# Patient Record
Sex: Female | Born: 1980 | Race: White | Hispanic: Yes | Marital: Married | Smoking: Never smoker
Health system: Southern US, Community
[De-identification: ages and names within clinical notes are randomized; demographics above are authoritative.]

## PROBLEM LIST (undated history)

## (undated) DIAGNOSIS — Z973 Presence of spectacles and contact lenses: Secondary | ICD-10-CM

## (undated) DIAGNOSIS — A64 Unspecified sexually transmitted disease: Secondary | ICD-10-CM

## (undated) DIAGNOSIS — D259 Leiomyoma of uterus, unspecified: Secondary | ICD-10-CM

## (undated) DIAGNOSIS — E041 Nontoxic single thyroid nodule: Secondary | ICD-10-CM

## (undated) DIAGNOSIS — E282 Polycystic ovarian syndrome: Secondary | ICD-10-CM

## (undated) DIAGNOSIS — Z803 Family history of malignant neoplasm of breast: Secondary | ICD-10-CM

## (undated) DIAGNOSIS — Z923 Personal history of irradiation: Secondary | ICD-10-CM

## (undated) DIAGNOSIS — F419 Anxiety disorder, unspecified: Secondary | ICD-10-CM

## (undated) DIAGNOSIS — R87619 Unspecified abnormal cytological findings in specimens from cervix uteri: Secondary | ICD-10-CM

## (undated) HISTORY — DX: Family history of malignant neoplasm of breast: Z80.3

## (undated) HISTORY — DX: Anxiety disorder, unspecified: F41.9

## (undated) HISTORY — DX: Unspecified sexually transmitted disease: A64

## (undated) HISTORY — DX: Polycystic ovarian syndrome: E28.2

## (undated) HISTORY — DX: Nontoxic single thyroid nodule: E04.1

## (undated) HISTORY — DX: Unspecified abnormal cytological findings in specimens from cervix uteri: R87.619

## (undated) HISTORY — PX: COLPOSCOPY: SHX161

## (undated) HISTORY — PX: CERVICAL BIOPSY  W/ LOOP ELECTRODE EXCISION: SUR135

---

## 1999-07-24 DIAGNOSIS — R87619 Unspecified abnormal cytological findings in specimens from cervix uteri: Secondary | ICD-10-CM

## 1999-07-24 HISTORY — DX: Unspecified abnormal cytological findings in specimens from cervix uteri: R87.619

## 2014-05-04 ENCOUNTER — Ambulatory Visit (INDEPENDENT_AMBULATORY_CARE_PROVIDER_SITE_OTHER): Payer: Managed Care, Other (non HMO) | Admitting: Gynecology

## 2014-05-04 ENCOUNTER — Encounter: Payer: Self-pay | Admitting: Gynecology

## 2014-05-04 VITALS — BP 110/70 | HR 60 | Resp 16 | Ht 66.0 in | Wt 143.0 lb

## 2014-05-04 DIAGNOSIS — Z124 Encounter for screening for malignant neoplasm of cervix: Secondary | ICD-10-CM

## 2014-05-04 DIAGNOSIS — Z0000 Encounter for general adult medical examination without abnormal findings: Secondary | ICD-10-CM

## 2014-05-04 DIAGNOSIS — Z01419 Encounter for gynecological examination (general) (routine) without abnormal findings: Secondary | ICD-10-CM

## 2014-05-04 LAB — POCT URINALYSIS DIPSTICK
LEUKOCYTES UA: NEGATIVE
PH UA: 5
Urobilinogen, UA: NEGATIVE

## 2014-05-04 NOTE — Progress Notes (Addendum)
33 y.o. Single Hispanic female   No obstetric history on file. here for annual exam. Pt is currently sexually active.  Pt recently moved from [facility].  Current partner for 22m, but not sexually active.  Pt had been on ocp in past, not interested in restarting.  Pt was diagnosed with PCOS based on PUS restarted ocp but stopped due to PMS.  Cycles have been regular now, Q35d, light flow.  Pt believes she gets monthly yeast infection before flow, but resolves shortly after, does not respond to otc rx.  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  Pt does not have pain now but reports a discharge.  Pt reports LEEP at 32, but pathology was normal tissue and all pap's normal since.  Patient's last menstrual period was 04/11/2014.          Sexually active: Yes.    The current method of family planning is condoms most of the time.    Exercising: Yes.    cardio,weights,yoga 3-4x/wk Last pap: 01/2013-WNL Abnormal pap: Age 56's had colpo bx and Leep done; normal ever since. Alcohol: 7-10 Drinks/wk Tobacco: no BSE: no   Labs: Work ; Urine: Negative     There are no preventive care reminders to display for this patient.  Family History  Problem Relation Age of Onset  . Diabetes Mother   . Heart attack Paternal Grandfather   . Hypertension Father     There are no active problems to display for this patient.   Past Medical History  Diagnosis Date  . Anxiety   . PCOS (polycystic ovarian syndrome)     Past Surgical History  Procedure Laterality Date  . Cervical biopsy  w/ loop electrode excision  Age 56     Paps have been normal eversince   . Colposcopy      Allergies: Penicillins  Current Outpatient Prescriptions  Medication Sig Dispense Refill  . ALPRAZolam (XANAX) 0.5 MG tablet as needed.      . Lactobacillus (ACIDOPHILUS PO) Take by mouth.       No current facility-administered medications for this visit.    ROS: Pertinent items are noted in HPI.  Exam:    BP 110/70  Resp  16  Wt 143 lb (64.864 kg)  LMP 04/11/2014 Weight change: @WEIGHTCHANGE @ Last 3 height recordings:  Ht Readings from Last 3 Encounters:  No data found for Ht   General appearance: alert, cooperative and appears stated age Head: Normocephalic, without obvious abnormality, atraumatic Neck: no adenopathy, no carotid bruit, no JVD, supple, symmetrical, trachea midline and thyroid not enlarged, symmetric, no tenderness/mass/nodules Lungs: clear to auscultation bilaterally Breasts: normal appearance, no masses or tenderness Heart: regular rate and rhythm, S1, S2 normal, no murmur, click, rub or gallop Abdomen: soft, non-tender; bowel sounds normal; no masses,  no organomegaly Extremities: extremities normal, atraumatic, no cyanosis or edema Skin: Skin color, texture, turgor normal. No rashes or lesions Lymph nodes: Cervical, supraclavicular, and axillary nodes normal. no inguinal nodes palpated Neurologic: Grossly normal   Pelvic: External genitalia:  normal escutcheon              Urethra: normal appearing urethra with no masses, tenderness or lesions              Bartholins and Skenes: Bartholin's, Urethra, Skene's normal                 Vagina: normal appearing vagina with normal color and discharge, no lesions  Cervix: scarring c/w LEEP              Pap taken: Yes.          Bimanual Exam:  Uterus:  uterus is normal size, shape, consistency and nontender                                      Adnexa:    normal adnexa in size, nontender and no masses                                      Rectovaginal: Confirms                                      Anus:  normal sphincter tone, no lesions       1. Laboratory exam ordered as part of routine general medical examination  - POCT Urinalysis Dipstick  2. Encounter for routine gynecological examination pap smear counseled on breast self exam, STD prevention, adequate intake of calcium and vitamin D, diet and exercise, no STD  concerns Anovulation after long use of ocp discussed, now with normal cycles, ok with condoms only Vaginal discharge progesterone affect-recommend rto when sx of yeast return return annually or prn   3. Screening for cervical cancer Guidelines reviewed PAP with HRHPV done   An After Visit Summary was printed and given to the patient.   Records received and reviewed-limited 2012 PAP NL +HR HPV 2013 PAP NL HPV NOT DONE 2014 PAP NL +HR HPV 18 NO RECORD RE C&B IF DONE PUS: 2 pedunculated fibroids 2.3x2.4 and 1.0x1.1 right fundal, multiple ovarian follicles Records scanned in

## 2014-05-05 LAB — IPS PAP TEST WITH HPV

## 2014-05-07 ENCOUNTER — Telehealth: Payer: Self-pay | Admitting: *Deleted

## 2014-05-07 NOTE — Telephone Encounter (Signed)
Left Message To Call Back  

## 2014-05-07 NOTE — Telephone Encounter (Signed)
Message copied by Alfonzo Feller on Fri May 07, 2014 12:55 PM ------      Message from: Elveria Rising      Created: Fri May 07, 2014 12:10 PM       Pap normal, no HPV, recall 2, pls inform no yeast noted on pap ------

## 2014-05-10 NOTE — Telephone Encounter (Signed)
Patient notified see result note 

## 2014-10-01 ENCOUNTER — Telehealth: Payer: Self-pay | Admitting: Obstetrics and Gynecology

## 2014-10-01 NOTE — Telephone Encounter (Signed)
Spoke with patient. She was last seen in our office 05/04/14 by Dr. Charlies Constable as new patient. Patient declined ocp's at this time, but would like to restart at this time. She has used Lo-ovral in the past and would like to continue but is open to other options for her as well.   Advised patient will need office visit to discuss with provider and instructions for new start OCP.Patient agreeable. Office visit scheduled for 10/05/14 at 1115.  Routing to provider for final review. Patient agreeable to disposition. Will close encounter

## 2014-10-01 NOTE — Telephone Encounter (Signed)
Pt seen in October. Would like to be on LoLo.

## 2014-10-05 ENCOUNTER — Encounter: Payer: Self-pay | Admitting: Certified Nurse Midwife

## 2014-10-05 ENCOUNTER — Ambulatory Visit (INDEPENDENT_AMBULATORY_CARE_PROVIDER_SITE_OTHER): Payer: Managed Care, Other (non HMO) | Admitting: Certified Nurse Midwife

## 2014-10-05 VITALS — BP 110/68 | HR 68 | Resp 16 | Ht 66.0 in | Wt 152.0 lb

## 2014-10-05 DIAGNOSIS — Z3009 Encounter for other general counseling and advice on contraception: Secondary | ICD-10-CM

## 2014-10-05 DIAGNOSIS — Z30011 Encounter for initial prescription of contraceptive pills: Secondary | ICD-10-CM

## 2014-10-05 MED ORDER — NORETHIN ACE-ETH ESTRAD-FE 1-20 MG-MCG(24) PO TABS: 1.0000 | ORAL_TABLET | Freq: Every day | ORAL | Status: DC

## 2014-10-05 NOTE — Patient Instructions (Signed)

## 2014-10-05 NOTE — Progress Notes (Addendum)
34 y.o. Single g0p0 Hispanic or Latino female presents to discuss need for contraception.  Previous methods used include oral contraceptives (estrogen/progesterone) and desires same. She is not interested in anything that requires removal. She has taken OCP in past without any problems. Now in stable relationship and both have exams and STD screening, so contraception now desired. Plans condom use also. Denies history of migraine headaches, personal or family history of DVT or stroke. Non smoker. Denies weight gain or problems with nausea with OCP use. Last aex here 05/04/14. No health issues today.   O:   Healthy WDWN female Affect normal             Orientation x 3 Pap smear 05/04/14 normal, history of LEEP at 19 with all normal pap smears   A: Healthy female with no contraindications to OCP use AEX up to date no health changes. Oral contraception desired, previous use with out problems  Plan:Discussed risks and benefits of OCP, warning signs and symptoms with use and need to advise if occurs. Discussed 3 month adjustment period after starting OCP. Patient aware. Plans Condoms use also. Discussed starting OCP on first day of cycle, instructions given. Rx Loestrin 24 Fe see order.  Rv 3 months for evaluation.   20 minutes of face to face tim regarding contraception options

## 2014-10-06 NOTE — Progress Notes (Signed)
Please document time.  Reviewed personally.  Felipa Emory, MD.

## 2015-03-03 ENCOUNTER — Telehealth: Payer: Self-pay | Admitting: Certified Nurse Midwife

## 2015-03-03 NOTE — Telephone Encounter (Addendum)
Patient is asking for three refills until her aex. Confirmed pharmacy with patient. Last seen 10/05/14.

## 2015-03-03 NOTE — Telephone Encounter (Signed)
Patient needs 3 months to last her until she comes in to see Ms. Debbie in [place]. She is aware that rx is at the pharmacy and she can get 3 months at a time. Patient said she will call them and if she has any problems or issues she will give [place] a call back.  Routed to provider for review, encounter closed.

## 2015-03-03 NOTE — Telephone Encounter (Addendum)
10/05/14 Loestrin 24 FE # 3 packs/2 rfs was sent to [facility]. Called and s/w associate from [facility] they do have rx and patient can get her rx at 3 months at a time. She was getting one month at a time.  Left Message To Call Back

## 2015-05-19 ENCOUNTER — Ambulatory Visit (INDEPENDENT_AMBULATORY_CARE_PROVIDER_SITE_OTHER): Payer: Managed Care, Other (non HMO) | Admitting: Certified Nurse Midwife

## 2015-05-19 ENCOUNTER — Encounter: Payer: Self-pay | Admitting: Certified Nurse Midwife

## 2015-05-19 VITALS — BP 108/70 | HR 70 | Resp 14 | Ht 66.0 in | Wt 145.0 lb

## 2015-05-19 DIAGNOSIS — Z01419 Encounter for gynecological examination (general) (routine) without abnormal findings: Secondary | ICD-10-CM

## 2015-05-19 NOTE — Patient Instructions (Signed)
General topics  Next pap or exam is  due in 1 year Take a Women's multivitamin Take 1200 mg. of calcium daily - prefer dietary If any concerns in interim to call back  Breast Self-Awareness Practicing breast self-awareness may pick up problems early, prevent significant medical complications, and possibly save your life. By practicing breast self-awareness, you can become familiar with how your breasts look and feel and if your breasts are changing. This allows you to notice changes early. It can also offer you some reassurance that your breast health is good. One way to learn what is normal for your breasts and whether your breasts are changing is to do a breast self-exam. If you find a lump or something that was not present in the past, it is best to contact your caregiver right away. Other findings that should be evaluated by your caregiver include nipple discharge, especially if it is bloody; skin changes or reddening; areas where the skin seems to be pulled in (retracted); or new lumps and bumps. Breast pain is seldom associated with cancer (malignancy), but should also be evaluated by a caregiver. BREAST SELF-EXAM The best time to examine your breasts is 5 7 days after your menstrual period is over.  ExitCare Patient Information 2013 ExitCare[employer] LLC.   Exercise to Stay Healthy Exercise helps you become and stay healthy. EXERCISE IDEAS AND TIPS Choose exercises that:  You enjoy.  Fit into your day. You do not need to exercise really hard to be healthy. You can do exercises at a slow or medium level and stay healthy. You can:  Stretch before and after working out.  Try yoga, Pilates, or tai chi.  Lift weights.  Walk fast, swim, jog, run, climb stairs, bicycle, dance, or rollerskate.  Take aerobic classes. Exercises that burn about 150 calories:  Running 1  miles in 15 minutes.  Playing volleyball for 45 to 60 minutes.  Washing and waxing a car for 45 to 60  minutes.  Playing touch football for 45 minutes.  Walking 1  miles in 35 minutes.  Pushing a stroller 1  miles in 30 minutes.  Playing basketball for 30 minutes.  Raking leaves for 30 minutes.  Bicycling 5 miles in 30 minutes.  Walking 2 miles in 30 minutes.  Dancing for 30 minutes.  Shoveling snow for 15 minutes.  Swimming laps for 20 minutes.  Walking up stairs for 15 minutes.  Bicycling 4 miles in 15 minutes.  Gardening for 30 to 45 minutes.  Jumping rope for 15 minutes.  Washing windows or floors for 45 to 60 minutes. Document Released: 08/11/2010 Document Revised: 10/01/2011 Document Reviewed: 08/11/2010 ExitCare Patient Information 2013 [employer]Care[employer].   Other topics ( that may be useful information):    Sexually Transmitted Disease Sexually transmitted disease (STD) refers to any infection that is passed from person to person during sexual activity. This may happen by way of saliva, semen, blood, vaginal mucus, or urine. Common STDs include:  Gonorrhea.  Chlamydia.  Syphilis.  HIV/AIDS.  Genital herpes.  Hepatitis B and C.  Trichomonas.  Human papillomavirus (HPV).  Pubic lice. CAUSES  An STD may be spread by bacteria, virus, or parasite. A person can get an STD by:  Sexual intercourse with an infected person.  Sharing sex toys with an infected person.  Sharing needles with an infected person.  Having intimate contact with the genitals, mouth, or rectal areas of an infected person. SYMPTOMS  Some people may not have any symptoms, but   they can still pass the infection to others. Different STDs have different symptoms. Symptoms include:  Painful or bloody urination.  Pain in the pelvis, abdomen, vagina, anus, throat, or eyes.  Skin rash, itching, irritation, growths, or sores (lesions). These usually occur in the genital or anal area.  Abnormal vaginal discharge.  Penile discharge in men.  Soft, flesh-colored skin growths in the  genital or anal area.  Fever.  Pain or bleeding during sexual intercourse.  Swollen glands in the groin area.  Yellow skin and eyes (jaundice). This is seen with hepatitis. DIAGNOSIS  To make a diagnosis, your caregiver may:  Take a medical history.  Perform a physical exam.  Take a specimen (culture) to be examined.  Examine a sample of discharge under a microscope.  Perform blood test TREATMENT   Chlamydia, gonorrhea, trichomonas, and syphilis can be cured with antibiotic medicine.  Genital herpes, hepatitis, and HIV can be treated, but not cured, with prescribed medicines. The medicines will lessen the symptoms.  Genital warts from HPV can be treated with medicine or by freezing, burning (electrocautery), or surgery. Warts may come back.  HPV is a virus and cannot be cured with medicine or surgery.However, abnormal areas may be followed very closely by your caregiver and may be removed from the cervix, vagina, or vulva through office procedures or surgery. If your diagnosis is confirmed, your recent sexual partners need treatment. This is true even if they are symptom-free or have a negative culture or evaluation. They should not have sex until their caregiver says it is okay. HOME CARE INSTRUCTIONS  All sexual partners should be informed, tested, and treated for all STDs.  Take your antibiotics as directed. Finish them even if you start to feel better.  Only take over-the-counter or prescription medicines for pain, discomfort, or fever as directed by your caregiver.  Rest.  Eat a balanced diet and drink enough fluids to keep your urine clear or pale yellow.  Do not have sex until treatment is completed and you have followed up with your caregiver. STDs should be checked after treatment.  Keep all follow-up appointments, Pap tests, and blood tests as directed by your caregiver.  Only use latex condoms and water-soluble lubricants during sexual activity. Do not use  petroleum jelly or oils.  Avoid alcohol and illegal drugs.  Get vaccinated for HPV and hepatitis. If you have not received these vaccines in the past, talk to your caregiver about whether one or both might be right for you.  Avoid risky sex practices that can break the skin. The only way to avoid getting an STD is to avoid all sexual activity.Latex condoms and dental dams (for oral sex) will help lessen the risk of getting an STD, but will not completely eliminate the risk. SEEK MEDICAL CARE IF:   You have a fever.  You have any new or worsening symptoms. Document Released: 09/29/2002 Document Revised: 10/01/2011 Document Reviewed: 10/06/2010 [facility] Patient Information 2013 [employer].    Domestic Abuse You are being battered or abused if someone close to you hits, pushes, or physically hurts you in any way. You also are being abused if you are forced into activities. You are being sexually abused if you are forced to have sexual contact of any kind. You are being emotionally abused if you are made to feel worthless or if you are constantly threatened. It is important to remember that help is available. No one has the right to abuse you. PREVENTION OF FURTHER  ABUSE  Learn the warning signs of danger. This varies with situations but may include: the use of alcohol, threats, isolation from friends and family, or forced sexual contact. Leave if you feel that violence is going to occur.  If you are attacked or beaten, report it to the police so the abuse is documented. You do not have to press charges. The police can protect you while you or the attackers are leaving. Get the officer's name and badge number and a copy of the report.  Find someone you can trust and tell them what is happening to you: your caregiver, a nurse, clergy member, close friend or family member. Feeling ashamed is natural, but remember that you have done nothing wrong. No one deserves abuse. Document Released:  07/06/2000 Document Revised: 10/01/2011 Document Reviewed: 09/14/2010 ExitCare Patient Information 2013 [employer]Care[employer] LLC.    How Much is Too Much Alcohol? Drinking too much alcohol can cause injury, accidents, and health problems. These types of problems can include:   Car crashes.  Falls.  Family fighting (domestic violence).  Drowning.  Fights.  Injuries.  Burns.  Damage to certain organs.  Having a baby with birth defects. ONE DRINK CAN BE TOO MUCH WHEN YOU ARE:  Working.  Pregnant or breastfeeding.  Taking medicines. Ask your doctor.  Driving or planning to drive. If you or someone you know has a drinking problem, get help from a doctor.  Document Released: 05/05/2009 Document Revised: 10/01/2011 Document Reviewed: 05/05/2009 ExitCare Patient Information 2013 ExitCare[employer] LLC.   Smoking Hazards Smoking cigarettes is extremely bad for your health. Tobacco smoke has over 200 known poisons in it. There are over 60 chemicals in tobacco smoke that cause cancer. Some of the chemicals found in cigarette smoke include:   Cyanide.  Benzene.  Formaldehyde.  Methanol (wood alcohol).  Acetylene (fuel used in welding torches).  Ammonia. Cigarette smoke also contains the poisonous gases nitrogen oxide and carbon monoxide.  Cigarette smokers have an increased risk of many serious medical problems and Smoking causes approximately:  90% of all lung cancer deaths in men.  80% of all lung cancer deaths in women.  90% of deaths from chronic obstructive lung disease. Compared with nonsmokers, smoking increases the risk of:  Coronary heart disease by 2 to 4 times.  Stroke by 2 to 4 times.  Men developing lung cancer by 23 times.  Women developing lung cancer by 13 times.  Dying from chronic obstructive lung diseases by 12 times.  . Smoking is the most preventable cause of death and disease in our society.  WHY IS SMOKING ADDICTIVE?  Nicotine is the chemical  agent in tobacco that is capable of causing addiction or dependence.  When you smoke and inhale, nicotine is absorbed rapidly into the bloodstream through your lungs. Nicotine absorbed through the lungs is capable of creating a powerful addiction. Both inhaled and non-inhaled nicotine may be addictive.  Addiction studies of cigarettes and spit tobacco show that addiction to nicotine occurs mainly during the teen years, when young people begin using tobacco products. WHAT ARE THE BENEFITS OF QUITTING?  There are many health benefits to quitting smoking.   Likelihood of developing cancer and heart disease decreases. Health improvements are seen almost immediately.  Blood pressure, pulse rate, and breathing patterns start returning to normal soon after quitting. QUITTING SMOKING   American Lung Association - 1-800-LUNGUSA  American Cancer Society - 1-800-ACS-2345 Document Released: 08/16/2004 Document Revised: 10/01/2011 Document Reviewed: 04/20/2009 ExitCare Patient Information 2013 ExitCare,   LLC.   Stress Management Stress is a state of physical or mental tension that often results from changes in your life or normal routine. Some common causes of stress are:  Death of a loved one.  Injuries or severe illnesses.  Getting fired or changing jobs.  Moving into a new home. Other causes may be:  Sexual problems.  Business or financial losses.  Taking on a large debt.  Regular conflict with someone at home or at work.  Constant tiredness from lack of sleep. It is not just bad things that are stressful. It may be stressful to:  Win the lottery.  Get married.  Buy a new car. The amount of stress that can be easily tolerated varies from person to person. Changes generally cause stress, regardless of the types of change. Too much stress can affect your health. It may lead to physical or emotional problems. Too little stress (boredom) may also become stressful. SUGGESTIONS TO  REDUCE STRESS:  Talk things over with your family and friends. It often is helpful to share your concerns and worries. If you feel your problem is serious, you may want to get help from a professional counselor.  Consider your problems one at a time instead of lumping them all together. Trying to take care of everything at once may seem impossible. List all the things you need to do and then start with the most important one. Set a goal to accomplish 2 or 3 things each day. If you expect to do too many in a single day you will naturally fail, causing you to feel even more stressed.  Do not use alcohol or drugs to relieve stress. Although you may feel better for a short time, they do not remove the problems that caused the stress. They can also be habit forming.  Exercise regularly - at least 3 times per week. Physical exercise can help to relieve that "uptight" feeling and will relax you.  The shortest distance between despair and hope is often a good night's sleep.  Go to bed and get up on time allowing yourself time for appointments without being rushed.  Take a short "time-out" period from any stressful situation that occurs during the day. Close your eyes and take some deep breaths. Starting with the muscles in your face, tense them, hold it for a few seconds, then relax. Repeat this with the muscles in your neck, shoulders, hand, stomach, back and legs.  Take good care of yourself. Eat a balanced diet and get plenty of rest.  Schedule time for having fun. Take a break from your daily routine to relax. HOME CARE INSTRUCTIONS   Call if you feel overwhelmed by your problems and feel you can no longer manage them on your own.  Return immediately if you feel like hurting yourself or someone else. Document Released: 01/02/2001 Document Revised: 10/01/2011 Document Reviewed: 08/25/2007 ExitCare Patient Information 2013 ExitCare, LLC.   

## 2015-05-19 NOTE — Progress Notes (Signed)
34 y.o. G0P0000 Single  Hispanic Fe here for annual exam. Periods normal, no issues. Has stopped OCP due to not sexually active now and did not like the way it may her feel. Sees PCP prn. Period is due now, so some PMS symptoms. Has labs with work. No health issues today  Patient's last menstrual period was 04/15/2015.          Sexually active: No.  The current method of family planning is condoms Every time.    Exercising: Yes.    Cardio, yoga, 3 x weekly Smoker:  no  Health Maintenance: Pap:  05/04/14 Neg. HR HPV:neg MMG:  Never Self Breast Check: No TDaP:  2013  Labs: PCP   reports that she has never smoked. She has never used smokeless tobacco. She reports that she drinks about 3.5 - 5.0 oz of alcohol per week. She reports that she does not use illicit drugs.  Past Medical History  Diagnosis Date  . Anxiety   . PCOS (polycystic ovarian syndrome)   . Abnormal Pap smear of cervix 2001    Past Surgical History  Procedure Laterality Date  . Cervical biopsy  w/ loop electrode excision  Age 51     Paps have been normal eversince   . Colposcopy      Current Outpatient Prescriptions  Medication Sig Dispense Refill  . ALPRAZolam (XANAX) 0.5 MG tablet as needed.    . Lactobacillus (ACIDOPHILUS PO) Take by mouth as needed.      No current facility-administered medications for this visit.    Family History  Problem Relation Age of Onset  . Diabetes Mother   . Heart attack Paternal Grandfather   . Hypertension Father     ROS:  Pertinent items are noted in HPI.  Otherwise, a comprehensive ROS was negative.  Exam:   BP 108/70 mmHg  Pulse 70  Resp 14  Ht 5\' 6"  (1.676 m)  Wt 145 lb (65.772 kg)  BMI 23.41 kg/m2  LMP 04/15/2015 Height: 5\' 6"  (167.6 cm) Ht Readings from Last 3 Encounters:  05/19/15 5\' 6"  (1.676 m)  10/05/14 5\' 6"  (1.676 m)  05/04/14 5\' 6"  (1.676 m)    General appearance: alert, cooperative and appears stated age Head: Normocephalic, without obvious  abnormality, atraumatic Neck: no adenopathy, supple, symmetrical, trachea midline and thyroid normal to inspection and palpation Lungs: clear to auscultation bilaterally Breasts: normal appearance, no masses or tenderness, No nipple retraction or dimpling, No nipple discharge or bleeding, No axillary or supraclavicular adenopathy Heart: regular rate and rhythm Abdomen: soft, non-tender; no masses,  no organomegaly Extremities: extremities normal, atraumatic, no cyanosis or edema Skin: Skin color, texture, turgor normal. No rashes or lesions Lymph nodes: Cervical, supraclavicular, and axillary nodes normal. No abnormal inguinal nodes palpated Neurologic: Grossly normal   Pelvic: External genitalia:  no lesions              Urethra:  normal appearing urethra with no masses, tenderness or lesions              Bartholin's and Skene's: normal                 Vagina: normal appearing vagina with normal color and discharge, no lesions              Cervix: normal,non tender,no lesions              Pap taken: No. Bimanual Exam:  Uterus:  normal size, contour, position, consistency, mobility, non-tender and  mid position              Adnexa: normal adnexa and no mass, fullness, tenderness               Rectovaginal: Confirms               Anus:  normal sphincter tone, no lesions  Chaperone present: yes   A:  Well Woman with normal exam  Contraception none desired    P:   Reviewed health and wellness pertinent to exam  Discussed possible Nuvaring use if needs contraception again  Pap smear as above not taken   counseled on breast self exam, STD prevention, HIV risk factors and prevention, adequate intake of calcium and vitamin D, diet and exercise  return annually or prn  An After Visit Summary was printed and given to the patient.

## 2015-05-22 NOTE — Progress Notes (Signed)
Reviewed personally.  M. Suzanne Ahren Pettinger, MD.  

## 2016-04-10 ENCOUNTER — Ambulatory Visit: Payer: Managed Care, Other (non HMO) | Admitting: Certified Nurse Midwife

## 2016-04-18 ENCOUNTER — Encounter: Payer: Self-pay | Admitting: Certified Nurse Midwife

## 2016-04-18 ENCOUNTER — Ambulatory Visit: Payer: Managed Care, Other (non HMO) | Admitting: Certified Nurse Midwife

## 2016-04-18 NOTE — Progress Notes (Deleted)
35 y.o. G0P0000 Single  Hispanic Fe here for annual exam.    No LMP recorded.          Sexually active: {yes no:314532}  The current method of family planning is {contraception:315051}.    Exercising: {yes no:314532}  {types:19826} Smoker:  {YES NO:22349}  Health Maintenance: Pap:  05-04-14 neg MMG: none Colonoscopy:  none BMD:   none TDaP:  2013 Shingles: no Pneumonia: no Hep C and HIV: *** Labs: *** Self breast exam:   reports that she has never smoked. She has never used smokeless tobacco. She reports that she drinks about 3.5 - 5.0 oz of alcohol per week . She reports that she does not use drugs.  Past Medical History:  Diagnosis Date  . Abnormal Pap smear of cervix 2001  . Anxiety   . PCOS (polycystic ovarian syndrome)     Past Surgical History:  Procedure Laterality Date  . CERVICAL BIOPSY  W/ LOOP ELECTRODE EXCISION  Age 3    Paps have been normal eversince   . COLPOSCOPY      Current Outpatient Prescriptions  Medication Sig Dispense Refill  . ALPRAZolam (XANAX) 0.5 MG tablet as needed.    . Lactobacillus (ACIDOPHILUS PO) Take by mouth as needed.      No current facility-administered medications for this visit.     Family History  Problem Relation Age of Onset  . Diabetes Mother   . Heart attack Paternal Grandfather   . Hypertension Father     ROS:  Pertinent items are noted in HPI.  Otherwise, a comprehensive ROS was negative.  Exam:   There were no vitals taken for this visit.   Ht Readings from Last 3 Encounters:  05/19/15 5\' 6"  (1.676 m)  10/05/14 5\' 6"  (1.676 m)  05/04/14 5\' 6"  (1.676 m)    General appearance: alert, cooperative and appears stated age Head: Normocephalic, without obvious abnormality, atraumatic Neck: no adenopathy, supple, symmetrical, trachea midline and thyroid {EXAM; THYROID:18604} Lungs: clear to auscultation bilaterally Breasts: {Exam; breast:13139::"normal appearance, no masses or tenderness"} Heart: regular rate  and rhythm Abdomen: soft, non-tender; no masses,  no organomegaly Extremities: extremities normal, atraumatic, no cyanosis or edema Skin: Skin color, texture, turgor normal. No rashes or lesions Lymph nodes: Cervical, supraclavicular, and axillary nodes normal. No abnormal inguinal nodes palpated Neurologic: Grossly normal   Pelvic: External genitalia:  no lesions              Urethra:  normal appearing urethra with no masses, tenderness or lesions              Bartholin's and Skene's: normal                 Vagina: normal appearing vagina with normal color and discharge, no lesions              Cervix: {exam; cervix:14595}              Pap taken: {yes no:314532} Bimanual Exam:  Uterus:  {exam; uterus:12215}              Adnexa: {exam; adnexa:12223}               Rectovaginal: Confirms               Anus:  normal sphincter tone, no lesions  Chaperone present: ***  A:  Well Woman with normal exam  P:   Reviewed health and wellness pertinent to exam  Pap smear as above  {plan;  gyn:5269::"mammogram","pap smear","return annually or prn"}  An After Visit Summary was printed and given to the patient.

## 2016-05-02 ENCOUNTER — Ambulatory Visit: Payer: Managed Care, Other (non HMO) | Admitting: Certified Nurse Midwife

## 2016-06-05 NOTE — Progress Notes (Signed)
35 y.o. G0P0000 Single  Hispanic Fe here for annual exam.  Periods normal, no issues. 28-35 day cycle. PMS is so much better off OCP,  would like to be on something with no hormones and long term . Interested in IUD. No partner change, GC/chlamydia screening only if needed for IUD insertion. Sees PCP for aex and labs, all normal per patient. No health issues today.  Patient's last menstrual period was 05/12/2016 (exact date).          Sexually active: Yes.    The current method of family planning is condoms most of the time.    Exercising: Yes.    cardio & yoga Smoker:  no  Health Maintenance: Pap:  05-04-14 neg HPV HR neg , hx of LEEP MMG:  none Colonoscopy:  none BMD:   none TDaP:  2013 Shingles: no Pneumonia: no Hep C and HIV: HIV 4/17 neg Labs: pcp Self breast exam: not done   reports that she has never smoked. She has never used smokeless tobacco. She reports that she drinks about 3.5 - 5.0 oz of alcohol per week . She reports that she does not use drugs.  Past Medical History:  Diagnosis Date  . Abnormal Pap smear of cervix 2001  . Anxiety   . PCOS (polycystic ovarian syndrome)     Past Surgical History:  Procedure Laterality Date  . CERVICAL BIOPSY  W/ LOOP ELECTRODE EXCISION  Age 49    Paps have been normal eversince   . COLPOSCOPY      Current Outpatient Prescriptions  Medication Sig Dispense Refill  . ALPRAZolam (XANAX) 0.5 MG tablet as needed.    Marland Kitchen BIOTIN PO Take by mouth.    . Multiple Vitamins-Minerals (MULTIVITAMIN PO) Take by mouth daily.    . Probiotic Product (PROBIOTIC PO) Take by mouth daily.     No current facility-administered medications for this visit.     Family History  Problem Relation Age of Onset  . Diabetes Mother   . Heart attack Paternal Grandfather   . Hypertension Father     ROS:  Pertinent items are noted in HPI.  Otherwise, a comprehensive ROS was negative.  Exam:   BP 102/64   Pulse 68   Resp 16   Ht 5' 6.25" (1.683 m)    Wt 146 lb (66.2 kg)   LMP 05/12/2016 (Exact Date)   BMI 23.39 kg/m  Height: 5' 6.25" (168.3 cm) Ht Readings from Last 3 Encounters:  06/06/16 5' 6.25" (1.683 m)  05/19/15 5\' 6"  (1.676 m)  10/05/14 5\' 6"  (1.676 m)    General appearance: alert, cooperative and appears stated age Head: Normocephalic, without obvious abnormality, atraumatic Neck: no adenopathy, supple, symmetrical, trachea midline and thyroid normal to inspection and palpation Lungs: clear to auscultation bilaterally Breasts: normal appearance, no masses or tenderness, No nipple retraction or dimpling, No nipple discharge or bleeding, No axillary or supraclavicular adenopathy Heart: regular rate and rhythm Abdomen: soft, non-tender; no masses,  no organomegaly Extremities: extremities normal, atraumatic, no cyanosis or edema Skin: Skin color, texture, turgor normal. No rashes or lesions Lymph nodes: Cervical, supraclavicular, and axillary nodes normal. No abnormal inguinal nodes palpated Neurologic: Grossly normal   Pelvic: External genitalia:  no lesions              Urethra:  normal appearing urethra with no masses, tenderness or lesions              Bartholin's and Skene's: normal  Vagina: normal appearing vagina with normal color and discharge, no lesions              Cervix: no bleeding following Pap, no cervical motion tenderness and no lesions              Pap taken: Yes.   Bimanual Exam:  Uterus:  normal size, contour, position, consistency, mobility, non-tender              Adnexa: normal adnexa and no mass, fullness, tenderness               Rectovaginal: Confirms               Anus:  normal sphincter tone, no lesions  Chaperone present: yes  A:  Well Woman with normal exam  Contraception condoms interested in IUD  Screening labs  P:   Reviewed health and wellness pertinent to exam  Discussed risks/benefits/insertion/removal and bleeding expectations with Paragard IUD. Discussed bleeding  profile with Mirena. Questions addressed. Discussed will need Cytotec the night before and day of insertion to soften cervix for ease of insertion. Will need to inserted on period day 1-5. Questions addressed. Given information brochure and insertion info to check on. Patient will call if she decides to go forward with IUD. Needs consistent condom use prior to insertion.  Lab: Gc,Chlamydia  Pap smear as above with HPVHR   counseled on breast self exam, family planning choices, adequate intake of calcium and vitamin D, diet and exercise  return annually or prn  An After Visit Summary was printed and given to the patient.

## 2016-06-06 ENCOUNTER — Ambulatory Visit (INDEPENDENT_AMBULATORY_CARE_PROVIDER_SITE_OTHER): Payer: 59 | Admitting: Certified Nurse Midwife

## 2016-06-06 ENCOUNTER — Encounter: Payer: Self-pay | Admitting: Certified Nurse Midwife

## 2016-06-06 VITALS — BP 102/64 | HR 68 | Resp 16 | Ht 66.25 in | Wt 146.0 lb

## 2016-06-06 DIAGNOSIS — Z0000 Encounter for general adult medical examination without abnormal findings: Secondary | ICD-10-CM | POA: Diagnosis not present

## 2016-06-06 DIAGNOSIS — Z124 Encounter for screening for malignant neoplasm of cervix: Secondary | ICD-10-CM

## 2016-06-06 DIAGNOSIS — Z01419 Encounter for gynecological examination (general) (routine) without abnormal findings: Secondary | ICD-10-CM

## 2016-06-06 NOTE — Patient Instructions (Signed)

## 2016-06-07 ENCOUNTER — Telehealth: Payer: Self-pay | Admitting: Certified Nurse Midwife

## 2016-06-07 MED ORDER — MISOPROSTOL 200 MCG PO TABS: ORAL_TABLET | ORAL | 0 refills | Status: DC

## 2016-06-07 NOTE — Telephone Encounter (Signed)
Patient is nulliparous and will need MD insertion and Cytotec evening before and day of

## 2016-06-07 NOTE — Addendum Note (Signed)
Addended by: Burnice Logan on: 06/07/2016 12:25 PM   Modules accepted: Orders

## 2016-06-07 NOTE — Telephone Encounter (Signed)
Routing to Deborah Leonard CNM as FYI. Will close encounter. 

## 2016-06-07 NOTE — Telephone Encounter (Signed)
Spoke with patient, advised as seen below. Reviewed- Cytotec 200 mcg  Pace 1 tablet in vagina the night before procedure. Place 1 tablet in vagina the morning of the procedure. Motrin 800 mg with food and water one hour before procedure. Eat and hydrate well. Patient verbalizes understanding and is agreeable.  Routing to provider for final review. Patient is agreeable to disposition. Will close encounter.

## 2016-06-07 NOTE — Telephone Encounter (Signed)
Patient called and said, "I called my insurance company yesterday about Paragard after seeing Debbi for my visit. I just wanted to call and let the nurse know I am interested in proceeding with this. I will call the office on the first day of my next cycle to schedule an appointment."

## 2016-06-08 LAB — IPS PAP TEST WITH HPV

## 2016-06-08 LAB — IPS N GONORRHOEA AND CHLAMYDIA BY PCR

## 2016-06-12 NOTE — Progress Notes (Signed)
Encounter reviewed Diara Chaudhari, MD   

## 2016-06-18 ENCOUNTER — Telehealth: Payer: Self-pay | Admitting: Certified Nurse Midwife

## 2016-06-18 DIAGNOSIS — Z30430 Encounter for insertion of intrauterine contraceptive device: Secondary | ICD-10-CM

## 2016-06-18 NOTE — Telephone Encounter (Signed)
Left message to call Coston Mandato at [phone]. 

## 2016-06-18 NOTE — Telephone Encounter (Signed)
Patient's cycle came on Saturday and she's calling to schedule iud insertion.

## 2016-06-18 NOTE — Telephone Encounter (Signed)
Spoke with patient. Patient started her menses on 06/16/2016 and would like to schedule Paragard insertion. Appointment scheduled for 06/19/2016 at 1:15 pm with Dr.Jertson. Patient is agreeable to date and time. Pre procedure instructions given.  Motrin instructions given. Motrin=Advil=Ibuprofen, 800 mg one hour before appointment. Eat a meal and hydrate well before appointment. Cytotec instructions reviewed. Cytotec previously sent in by Melvia Heaps CNM. Patient verbalizes understanding. Order placed for precert.  Routing to provider for final review. Patient agreeable to disposition. Will close encounter.

## 2016-06-19 ENCOUNTER — Ambulatory Visit (INDEPENDENT_AMBULATORY_CARE_PROVIDER_SITE_OTHER): Payer: 59 | Admitting: Obstetrics and Gynecology

## 2016-06-19 ENCOUNTER — Ambulatory Visit: Payer: 59 | Admitting: Obstetrics and Gynecology

## 2016-06-19 ENCOUNTER — Encounter: Payer: Self-pay | Admitting: Obstetrics and Gynecology

## 2016-06-19 VITALS — BP 100/60 | HR 60 | Resp 14 | Wt 150.0 lb

## 2016-06-19 DIAGNOSIS — Z01812 Encounter for preprocedural laboratory examination: Secondary | ICD-10-CM

## 2016-06-19 DIAGNOSIS — Z30430 Encounter for insertion of intrauterine contraceptive device: Secondary | ICD-10-CM | POA: Diagnosis not present

## 2016-06-19 LAB — POCT URINE PREGNANCY: Preg Test, Ur: NEGATIVE

## 2016-06-19 NOTE — Patient Instructions (Signed)

## 2016-06-19 NOTE — Progress Notes (Signed)
GYNECOLOGY  VISIT   HPI: 35 y.o.   Single  Hispanic  female   G0P0000 with Patient's last menstrual period was 06/16/2016.   here for Paragard IUD insertion    Menses q month x 4-5 days. Saturates a super tampon in 6 hours. No cramps. Off the pill x 2 years, feels better off the pill. Recent negative genprobe.   GYNECOLOGIC HISTORY: Patient's last menstrual period was 06/16/2016. Contraception:none  Menopausal hormone therapy: none         OB History    Gravida Para Term Preterm AB Living   0 0 0 0 0 0   SAB TAB Ectopic Multiple Live Births   0 0 0 0           There are no active problems to display for this patient.   Past Medical History:  Diagnosis Date  . Abnormal Pap smear of cervix 2001  . Anxiety   . PCOS (polycystic ovarian syndrome)     Past Surgical History:  Procedure Laterality Date  . CERVICAL BIOPSY  W/ LOOP ELECTRODE EXCISION  Age 23    Paps have been normal eversince   . COLPOSCOPY      Current Outpatient Prescriptions  Medication Sig Dispense Refill  . ALPRAZolam (XANAX) 0.5 MG tablet as needed.    Marland Kitchen BIOTIN PO Take by mouth.    . misoprostol (CYTOTEC) 200 MCG tablet Place on tablet vaginally the night before procedure. Place 1 tablet morning of procedure. 2 tablet 0  . Multiple Vitamins-Minerals (MULTIVITAMIN PO) Take by mouth daily.    . Probiotic Product (PROBIOTIC PO) Take by mouth daily.     No current facility-administered medications for this visit.      ALLERGIES: Penicillins  Family History  Problem Relation Age of Onset  . Diabetes Mother   . Heart attack Paternal Grandfather   . Hypertension Father     Social History   Social History  . Marital status: Single    Spouse name: N/A  . Number of children: N/A  . Years of education: N/A   Occupational History  . Not on file.   Social History Main Topics  . Smoking status: Never Smoker  . Smokeless tobacco: Never Used  . Alcohol use 3.5 - 5.0 oz/week    7 - 10 Standard  drinks or equivalent per week  . Drug use: No  . Sexual activity: Yes    Partners: Male    Birth control/ protection: Condom   Other Topics Concern  . Not on file   Social History Narrative  . No narrative on file    Review of Systems  Constitutional: Negative.   HENT: Negative.   Eyes: Negative.   Respiratory: Negative.   Cardiovascular: Negative.   Gastrointestinal: Negative.   Genitourinary: Negative.   Musculoskeletal: Negative.   Skin: Negative.   Neurological: Negative.   Endo/Heme/Allergies: Negative.   Psychiatric/Behavioral: Negative.     PHYSICAL EXAMINATION:    BP 100/60 (BP Location: Right Arm, Patient Position: Sitting, Cuff Size: Normal)   Pulse 60   Resp 14   Wt 150 lb (68 kg)   LMP 06/16/2016   BMI 24.03 kg/m     General appearance: alert, cooperative and appears stated age   Pelvic: External genitalia:  no lesions              Urethra:  normal appearing urethra with no masses, tenderness or lesions  Bartholins and Skenes: normal                 Vagina: normal appearing vagina with normal color and discharge, no lesions              Cervix: no lesions              Bimanual Exam:  Uterus:  normal size, contour, position, consistency, mobility, non-tender and anteverted              Adnexa: no mass, fullness, tenderness                The risks of the Paragard IUD were reviewed with the patient, including infection, abnormal bleeding and uterine perfortion. Consent was signed.  A speculum was placed in the vagina, the cervix was cleansed with betadine. A tenaculum was placed on the cervix, the uterus sounded to 7 cm. The cervix was dilated to a 4 hagar dilator  The Paragard IUD was inserted without difficulty. The string were cut to 3-4 cm. The tenaculum was removed.  The patient tolerated the procedure well.     Chaperone was present for exam.  ASSESSMENT Contraception, desires paragard IUD    PLAN Paragard IUD placed F/U  in 1 month Call with any concerns   An After Visit Summary was printed and given to the patient.  CC: Evalee Mutton, CNM

## 2016-07-25 ENCOUNTER — Ambulatory Visit: Payer: 59 | Admitting: Obstetrics and Gynecology

## 2016-07-25 ENCOUNTER — Encounter: Payer: Self-pay | Admitting: Obstetrics and Gynecology

## 2016-07-25 VITALS — BP 110/68 | HR 72 | Resp 16 | Wt 153.0 lb

## 2016-07-25 DIAGNOSIS — Z30431 Encounter for routine checking of intrauterine contraceptive device: Secondary | ICD-10-CM

## 2016-07-25 NOTE — Progress Notes (Signed)
GYNECOLOGY  VISIT   HPI: 36 y.o.   Single  Hispanic  female   G0P0000 with Patient's last menstrual period was 07/19/2016.   here for a paragard IUD check. She bleed for about 10 days after IUD insertion. She has had one cycle since insertion. Going through a super + tampon in 1/2 a day. Mild cramps. Sexually active, no pain.  GYNECOLOGIC HISTORY: Patient's last menstrual period was 07/19/2016. Contraception:IUD Menopausal hormone therapy: n/a        OB History    Gravida Para Term Preterm AB Living   0 0 0 0 0 0   SAB TAB Ectopic Multiple Live Births   0 0 0 0           There are no active problems to display for this patient.   Past Medical History:  Diagnosis Date  . Abnormal Pap smear of cervix 2001  . Anxiety   . PCOS (polycystic ovarian syndrome)     Past Surgical History:  Procedure Laterality Date  . CERVICAL BIOPSY  W/ LOOP ELECTRODE EXCISION  Age 47    Paps have been normal eversince   . COLPOSCOPY      Current Outpatient Prescriptions  Medication Sig Dispense Refill  . ALPRAZolam (XANAX) 0.5 MG tablet as needed.    Marland Kitchen BIOTIN PO Take by mouth.    . Multiple Vitamins-Minerals (MULTIVITAMIN PO) Take by mouth daily.    . Probiotic Product (PROBIOTIC PO) Take by mouth daily.     No current facility-administered medications for this visit.      ALLERGIES: Penicillins  Family History  Problem Relation Age of Onset  . Diabetes Mother   . Heart attack Paternal Grandfather   . Hypertension Father     Social History   Social History  . Marital status: Single    Spouse name: N/A  . Number of children: N/A  . Years of education: N/A   Occupational History  . Not on file.   Social History Main Topics  . Smoking status: Never Smoker  . Smokeless tobacco: Never Used  . Alcohol use 3.5 - 5.0 oz/week    7 - 10 Standard drinks or equivalent per week  . Drug use: No  . Sexual activity: Yes    Partners: Male    Birth control/ protection: Condom    Other Topics Concern  . Not on file   Social History Narrative  . No narrative on file    Review of Systems  Constitutional: Negative.   HENT: Negative.   Eyes: Negative.   Respiratory: Negative.   Cardiovascular: Negative.   Gastrointestinal: Negative.   Genitourinary: Negative.   Musculoskeletal: Negative.   Skin: Negative.   Neurological: Negative.   Endo/Heme/Allergies: Negative.   Psychiatric/Behavioral: Negative.     PHYSICAL EXAMINATION:    BP 110/68 (BP Location: Right Arm, Patient Position: Sitting, Cuff Size: Normal)   Pulse 72   Resp 16   Wt 153 lb (69.4 kg)   LMP 07/19/2016   BMI 24.51 kg/m     General appearance: alert, cooperative and appears stated age  Pelvic: External genitalia:  no lesions              Urethra:  normal appearing urethra with no masses, tenderness or lesions              Bartholins and Skenes: normal                 Vagina: normal appearing vagina  with normal color and discharge, no lesions              Cervix:no lesions, IUD string 3 cm              Bimanual Exam:  Uterus:  Normal sized, mobile, not tender              Adnexa: no mass, fullness, tenderness               Chaperone was present for exam.  ASSESSMENT Paragard IUD check, doing well    PLAN Routine f/u   An After Visit Summary was printed and given to the patient.

## 2016-11-26 ENCOUNTER — Telehealth: Payer: Self-pay | Admitting: Obstetrics and Gynecology

## 2016-11-26 DIAGNOSIS — Z30432 Encounter for removal of intrauterine contraceptive device: Secondary | ICD-10-CM

## 2016-11-26 NOTE — Telephone Encounter (Signed)
Spoke with patient. Patient states that she has been having ongoing bleeding since having Paragard placed on 06/19/2016. States she goes through a super plus tampon in about half a day. Denies having any heavy bleeding. Has mild cramping. Would like to have IUD removed. Appointment scheduled for 11/28/2016 at 8 am. Patient is unable to be seen at all other offered times due to work. Order placed for precert.  Routing to provider for final review. Patient agreeable to disposition. Will close encounter.

## 2016-11-26 NOTE — Telephone Encounter (Signed)
Patient states she has had none stop bleeding with her IUD. She wants to come in to have this removed.

## 2016-11-26 NOTE — Telephone Encounter (Signed)
Left message to call Kaitlyn at [phone]. 

## 2016-11-28 ENCOUNTER — Encounter: Payer: Self-pay | Admitting: Obstetrics and Gynecology

## 2016-11-28 ENCOUNTER — Ambulatory Visit (INDEPENDENT_AMBULATORY_CARE_PROVIDER_SITE_OTHER): Payer: 59 | Admitting: Obstetrics and Gynecology

## 2016-11-28 VITALS — BP 104/68 | HR 64 | Resp 14 | Wt 154.0 lb

## 2016-11-28 DIAGNOSIS — N921 Excessive and frequent menstruation with irregular cycle: Secondary | ICD-10-CM

## 2016-11-28 DIAGNOSIS — Z30432 Encounter for removal of intrauterine contraceptive device: Secondary | ICD-10-CM | POA: Diagnosis not present

## 2016-11-28 DIAGNOSIS — Z975 Presence of (intrauterine) contraceptive device: Secondary | ICD-10-CM | POA: Diagnosis not present

## 2016-11-28 NOTE — Progress Notes (Signed)
GYNECOLOGY  VISIT   HPI: 36 y.o.   Single  Hispanic  female   G0P0000 with Patient's last menstrual period was 10/04/2016.   here for IUD removal. She had a paragard IUD inserted in 11/17. Cycles were heavy and more crampy after the IUD insertion. The bleed most of February. Then has been bleeding or spotting since March 15. Not light headed or dizzy. She seems to have a clear cycle, then has light bleeding or spotting in between her cycles. She wants to take a break from contraception, not currently sexually active (broke up with her boyfriend). No concerns about STD's.      GYNECOLOGIC HISTORY: Patient's last menstrual period was 10/04/2016. Contraception:IUD Menopausal hormone therapy: none         OB History    Gravida Para Term Preterm AB Living   0 0 0 0 0 0   SAB TAB Ectopic Multiple Live Births   0 0 0 0           There are no active problems to display for this patient.   Past Medical History:  Diagnosis Date  . Abnormal Pap smear of cervix 2001  . Anxiety   . PCOS (polycystic ovarian syndrome)     Past Surgical History:  Procedure Laterality Date  . CERVICAL BIOPSY  W/ LOOP ELECTRODE EXCISION  Age 39    Paps have been normal eversince   . COLPOSCOPY      Current Outpatient Prescriptions  Medication Sig Dispense Refill  . ALPRAZolam (XANAX) 0.5 MG tablet as needed.    Marland Kitchen BIOTIN PO Take by mouth.    . Multiple Vitamins-Minerals (MULTIVITAMIN PO) Take by mouth daily.    Marland Kitchen PARAGARD INTRAUTERINE COPPER IU by Intrauterine route.    . Probiotic Product (PROBIOTIC PO) Take by mouth daily.     No current facility-administered medications for this visit.      ALLERGIES: Penicillins  Family History  Problem Relation Age of Onset  . Diabetes Mother   . Heart attack Paternal Grandfather   . Hypertension Father     Social History   Social History  . Marital status: Single    Spouse name: N/A  . Number of children: N/A  . Years of education: N/A    Occupational History  . Not on file.   Social History Main Topics  . Smoking status: Never Smoker  . Smokeless tobacco: Never Used  . Alcohol use 3.5 - 5.0 oz/week    7 - 10 Standard drinks or equivalent per week  . Drug use: No  . Sexual activity: Yes    Partners: Male    Birth control/ protection: Condom   Other Topics Concern  . Not on file   Social History Narrative  . No narrative on file    Review of Systems  Constitutional: Negative.   HENT: Negative.   Eyes: Negative.   Respiratory: Negative.   Cardiovascular: Negative.   Gastrointestinal: Negative.   Genitourinary:       Irregular menstrual cycles  Musculoskeletal: Negative.   Skin: Negative.   Neurological: Negative.   Endo/Heme/Allergies: Negative.   Psychiatric/Behavioral: Negative.     PHYSICAL EXAMINATION:    BP 104/68 (BP Location: Right Arm, Patient Position: Sitting, Cuff Size: Normal)   Pulse 64   Resp 14   Wt 154 lb (69.9 kg)   LMP 10/04/2016 Comment: ongoing cycle since 10/04/16   BMI 24.67 kg/m     General appearance: alert, cooperative and appears stated  age   Pelvic: External genitalia:  no lesions              Urethra:  normal appearing urethra with no masses, tenderness or lesions              Bartholins and Skenes: normal                 Vagina: normal appearing vagina with normal color and discharge, no lesions              Cervix: no lesions, not friable, IUD string 3-4 cm, IUD removed with ringed forceps.  Chaperone was present for exam.  ASSESSMENT Abnormal bleeding with the paragard IUD Not currently sexually active    PLAN IUD pulled, she will calendar her cycles, if her abnormal bleeding persists she will return for f/u Would consider a kyleena or mirena IUD in the future   An After Visit Summary was printed and given to the patient.

## 2017-05-30 ENCOUNTER — Other Ambulatory Visit: Payer: Self-pay

## 2017-05-30 ENCOUNTER — Ambulatory Visit (INDEPENDENT_AMBULATORY_CARE_PROVIDER_SITE_OTHER): Payer: 59 | Admitting: Certified Nurse Midwife

## 2017-05-30 ENCOUNTER — Encounter: Payer: Self-pay | Admitting: Certified Nurse Midwife

## 2017-05-30 VITALS — BP 110/60 | HR 64 | Resp 16 | Ht 65.75 in | Wt 146.0 lb

## 2017-05-30 DIAGNOSIS — B009 Herpesviral infection, unspecified: Secondary | ICD-10-CM | POA: Diagnosis not present

## 2017-05-30 DIAGNOSIS — Z01419 Encounter for gynecological examination (general) (routine) without abnormal findings: Secondary | ICD-10-CM

## 2017-05-30 DIAGNOSIS — Z113 Encounter for screening for infections with a predominantly sexual mode of transmission: Secondary | ICD-10-CM | POA: Diagnosis not present

## 2017-05-30 MED ORDER — VALACYCLOVIR HCL 1 G PO TABS: ORAL_TABLET | ORAL | 1 refills | Status: DC

## 2017-05-30 NOTE — Patient Instructions (Signed)
EXERCISE AND DIET:  We recommended that you start or continue a regular exercise program for good health. Regular exercise means any activity that makes your heart beat faster and makes you sweat.  We recommend exercising at least 30 minutes per day at least 3 days a week, preferably 4 or 5.  We also recommend a diet low in fat and sugar.  Inactivity, poor dietary choices and obesity can cause diabetes, heart attack, stroke, and kidney damage, among others.    ALCOHOL AND SMOKING:  Women should limit their alcohol intake to no more than 7 drinks/beers/glasses of wine (combined, not each!) per week. Moderation of alcohol intake to this level decreases your risk of breast cancer and liver damage. And of course, no recreational drugs are part of a healthy lifestyle.  And absolutely no smoking or even second hand smoke. Most people know smoking can cause heart and lung diseases, but did you know it also contributes to weakening of your bones? Aging of your skin?  Yellowing of your teeth and nails?  CALCIUM AND VITAMIN D:  Adequate intake of calcium and Vitamin D are recommended.  The recommendations for exact amounts of these supplements seem to change often, but generally speaking 600 mg of calcium (either carbonate or citrate) and 800 units of Vitamin D per day seems prudent. Certain women may benefit from higher intake of Vitamin D.  If you are among these women, your doctor will have told you during your visit.    PAP SMEARS:  Pap smears, to check for cervical cancer or precancers,  have traditionally been done yearly, although recent scientific advances have shown that most women can have pap smears less often.  However, every woman still should have a physical exam from her gynecologist every year. It will include a breast check, inspection of the vulva and vagina to check for abnormal growths or skin changes, a visual exam of the cervix, and then an exam to evaluate the size and shape of the uterus and  ovaries.  And after 37 years of age, a rectal exam is indicated to check for rectal cancers. We will also provide age appropriate advice regarding health maintenance, like when you should have certain vaccines, screening for sexually transmitted diseases, bone density testing, colonoscopy, mammograms, etc.   MAMMOGRAMS:  All women over 40 years old should have a yearly mammogram. Many facilities now offer a "3D" mammogram, which may cost around $50 extra out of pocket. If possible,  we recommend you accept the option to have the 3D mammogram performed.  It both reduces the number of women who will be called back for extra views which then turn out to be normal, and it is better than the routine mammogram at detecting truly abnormal areas.    COLONOSCOPY:  Colonoscopy to screen for colon cancer is recommended for all women at age 50.  We know, you hate the idea of the prep.  We agree, BUT, having colon cancer and not knowing it is worse!!  Colon cancer so often starts as a polyp that can be seen and removed at colonscopy, which can quite literally save your life!  And if your first colonoscopy is normal and you have no family history of colon cancer, most women don't have to have it again for 10 years.  Once every ten years, you can do something that may end up saving your life, right?  We will be happy to help you get it scheduled when you are ready.    Be sure to check your insurance coverage so you understand how much it will cost.  It may be covered as a preventative service at no cost, but you should check your particular policy.      Oral Ulcers Oral ulcers are sores inside the mouth or near the mouth. They may be called canker sores or cold sores, which are two types of oral ulcers. Many oral ulcers are harmless and go away on their own. In some cases, oral ulcers may require medical care to determine the cause and proper treatment. What are the causes? Common causes of this condition  include:  Viral, bacterial, or fungal infection.  Emotional stress.  Foods or chemicals that irritate the mouth.  Injury or physical irritation of the mouth.  Medicines.  Allergies.  Tobacco use.  Less common causes include:  Skin disease.  A type of herpes virus infection (herpes simplexor herpes zoster).  Oral cancer.  In some cases, the cause of this condition may not be known. What increases the risk? Oral ulcers are more likely to develop in:  People who wear dental braces, dentures, or retainers.  People who do not keep their mouth clean or brush their teeth regularly.  People who have sensitive skin.  People who have conditions that affect the entire body (systemic conditions), such as immune disorders.  What are the signs or symptoms? The main symptom of this condition is one or more oval-shaped or round ulcers that have red borders. Details about symptoms may vary depending on the cause.  Location of the ulcers. They may be inside the mouth, on the gums, or on the insides of the lips or cheeks. They may also be on the lips or on skin that is near the mouth, such as the cheeks and chin.  Pain. Ulcers can be painful and uncomfortable, or they can be painless.  Appearance of the ulcers. They may look like red blisters and be filled with fluid, or they may be white or yellow patches.  Frequency of outbreaks. Ulcers may go away permanently after one outbreak, or they may come back (recur) often or rarely.  How is this diagnosed? This condition is diagnosed with a physical exam. Your health care provider may ask you questions about your lifestyle and your medical history. You may have tests, including:  Blood tests.  Removal of a small number of cells from an ulcer to be examined under a microscope (biopsy).  How is this treated? This condition is treated by managing any pain and discomfort, and by treating the underlying cause of the ulcers, if necessary.  Usually, oral ulcers resolve by themselves in 1-2 weeks. You may be told to keep your mouth clean and avoid things that cause or irritate your ulcers. Your health care provider may prescribe medicines to reduce pain and discomfort or treat the underlying cause, if this applies. Follow these instructions at home: Lifestyle  Follow instructions from your health care provider about eating or drinking restrictions. ? Drink enough fluid to keep your urine clear or pale yellow. ? Avoid foods and drinks that irritate your ulcers.  Avoid tobacco products, including cigarettes, chewing tobacco, or e-cigarettes. If you need help quitting, ask your health care provider.  Avoid excessive alcohol use. Oral Hygiene  Avoid physical or chemical irritants that may have caused the ulcers or made them worse, such as mouthwashes that contain alcohol (ethanol). If you wear dental braces, dentures, or retainers, work with your health care provider to make  sure these devices are fitted correctly.  Brush and floss your teeth at least once every day, and get regular dental cleanings and checkups.  Gargle with a salt-water mixture 3-4 times per day or as told by your health care provider. To make a salt-water mixture, completely dissolve -1 tsp of salt in 1 cup of warm water. General instructions  Take over-the-counter and prescription medicines only as told by your health care provider.  If you have pain, wrap a cold compress in a towel and gently press it against your face to help reduce pain.  Keep all follow-up visits as told by your health care provider. This is important. Contact a health care provider if:  You have pain that gets worse or does not get better with medicine.  You have 4 or more ulcers at one time.  You have a fever.  You have new ulcers that look or feel different from other ulcers you have.  You have inflammation in one eye or both eyes.  You have ulcers that do not go away after  10 days.  You develop new symptoms in your mouth, such as: ? Bleeding or crusting around your lips or gums. ? Tooth pain. ? Difficulty swallowing.  You develop symptoms on your skin or genitals, such as: ? A rash or blisters. ? Burning or itching sensations.  Your ulcers begin or get worse after you start a new medicine. Get help right away if:  You have difficulty breathing.  You have swelling in your face or neck.  You have excessive bleeding from your mouth.  You have severe pain. This information is not intended to replace advice given to you by your health care provider. Make sure you discuss any questions you have with your health care provider. Document Released: 08/16/2004 Document Revised: 12/12/2015 Document Reviewed: 11/24/2014 Elsevier Interactive Patient Education  [employer].

## 2017-05-30 NOTE — Progress Notes (Signed)
36 y.o. G0P0000 Single  Hispanic Fe here for annual exam. Contraception condoms consistently. Would be interested in Mirena IUD. Had Paragard IUD and removed 5/18 this year here due to heavier periods and spotting in between. Did not want hormonal use if possible, but prefers convenience. Marland Kitchen Has not been in relationship since removal but has new partner now and needs contraception. Using condoms consistently. No STD concerns, but vaginally screening requested. Sees PCP for aex and labs, Dr.Ramachandran, all normal per patient. Has stye on left eyelid resolving. History of oral mouth sores, uses OTC without much success any options. No other health issues today.  Patient's last menstrual period was 04/27/2017.          Sexually active: Yes.    The current method of family planning is condoms most of the time.    Exercising: Yes.    yoga and cardio Smoker:  no  Health Maintenance: Pap:  05-04-14 neg HPV HR neg, 06-06-16 neg HPV HR neg History of Abnormal Pap: yes MMG:  none Self Breast exams: no Colonoscopy:  none BMD:   none TDaP:  2013 Shingles: no Pneumonia: no Hep C and HIV: HIV neg 2017 Labs: PCP takes care of labs   reports that  has never smoked. she has never used smokeless tobacco. She reports that she drinks about 3.5 - 5.0 oz of alcohol per week. She reports that she does not use drugs.  Past Medical History:  Diagnosis Date  . Abnormal Pap smear of cervix 2001  . Anxiety   . PCOS (polycystic ovarian syndrome)     Past Surgical History:  Procedure Laterality Date  . CERVICAL BIOPSY  W/ LOOP ELECTRODE EXCISION  Age 10    Paps have been normal eversince   . COLPOSCOPY      Current Outpatient Medications  Medication Sig Dispense Refill  . ALPRAZolam (XANAX) 0.5 MG tablet as needed.    Marland Kitchen BIOTIN PO Take by mouth.    . Multiple Vitamins-Minerals (MULTIVITAMIN PO) Take by mouth daily.    . Probiotic Product (PROBIOTIC PO) Take by mouth daily.     No current  facility-administered medications for this visit.     Family History  Problem Relation Age of Onset  . Diabetes Mother   . Heart attack Paternal Grandfather   . Hypertension Father     ROS:  Pertinent items are noted in HPI.  Otherwise, a comprehensive ROS was negative.  Exam:   BP 110/60 (BP Location: Right Arm, Patient Position: Sitting, Cuff Size: Normal)   Pulse 64   Resp 16   Ht 5' 5.75" (1.67 m)   Wt 146 lb (66.2 kg)   LMP 04/27/2017   BMI 23.74 kg/m  Height: 5' 5.75" (167 cm) Ht Readings from Last 3 Encounters:  05/30/17 5' 5.75" (1.67 m)  06/06/16 5' 6.25" (1.683 m)  05/19/15 5\' 6"  (1.676 m)    General appearance: alert, cooperative and appears stated age Head: Normocephalic, without obvious abnormality, atraumatic Neck: no adenopathy, supple, symmetrical, trachea midline and thyroid normal to inspection and palpation Lungs: clear to auscultation bilaterally Breasts: normal appearance, no masses or tenderness, No nipple retraction or dimpling, No nipple discharge or bleeding, No axillary or supraclavicular adenopathy Heart: regular rate and rhythm Abdomen: soft, non-tender; no masses,  no organomegaly Extremities: extremities normal, atraumatic, no cyanosis or edema Skin: Skin color, texture, turgor normal. No rashes or lesions Lymph nodes: Cervical, supraclavicular, and axillary nodes normal. No abnormal inguinal nodes palpated Neurologic: Grossly  normal   Pelvic: External genitalia:  no lesions              Urethra:  normal appearing urethra with no masses, tenderness or lesions              Bartholin's and Skene's: normal                 Vagina: normal appearing vagina with normal color and discharge, no lesions              Cervix: no cervical motion tenderness, no lesions and nulliparous appearance              Pap taken: No. Bimanual Exam:  Uterus:  normal size, contour, position, consistency, mobility, non-tender              Adnexa: normal adnexa and no  mass, fullness, tenderness               Rectovaginal: Confirms               Anus:  normal sphincter tone, no lesions  Chaperone present: yes  A:  Well Woman with normal exam  Contraception condoms, contraception change desired, Mirena  IUD  STD screening  Recurring oral mouth sores with stress, uses OTC, requests help with      P:   Reviewed health and wellness pertinent to exam  Discussed risks/benefits of Mirena IUD and hormonal expectations.Discussed importance of adjustment period after insertion and expectations. Given patient insurance information to check on her benefits. Aware IUD need to be inserted day 1-5 of period and will need to call if she decides to move forward with Mirena. Will MD insertion due to nulliparous.  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 Expectations of treatment given.  Rx Valtrex see order with instructions. Printed Rx given to patient.  Pap smear: no   counseled on breast self exam, STD prevention, HIV risk factors and prevention, adequate intake of calcium and vitamin D, diet and exercise  return annually or prn  An After Visit Summary was printed and given to the patient.

## 2017-05-31 LAB — VAGINITIS/VAGINOSIS, DNA PROBE
CANDIDA SPECIES: NEGATIVE
GARDNERELLA VAGINALIS: NEGATIVE
TRICHOMONAS VAG: NEGATIVE

## 2017-06-01 LAB — GC/CHLAMYDIA PROBE AMP
Chlamydia trachomatis, NAA: NEGATIVE
NEISSERIA GONORRHOEAE BY PCR: NEGATIVE

## 2017-06-07 ENCOUNTER — Ambulatory Visit: Payer: 59 | Admitting: Certified Nurse Midwife

## 2017-07-23 HISTORY — PX: INTRAUTERINE DEVICE (IUD) INSERTION: SHX5877

## 2017-07-31 ENCOUNTER — Ambulatory Visit: Payer: 59 | Admitting: Certified Nurse Midwife

## 2017-07-31 ENCOUNTER — Telehealth: Payer: Self-pay | Admitting: Certified Nurse Midwife

## 2017-07-31 NOTE — Telephone Encounter (Signed)
Left detailed message at number provided [phone]. Advised per review with Melvia Heaps CNM she will need this performed with MD. It has been since May that her IUD was pulled and would feel more comfortable with MD insertion. Advised will need to return call to reschedule.

## 2017-07-31 NOTE — Telephone Encounter (Signed)
Spoke with patient. Appointment moved to 08/01/2017 at 2:45 pm with Dr.Jertson. Patient is agreeable to date and time.  Routing to provider for final review. Patient agreeable to disposition. Will close encounter.

## 2017-07-31 NOTE — Telephone Encounter (Signed)
Left message to call Zalea Pete at [phone]. 

## 2017-07-31 NOTE — Telephone Encounter (Signed)
Spoke with patient. Patient started her menses today and would like to schedule Mirena IUD exchange. Patient has limited scheduling ability. Requesting to be seen this afternoon or tomorrow afternoon. Appointment scheduled for today 07/31/2017 at 2:45 pm with Melvia Heaps CNM. Pre procedure instructions given.  Motrin instructions given. Motrin=Advil=Ibuprofen, 800 mg one hour before appointment. Eat a meal and hydrate well before appointment. Patient is agreeable to date and time.  Patient states that per Melvia Heaps CNM this could be performed with her as she has had a prior IUD even though she has not had children. Aware this will need to be reviewed with Melvia Heaps CNM as this is not usual protocol.

## 2017-07-31 NOTE — Telephone Encounter (Signed)
Patient's cycle started today and she's calling to schedule a mirena insertion.

## 2017-08-01 ENCOUNTER — Other Ambulatory Visit: Payer: Self-pay

## 2017-08-01 ENCOUNTER — Ambulatory Visit (INDEPENDENT_AMBULATORY_CARE_PROVIDER_SITE_OTHER): Payer: 59 | Admitting: Obstetrics and Gynecology

## 2017-08-01 ENCOUNTER — Encounter: Payer: Self-pay | Admitting: Obstetrics and Gynecology

## 2017-08-01 VITALS — BP 110/60 | HR 80 | Resp 18 | Wt 146.0 lb

## 2017-08-01 DIAGNOSIS — Z30430 Encounter for insertion of intrauterine contraceptive device: Secondary | ICD-10-CM

## 2017-08-01 DIAGNOSIS — Z01812 Encounter for preprocedural laboratory examination: Secondary | ICD-10-CM

## 2017-08-01 LAB — POCT URINE PREGNANCY: Preg Test, Ur: NEGATIVE

## 2017-08-01 NOTE — Progress Notes (Signed)
GYNECOLOGY  VISIT   HPI: 37 y.o.   Single  Caucasian  female   G0P0000 with Patient's last menstrual period was 07/31/2017.   here for Mirena IUD insertion   GYNECOLOGIC HISTORY: Patient's last menstrual period was 07/31/2017. Contraception:none  Menopausal hormone therapy: none         OB History    Gravida Para Term Preterm AB Living   0 0 0 0 0 0   SAB TAB Ectopic Multiple Live Births   0 0 0 0           There are no active problems to display for this patient.   Past Medical History:  Diagnosis Date  . Abnormal Pap smear of cervix 2001  . Anxiety   . PCOS (polycystic ovarian syndrome)     Past Surgical History:  Procedure Laterality Date  . CERVICAL BIOPSY  W/ LOOP ELECTRODE EXCISION  Age 89    Paps have been normal eversince   . COLPOSCOPY      Current Outpatient Medications  Medication Sig Dispense Refill  . ALPRAZolam (XANAX) 0.5 MG tablet as needed.    Marland Kitchen BIOTIN PO Take by mouth.    . Multiple Vitamins-Minerals (MULTIVITAMIN PO) Take by mouth daily.    . Probiotic Product (PROBIOTIC PO) Take by mouth daily.    . valACYclovir (VALTREX) 1000 MG tablet Take one bid x one day at onset of outbreaks 30 tablet 1   No current facility-administered medications for this visit.      ALLERGIES: Penicillins and Sulfamethoxazole-trimethoprim  Family History  Problem Relation Age of Onset  . Diabetes Mother   . Heart attack Paternal Grandfather   . Hypertension Father     Social History   Socioeconomic History  . Marital status: Single    Spouse name: Not on file  . Number of children: Not on file  . Years of education: Not on file  . Highest education level: Not on file  Social Needs  . Financial resource strain: Not on file  . Food insecurity - worry: Not on file  . Food insecurity - inability: Not on file  . Transportation needs - medical: Not on file  . Transportation needs - non-medical: Not on file  Occupational History  . Not on file  Tobacco  Use  . Smoking status: Never Smoker  . Smokeless tobacco: Never Used  Substance and Sexual Activity  . Alcohol use: Yes    Alcohol/week: 3.5 - 5.0 oz    Types: 7 - 10 Standard drinks or equivalent per week  . Drug use: No  . Sexual activity: Yes    Partners: Male    Birth control/protection: Condom  Other Topics Concern  . Not on file  Social History Narrative  . Not on file    Review of Systems  Constitutional: Negative.   HENT: Negative.   Eyes: Negative.   Respiratory: Negative.   Cardiovascular: Negative.   Gastrointestinal: Negative.   Genitourinary: Negative.   Musculoskeletal: Negative.   Skin: Negative.   Neurological: Negative.   Endo/Heme/Allergies: Negative.   Psychiatric/Behavioral: Negative.     PHYSICAL EXAMINATION:    BP 110/60 (BP Location: Right Arm, Patient Position: Sitting, Cuff Size: Normal)   Pulse 80   Resp 18   Wt 146 lb (66.2 kg)   LMP 07/31/2017   BMI 23.74 kg/m     General appearance: alert, cooperative and appears stated age Pelvic: External genitalia:  no lesions  Urethra:  normal appearing urethra with no masses, tenderness or lesions              Bartholins and Skenes: normal                 Vagina: normal appearing vagina with normal color and discharge, no lesions              Cervix:no lesions  The risks of the mirena IUD were reviewed with the patient, including infection, abnormal bleeding and uterine perfortion. Consent was signed.  A speculum was placed in the vagina, the cervix was cleansed with betadine. A tenaculum was placed on the cervix, the uterus sounded to 7-8 cm. The cervix was dilated to a 5 hagar dilator  The mirena IUD was inserted without difficulty. The string were cut to 3-4 cm. The tenaculum was removed. Slight oozing from the tenaculum site was stopped with pressure.   The patient tolerated the procedure well.   Chaperone was present for exam.  ASSESSMENT Mirena IUD insertion     PLAN F/U in 4 weeks Call with any concerns   An After Visit Summary was printed and given to the patient.

## 2017-08-01 NOTE — Patient Instructions (Signed)
IUD Post-procedure Instructions Cramping is common.  You may take Ibuprofen, Aleve, or Tylenol for the cramping.  This should resolve within 24 hours.   You may have a small amount of spotting.  You should wear a mini pad for the next few days. You may have intercourse in 24 hours. You need to call the office if you have any pelvic pain, fever, heavy bleeding, or foul smelling vaginal discharge. Shower or bathe as normal Use back up contraception for one week 

## 2017-09-11 ENCOUNTER — Encounter: Payer: Self-pay | Admitting: Obstetrics and Gynecology

## 2017-09-11 ENCOUNTER — Other Ambulatory Visit: Payer: Self-pay

## 2017-09-11 ENCOUNTER — Ambulatory Visit (INDEPENDENT_AMBULATORY_CARE_PROVIDER_SITE_OTHER): Payer: 59 | Admitting: Obstetrics and Gynecology

## 2017-09-11 VITALS — BP 102/60 | HR 72 | Resp 16 | Wt 152.0 lb

## 2017-09-11 DIAGNOSIS — Z30431 Encounter for routine checking of intrauterine contraceptive device: Secondary | ICD-10-CM

## 2017-09-11 NOTE — Progress Notes (Signed)
GYNECOLOGY  VISIT   HPI: 37 y.o.   Single  Caucasian  female   G0P0000 with Patient's last menstrual period was 08/13/2017.   here for IUD follow up. The mirena IUD was placed in 1/19. She has been spotting.  No significant cramping, no dyspareunia.    GYNECOLOGIC HISTORY: Patient's last menstrual period was 08/13/2017. Contraception:IUD (Mirena) Menopausal hormone therapy: none         OB History    Gravida Para Term Preterm AB Living   0 0 0 0 0 0   SAB TAB Ectopic Multiple Live Births   0 0 0 0           There are no active problems to display for this patient.   Past Medical History:  Diagnosis Date  . Abnormal Pap smear of cervix 2001  . Anxiety   . PCOS (polycystic ovarian syndrome)     Past Surgical History:  Procedure Laterality Date  . CERVICAL BIOPSY  W/ LOOP ELECTRODE EXCISION  Age 50    Paps have been normal eversince   . COLPOSCOPY    . INTRAUTERINE DEVICE (IUD) INSERTION  07/2017   Mirena     Current Outpatient Medications  Medication Sig Dispense Refill  . ALPRAZolam (XANAX) 0.5 MG tablet as needed.    Marland Kitchen BIOTIN PO Take by mouth.    . levonorgestrel (MIRENA) 20 MCG/24HR IUD 1 each by Intrauterine route once.    . Multiple Vitamins-Minerals (MULTIVITAMIN PO) Take by mouth daily.    . Probiotic Product (PROBIOTIC PO) Take by mouth daily.    . valACYclovir (VALTREX) 1000 MG tablet Take one bid x one day at onset of outbreaks 30 tablet 1   No current facility-administered medications for this visit.      ALLERGIES: Penicillins and Sulfamethoxazole-trimethoprim  Family History  Problem Relation Age of Onset  . Diabetes Mother   . Heart attack Paternal Grandfather   . Hypertension Father     Social History   Socioeconomic History  . Marital status: Single    Spouse name: Not on file  . Number of children: Not on file  . Years of education: Not on file  . Highest education level: Not on file  Social Needs  . Financial resource strain: Not on  file  . Food insecurity - worry: Not on file  . Food insecurity - inability: Not on file  . Transportation needs - medical: Not on file  . Transportation needs - non-medical: Not on file  Occupational History  . Not on file  Tobacco Use  . Smoking status: Never Smoker  . Smokeless tobacco: Never Used  Substance and Sexual Activity  . Alcohol use: Yes    Alcohol/week: 3.5 - 5.0 oz    Types: 7 - 10 Standard drinks or equivalent per week  . Drug use: No  . Sexual activity: Yes    Partners: Male    Birth control/protection: Condom  Other Topics Concern  . Not on file  Social History Narrative  . Not on file    Review of Systems  Constitutional: Negative.   HENT: Negative.   Eyes: Negative.   Respiratory: Negative.   Cardiovascular: Negative.   Gastrointestinal: Negative.   Genitourinary: Negative.   Musculoskeletal: Negative.   Skin: Negative.   Neurological: Negative.   Endo/Heme/Allergies: Negative.   Psychiatric/Behavioral: Negative.     PHYSICAL EXAMINATION:    BP 102/60 (BP Location: Right Arm, Patient Position: Sitting, Cuff Size: Normal)   Pulse  72   Resp 16   Wt 152 lb (68.9 kg)   LMP 08/13/2017   BMI 24.72 kg/m     General appearance: alert, cooperative and appears stated age  Pelvic: External genitalia:  no lesions              Urethra:  normal appearing urethra with no masses, tenderness or lesions              Bartholins and Skenes: normal                 Vagina: normal appearing vagina with normal color and discharge, no lesions              Cervix: no lesions and IUD string 3 cm              Bimanual Exam:  Uterus:  normal size, contour, position, consistency, mobility, non-tender              Adnexa: no mass, fullness, tenderness                Chaperone was present for exam.  ASSESSMENT Mirena IUD check, doing well    PLAN F/u with Ms Hollice Espy in 11/19 for an annual exam   An After Visit Summary was printed and given to the  patient.  CC: Evalee Mutton, CNM

## 2018-06-10 ENCOUNTER — Encounter: Payer: Self-pay | Admitting: Certified Nurse Midwife

## 2018-06-10 ENCOUNTER — Other Ambulatory Visit: Payer: Self-pay

## 2018-06-10 ENCOUNTER — Ambulatory Visit (INDEPENDENT_AMBULATORY_CARE_PROVIDER_SITE_OTHER): Payer: 59 | Admitting: Certified Nurse Midwife

## 2018-06-10 ENCOUNTER — Other Ambulatory Visit (HOSPITAL_COMMUNITY)
Admission: RE | Admit: 2018-06-10 | Discharge: 2018-06-10 | Disposition: A | Discharge status: Home / Self Care | Payer: 59 | Source: Ambulatory Visit | Attending: Certified Nurse Midwife | Admitting: Certified Nurse Midwife

## 2018-06-10 VITALS — BP 104/64 | HR 64 | Resp 16 | Ht 66.25 in | Wt 150.0 lb

## 2018-06-10 DIAGNOSIS — Z124 Encounter for screening for malignant neoplasm of cervix: Secondary | ICD-10-CM | POA: Insufficient documentation

## 2018-06-10 DIAGNOSIS — Z01419 Encounter for gynecological examination (general) (routine) without abnormal findings: Secondary | ICD-10-CM

## 2018-06-10 DIAGNOSIS — Z30431 Encounter for routine checking of intrauterine contraceptive device: Secondary | ICD-10-CM | POA: Diagnosis not present

## 2018-06-10 NOTE — Progress Notes (Signed)
37 y.o. G0P0000 Single  Hispanic Fe here for annual exam. Periods sporadic and occasionally every month. 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 No STD concerns. Sees Dr Mickle Asper yearly for aex and labs, all stable per patient. Changing jobs early next year, plans sabbatical to adjust. No warning signs with IUD use. No other health issues today. Recently engaged!  LMP 05/20/18 normal        Sexually active: Yes.    The current method of family planning is IUD.    Exercising: Yes.    cardio & yoga Smoker:  no  Review of Systems  Constitutional: Negative.   HENT: Negative.   Eyes: Negative.   Respiratory: Negative.   Cardiovascular: Negative.   Gastrointestinal: Negative.   Genitourinary: Negative.   Musculoskeletal: Negative.   Skin: Negative.   Neurological: Negative.   Endo/Heme/Allergies: Negative.   Psychiatric/Behavioral: Negative.     Health Maintenance: Pap: 05-04-14 neg HPV HR neg, 06-06-16 neg HPV HR neg History of Abnormal Pap: LEEP MMG:  none Self Breast exams: no Colonoscopy:  none BMD:   none TDaP:  2013 Shingles: no Pneumonia: no Hep C and HIV: HIV neg per patient Labs: no   reports that she has never smoked. She has never used smokeless tobacco. She reports that she drinks about 7.0 - 10.0 standard drinks of alcohol per week. She reports that she does not use drugs.  Past Medical History:  Diagnosis Date  . Abnormal Pap smear of cervix 2001  . Anxiety   . PCOS (polycystic ovarian syndrome)     Past Surgical History:  Procedure Laterality Date  . CERVICAL BIOPSY  W/ LOOP ELECTRODE EXCISION  Age 75    Paps have been normal eversince   . COLPOSCOPY    . INTRAUTERINE DEVICE (IUD) INSERTION  07/2017   Mirena     Current Outpatient Medications  Medication Sig Dispense Refill  . ALPRAZolam (XANAX) 0.5 MG tablet as needed.    Marland Kitchen BIOTIN PO Take by mouth.    . levonorgestrel (MIRENA) 20 MCG/24HR IUD 1 each by Intrauterine route once.    .  Multiple Vitamins-Minerals (MULTIVITAMIN PO) Take by mouth daily.    . Probiotic Product (PROBIOTIC PO) Take by mouth daily.    . valACYclovir (VALTREX) 1000 MG tablet Take one bid x one day at onset of outbreaks 30 tablet 1   No current facility-administered medications for this visit.     Family History  Problem Relation Age of Onset  . Diabetes Mother   . Heart attack Paternal Grandfather   . Hypertension Father     ROS:  Pertinent items are noted in HPI.  Otherwise, a comprehensive ROS was negative.  Exam:   There were no vitals taken for this visit.   Ht Readings from Last 3 Encounters:  05/30/17 5' 5.75" (1.67 m)  06/06/16 5' 6.25" (1.683 m)  05/19/15 5\' 6"  (1.676 m)    General appearance: alert, cooperative and appears stated age Head: Normocephalic, without obvious abnormality, atraumatic Neck: no adenopathy, supple, symmetrical, trachea midline and thyroid normal to inspection and palpation Lungs: clear to auscultation bilaterally Breasts: normal appearance, no masses or tenderness, No nipple retraction or dimpling, No nipple discharge or bleeding, No axillary or supraclavicular adenopathy Heart: regular rate and rhythm Abdomen: soft, non-tender; no masses,  no organomegaly Extremities: extremities normal, atraumatic, no cyanosis or edema Skin: Skin color, texture, turgor normal. No rashes or lesions Lymph nodes: Cervical, supraclavicular, and axillary nodes normal. No  abnormal inguinal nodes palpated Neurologic: Grossly normal   Pelvic: External genitalia:  no lesions              Urethra:  normal appearing urethra with no masses, tenderness or lesions              Bartholin's and Skene's: normal                 Vagina: normal appearing vagina with normal color and discharge, no lesions              Cervix: no cervical motion tenderness, no lesions and IUD string present in cervix              Pap taken: Yes.   Bimanual Exam:  Uterus:  normal size, contour,  position, consistency, mobility, non-tender and anteverted              Adnexa: normal adnexa and no mass, fullness, tenderness               Rectovaginal: Confirms               Anus:  normal sphincter tone, no lesions  Chaperone present: yes  A:  Well Woman with normal exam  Contraception Mirena IUD due for removal 08/01/22  History of PCOS  History of HSV oral, Rx refill declined  P:   Reviewed health and wellness pertinent to exam  Warning signs with IUD reviewed and aware of bleeding expectations.  Continue follow with PCP as indicated  Pap smear: yes   counseled on breast self exam, STD prevention, HIV risk factors and prevention, feminine hygiene, adequate intake of calcium and vitamin D, diet and exercise  return annually or prn  An After Visit Summary was printed and given to the patient.

## 2018-06-10 NOTE — Patient Instructions (Signed)

## 2018-06-11 LAB — CYTOLOGY - PAP
DIAGNOSIS: NEGATIVE
HPV (WINDOPATH): NOT DETECTED

## 2018-08-25 DIAGNOSIS — M9901 Segmental and somatic dysfunction of cervical region: Secondary | ICD-10-CM | POA: Diagnosis not present

## 2018-08-25 DIAGNOSIS — M9903 Segmental and somatic dysfunction of lumbar region: Secondary | ICD-10-CM | POA: Diagnosis not present

## 2018-08-25 DIAGNOSIS — M9902 Segmental and somatic dysfunction of thoracic region: Secondary | ICD-10-CM | POA: Diagnosis not present

## 2018-08-29 DIAGNOSIS — Z0000 Encounter for general adult medical examination without abnormal findings: Secondary | ICD-10-CM | POA: Diagnosis not present

## 2018-08-29 DIAGNOSIS — Z79899 Other long term (current) drug therapy: Secondary | ICD-10-CM | POA: Diagnosis not present

## 2018-09-10 DIAGNOSIS — M9902 Segmental and somatic dysfunction of thoracic region: Secondary | ICD-10-CM | POA: Diagnosis not present

## 2018-09-10 DIAGNOSIS — M9901 Segmental and somatic dysfunction of cervical region: Secondary | ICD-10-CM | POA: Diagnosis not present

## 2018-09-10 DIAGNOSIS — M9903 Segmental and somatic dysfunction of lumbar region: Secondary | ICD-10-CM | POA: Diagnosis not present

## 2018-09-12 DIAGNOSIS — Z0000 Encounter for general adult medical examination without abnormal findings: Secondary | ICD-10-CM | POA: Diagnosis not present

## 2018-09-12 DIAGNOSIS — Z5181 Encounter for therapeutic drug level monitoring: Secondary | ICD-10-CM | POA: Diagnosis not present

## 2018-10-21 ENCOUNTER — Encounter: Payer: Self-pay | Admitting: Obstetrics and Gynecology

## 2018-10-21 ENCOUNTER — Other Ambulatory Visit: Payer: Self-pay

## 2018-10-21 ENCOUNTER — Telehealth: Payer: Self-pay | Admitting: Certified Nurse Midwife

## 2018-10-21 ENCOUNTER — Ambulatory Visit (INDEPENDENT_AMBULATORY_CARE_PROVIDER_SITE_OTHER): Payer: 59 | Admitting: Obstetrics and Gynecology

## 2018-10-21 ENCOUNTER — Ambulatory Visit (HOSPITAL_COMMUNITY)
Admission: RE | Admit: 2018-10-21 | Discharge: 2018-10-21 | Disposition: A | Discharge status: Home / Self Care | Payer: 59 | Source: Ambulatory Visit | Attending: Obstetrics and Gynecology | Admitting: Obstetrics and Gynecology

## 2018-10-21 VITALS — BP 120/70 | HR 88 | Temp 99.4°F | Resp 16 | Ht 66.25 in | Wt 155.0 lb

## 2018-10-21 DIAGNOSIS — R102 Pelvic and perineal pain: Secondary | ICD-10-CM

## 2018-10-21 DIAGNOSIS — R109 Unspecified abdominal pain: Secondary | ICD-10-CM

## 2018-10-21 DIAGNOSIS — N912 Amenorrhea, unspecified: Secondary | ICD-10-CM

## 2018-10-21 DIAGNOSIS — Z30431 Encounter for routine checking of intrauterine contraceptive device: Secondary | ICD-10-CM | POA: Diagnosis not present

## 2018-10-21 LAB — POCT URINE PREGNANCY: Preg Test, Ur: NEGATIVE

## 2018-10-21 MED ORDER — IBUPROFEN 800 MG PO TABS: 800.0000 mg | ORAL_TABLET | Freq: Three times a day (TID) | ORAL | 1 refills | Status: DC | PRN

## 2018-10-21 NOTE — Progress Notes (Signed)
GYNECOLOGY  VISIT   HPI: 38 y.o.   Single Unavailable Hispanic or Latino  female   G0P0000 with Patient's last menstrual period was 08/27/2018.   here for LLQ pain x 1 day.  The pain is constant, worse with movement. Pain is now going to the RLQ. Pain is up to a 7/10 in severity. Last night she had a temp of ~100. No temp today. No nausea, emesis, diarrhea. Thought she might have mild constipation, took an enema today, she had a BM, didn't help her pain. She also had a BM yesterday, smaller than normal.  She is engaged.    She has a mirena IUD, placed in 1/19. She isn't having monthly cycles, can have light bleeding monthly or skip a month and spot for several weeks.   GYNECOLOGIC HISTORY: Patient's last menstrual period was 08/27/2018. Contraception: Mirena IUD Menopausal hormone therapy: none        OB History    Gravida  0   Para  0   Term  0   Preterm  0   AB  0   Living  0     SAB  0   TAB  0   Ectopic  0   Multiple  0   Live Births                 There are no active problems to display for this patient.   Past Medical History:  Diagnosis Date  . Abnormal Pap smear of cervix 2001  . Anxiety   . PCOS (polycystic ovarian syndrome)   . STD (sexually transmitted disease)    hsv1    Past Surgical History:  Procedure Laterality Date  . CERVICAL BIOPSY  W/ LOOP ELECTRODE EXCISION  Age 46    Paps have been normal eversince   . COLPOSCOPY    . INTRAUTERINE DEVICE (IUD) INSERTION  07/2017   Mirena     Current Outpatient Medications  Medication Sig Dispense Refill  . ALPRAZolam (XANAX) 0.5 MG tablet as needed.    Marland Kitchen BIOTIN PO Take by mouth.    . levonorgestrel (MIRENA) 20 MCG/24HR IUD 1 each by Intrauterine route once.    . Multiple Vitamins-Minerals (MULTIVITAMIN PO) Take by mouth daily.    . Probiotic Product (PROBIOTIC PO) Take by mouth daily.    . valACYclovir (VALTREX) 1000 MG tablet Take one bid x one day at onset of outbreaks (Patient not  taking: Reported on 10/21/2018) 30 tablet 1   No current facility-administered medications for this visit.      ALLERGIES: Penicillins and Sulfamethoxazole-trimethoprim  Family History  Problem Relation Age of Onset  . Diabetes Mother   . Heart attack Paternal Grandfather   . Hypertension Father     Social History   Socioeconomic History  . Marital status: Single    Spouse name: Not on file  . Number of children: Not on file  . Years of education: Not on file  . Highest education level: Not on file  Occupational History  . Not on file  Social Needs  . Financial resource strain: Not on file  . Food insecurity:    Worry: Not on file    Inability: Not on file  . Transportation needs:    Medical: Not on file    Non-medical: Not on file  Tobacco Use  . Smoking status: Never Smoker  . Smokeless tobacco: Never Used  Substance and Sexual Activity  . Alcohol use: Yes    Alcohol/week:  10.0 - 14.0 standard drinks    Types: 10 - 14 Standard drinks or equivalent per week  . Drug use: No  . Sexual activity: Yes    Partners: Male    Birth control/protection: I.U.D.    Comment: Mirena inserted 07/2017   Lifestyle  . Physical activity:    Days per week: Not on file    Minutes per session: Not on file  . Stress: Not on file  Relationships  . Social connections:    Talks on phone: Not on file    Gets together: Not on file    Attends religious service: Not on file    Active member of club or organization: Not on file    Attends meetings of clubs or organizations: Not on file    Relationship status: Not on file  . Intimate partner violence:    Fear of current or ex partner: Not on file    Emotionally abused: Not on file    Physically abused: Not on file    Forced sexual activity: Not on file  Other Topics Concern  . Not on file  Social History Narrative  . Not on file    Review of Systems  Constitutional: Positive for chills.  HENT: Negative.   Eyes: Negative.    Respiratory: Negative.   Cardiovascular: Negative.   Gastrointestinal: Positive for abdominal pain.  Genitourinary: Negative.   Musculoskeletal: Positive for myalgias.  Skin: Negative.   Neurological: Negative.   Endo/Heme/Allergies: Negative.   Psychiatric/Behavioral: Negative.   All other systems reviewed and are negative.   PHYSICAL EXAMINATION:    BP 120/70   Pulse 88   Temp 99.4 F (37.4 C) (Oral)   Resp 16   Ht 5' 6.25" (1.683 m)   Wt 155 lb (70.3 kg)   LMP 08/27/2018   BMI 24.83 kg/m     General appearance: alert, cooperative and appears stated age Abdomen: soft, tender BLQ, L>R, no rebound, no guarding, non distended, no masses,  no organomegaly  Pelvic: External genitalia:  no lesions              Urethra:  normal appearing urethra with no masses, tenderness or lesions              Bartholins and Skenes: normal                 Vagina: normal appearing vagina with normal color and discharge, no lesions              Cervix: no cervical motion tenderness and no lesions. IUD string 3-4 cm              Bimanual Exam:  Uterus:  normal size, contour, position, consistency, mobility, non-tender and anteverted              Adnexa: no masses, tender on the left              Rectovaginal: Yes.  .  Confirms, slight fullness on the left              Anus:  normal sphincter tone, no lesions  Chaperone was present for exam.  ASSESSMENT Abdominal/pelvc pain, suspect ovarian cyst, some concern for ovarian torsion   PLAN UPT negative  CBC with diff Genprobe Ultrasound today   An After Visit Summary was printed and given to the patient.

## 2018-10-21 NOTE — Telephone Encounter (Signed)
Call from patient. Reports " strong pain" in ovary for 24 hours.  Has used Advil and heating pad to provide some relief. LMP 08-27-2018 and has IUD. Had tep of 100 X 1 yesterday while using heating pad but afebrile since then. States pain will spike to 7/10 but does subside. Requests office visit for tomorrow am. 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 Patient to come now.   Routing to Dr Talbert Nan, [facility]ier.  Encounter closed.

## 2018-10-21 NOTE — Telephone Encounter (Signed)
Return call to patient. Left message to call back. ( Voice mail has name confirmation and DPR confirms can leave message). Left message to call back and ask for triage nurse.

## 2018-10-21 NOTE — Telephone Encounter (Signed)
Patient is having left ovarian pain.

## 2018-10-22 ENCOUNTER — Ambulatory Visit: Payer: 59 | Admitting: Obstetrics and Gynecology

## 2018-10-22 ENCOUNTER — Telehealth: Payer: Self-pay

## 2018-10-22 DIAGNOSIS — N949 Unspecified condition associated with female genital organs and menstrual cycle: Secondary | ICD-10-CM

## 2018-10-22 LAB — CBC WITH DIFFERENTIAL/PLATELET
BASOS ABS: 0 10*3/uL (ref 0.0–0.2)
Basos: 0 %
EOS (ABSOLUTE): 0.1 10*3/uL (ref 0.0–0.4)
Eos: 1 %
HEMATOCRIT: 40.2 % (ref 34.0–46.6)
HEMOGLOBIN: 13.6 g/dL (ref 11.1–15.9)
IMMATURE GRANS (ABS): 0 10*3/uL (ref 0.0–0.1)
IMMATURE GRANULOCYTES: 0 %
LYMPHS: 15 %
Lymphocytes Absolute: 1.8 10*3/uL (ref 0.7–3.1)
MCH: 32.4 pg (ref 26.6–33.0)
MCHC: 33.8 g/dL (ref 31.5–35.7)
MCV: 96 fL (ref 79–97)
MONOCYTES: 8 %
Monocytes Absolute: 1.1 10*3/uL — ABNORMAL HIGH (ref 0.1–0.9)
NEUTROS PCT: 76 %
Neutrophils Absolute: 9.6 10*3/uL — ABNORMAL HIGH (ref 1.4–7.0)
Platelets: 313 10*3/uL (ref 150–450)
RBC: 4.2 x10E6/uL (ref 3.77–5.28)
RDW: 11.7 % (ref 11.7–15.4)
WBC: 12.7 10*3/uL — AB (ref 3.4–10.8)

## 2018-10-22 NOTE — Telephone Encounter (Signed)
Spoke with patient. Results given to patient. Patient states that she has not had a fever but has been taking Advil every 8 hours since yesterday. Reports pain has slightly lessened, but has not gotten drastically better. 5-7/10 depending on if she has taken Advil. Patient would like to proceed with MRI. Asking if Dr.Jertson feels this is likely the issue or if this could be GI related?  Dr.Jertson please advise, MRI pelvis only or MRI abdomen and pelvis?

## 2018-10-22 NOTE — Telephone Encounter (Signed)
Please order pelvic MRI. Yes her pain could be GI related. Fibroids don't typically cause pain unless they are degenerating. She could f/u with her primary MD as well.

## 2018-10-22 NOTE — Telephone Encounter (Signed)
Left message to call [facility] at [phone].  Notes recorded by Salvadore Dom, MD on 10/22/2018 at 9:37 AM EDT Her WBC is slightly elevated. Please check on her, see how her pain is, make sure she isn't febrile.

## 2018-10-22 NOTE — Telephone Encounter (Signed)
-----   Message from Salvadore Dom, MD sent at 10/22/2018  9:45 AM EDT ----- Please let the patient know that the formal ultrasound report is out and the radiologist recommend follow up ultrasound or MRI for confirmation. MRI would be more helpful for confirmation and for looking for signs of degeneration which could cause pain and an elevated WBC. If she is agreeable then set up the MRI.

## 2018-10-23 LAB — GC/CHLAMYDIA PROBE AMP
Chlamydia trachomatis, NAA: NEGATIVE
Neisseria Gonorrhoeae by PCR: NEGATIVE

## 2018-10-23 NOTE — Telephone Encounter (Signed)
-----   Message from Salvadore Dom, MD sent at 10/23/2018  2:40 PM EDT ----- Please advise the patient of normal results.

## 2018-10-23 NOTE — Telephone Encounter (Signed)
Routing update to Dr. Talbert Nan to review. Please advise on requested imaging, can this be ordered as STAT?

## 2018-10-23 NOTE — Telephone Encounter (Addendum)
Spoke with Prentiss Bells at [facility], confirmed order for STAT MRI pelvis with and without contrast. Was advised [facility] will contact patient directly to schedule MRI. Not currently scheduling on weekends, next available 4/6.

## 2018-10-23 NOTE — Telephone Encounter (Signed)
Spoke with patient, advised of results as seen below per Dr. Talbert Nan. Advised order placed for MRI at [facility], they will be contacting you directly to schedule. Our office will precert MRI. Patient verbalizes understanding and is agreeable.   Routing to [employer] for [employer].

## 2018-10-23 NOTE — Telephone Encounter (Signed)
Spoke with patient. Advised as seen below per Dr. Talbert Nan.   1. Patient reports she is feeling better today, pain is intermittent 3/10. Taking motrin q8 hrs prn. No fever today. BMs more regular and passing gas more often, feels this has helped. Reports some left sided tenderness at ovary, this has improved. Denies N/V.   2. Patient is requesting to proceed with MRI as previously recommended, patient is requesting MRI of abdomen and pelvis as discussed in previous call. Advised patient due to Covid-19 resitrictions, no routine outpatient imaging being scheduled until May and no routine imaging in the hospitals. Advised if symptoms not improving or worsening, f/u with PCP for further evaluation. Patient expressed concerns regarding delayed imaging of identifying "mass". Advised I will review with provider and our office will f/u with additional recommendations. Patient verbalizes understanding.

## 2018-10-23 NOTE — Telephone Encounter (Signed)
I think having a new pelvic mass, pain and patient anxiety can warrant [place] ordering this as stat.

## 2018-10-27 NOTE — Telephone Encounter (Signed)
Per review of EPIC, MRI pelvis scheduled for 10/28/18 at 9am.   Routing to Dr. Rosann Auerbach.   Encounter closed.

## 2018-10-27 NOTE — Telephone Encounter (Signed)
Per Stanton Kidney at [facility] Ref # [id number] no authorization required

## 2018-10-28 ENCOUNTER — Other Ambulatory Visit: Payer: Self-pay

## 2018-10-28 ENCOUNTER — Ambulatory Visit
Admission: RE | Admit: 2018-10-28 | Discharge: 2018-10-28 | Disposition: A | Discharge status: Home / Self Care | Payer: 59 | Source: Ambulatory Visit | Attending: Obstetrics and Gynecology | Admitting: Obstetrics and Gynecology

## 2018-10-28 DIAGNOSIS — N949 Unspecified condition associated with female genital organs and menstrual cycle: Secondary | ICD-10-CM | POA: Diagnosis not present

## 2018-10-28 MED ORDER — GADOBENATE DIMEGLUMINE 529 MG/ML IV SOLN
14.0000 mL | Freq: Once | INTRAVENOUS | Status: AC | PRN
  Administered 2018-10-28: 09:00:00 14 mL via INTRAVENOUS

## 2018-10-29 ENCOUNTER — Telehealth: Payer: Self-pay | Admitting: *Deleted

## 2018-10-29 NOTE — Telephone Encounter (Signed)
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 She has signs of adenomyosis. This is a condition where the cells lining the uterus grow into the uterine wall, it is associated with heavy cycles and or cramping. This doesn't really explain her pain. They did note a mild disc bulge in her lower back, if she is having lower back pain or leg pain she should contact her primary. CC: Dr Ashby Dawes (please send cc, not in epic)

## 2018-10-29 NOTE — Telephone Encounter (Signed)
Call to patient Left message to call back. Ask for triage nurse. 

## 2018-10-30 NOTE — Telephone Encounter (Signed)
Spoke with patient. Results given. Patient verbalizes understanding. States that she is no longer having any pain. States that is pain returns she will seek evaluation with her PCP. Reports she sees an orthopedist for her back. Denies any current concerns with her lower back. Aware will need to see her PCP or orthopedist if she has any lower back pain or leg pain. Patient is agreeable. MRI sent to patient's PCP.  Routing to provider and will close encounter.

## 2019-06-30 ENCOUNTER — Ambulatory Visit (INDEPENDENT_AMBULATORY_CARE_PROVIDER_SITE_OTHER): Payer: 59 | Admitting: Certified Nurse Midwife

## 2019-06-30 ENCOUNTER — Encounter: Payer: Self-pay | Admitting: Certified Nurse Midwife

## 2019-06-30 ENCOUNTER — Other Ambulatory Visit: Payer: Self-pay

## 2019-06-30 VITALS — BP 104/64 | HR 68 | Temp 97.1°F | Resp 16 | Ht 66.25 in | Wt 160.0 lb

## 2019-06-30 DIAGNOSIS — Z01419 Encounter for gynecological examination (general) (routine) without abnormal findings: Secondary | ICD-10-CM | POA: Diagnosis not present

## 2019-06-30 NOTE — Patient Instructions (Signed)

## 2019-06-30 NOTE — Progress Notes (Signed)
38 y.o. G0P0000 Married  Hispanic Fe here for annual exam. Periods every month with Mirena IUD, light amount, no significant cramping. Saw Dr. Ashby Dawes for aex, labs, all normal. No oral outbreaks Valtrex working well, does not need refill at this time. Was evaluated for pelvic/abdominal pain in March with MRI evaluation, no concerns noted and spontaneously resolved. No reoccurrence. Planning marriage for next year, had to postpone this year. No other health issues today.   No LMP recorded (exact date). (Menstrual status: IUD).          Sexually active: Yes.    The current method of family planning is IUD. Inserted Mirena IUD 1//10/19.   Exercising: Yes.    interval training Smoker:  no  Review of Systems  Constitutional: Negative.   HENT: Negative.   Eyes: Negative.   Respiratory: Negative.   Cardiovascular: Negative.   Gastrointestinal: Negative.   Genitourinary: Negative.   Musculoskeletal: Negative.   Skin: Negative.   Neurological: Negative.   Endo/Heme/Allergies: Negative.   Psychiatric/Behavioral: Negative.     Health Maintenance: Pap:  06-06-16 neg HPV HR neg, 06-10-18 neg HPV HR neg History of Abnormal Pap: LEEP MMG:  none Self Breast exams: no Colonoscopy:  none BMD:   none TDaP:  2013 Shingles: no Pneumonia: no Hep C and HIV: HIV neg per patient Labs: with PCP   reports that she has never smoked. She has never used smokeless tobacco. She reports current alcohol use of about 10.0 - 14.0 standard drinks of alcohol per week. She reports that she does not use drugs.  Past Medical History:  Diagnosis Date  . Abnormal Pap smear of cervix 2001  . Anxiety   . PCOS (polycystic ovarian syndrome)   . STD (sexually transmitted disease)    hsv1    Past Surgical History:  Procedure Laterality Date  . CERVICAL BIOPSY  W/ LOOP ELECTRODE EXCISION  Age 38    Paps have been normal eversince   . COLPOSCOPY    . INTRAUTERINE DEVICE (IUD) INSERTION  07/2017   Mirena      Current Outpatient Medications  Medication Sig Dispense Refill  . ALPRAZolam (XANAX) 0.5 MG tablet as needed.    Marland Kitchen BIOTIN PO Take by mouth.    . diphenhydrAMINE HCl (BENADRYL PO) Take by mouth.    Marland Kitchen ibuprofen (ADVIL,MOTRIN) 800 MG tablet Take 1 tablet (800 mg total) by mouth every 8 (eight) hours as needed. 30 tablet 1  . levonorgestrel (MIRENA) 20 MCG/24HR IUD 1 each by Intrauterine route once.    . Multiple Vitamins-Minerals (MULTIVITAMIN PO) Take by mouth daily.    . Probiotic Product (PROBIOTIC PO) Take by mouth daily.    . valACYclovir (VALTREX) 1000 MG tablet Take one bid x one day at onset of outbreaks 30 tablet 1   No current facility-administered medications for this visit.     Family History  Problem Relation Age of Onset  . Diabetes Mother   . Heart attack Paternal Grandfather   . Hypertension Father     ROS:  Pertinent items are noted in HPI.  Otherwise, a comprehensive ROS was negative.  Exam:   BP 104/64   Pulse 68   Temp (!) 97.1 F (36.2 C) (Skin)   Resp 16   Ht 5' 6.25" (1.683 m)   Wt 160 lb (72.6 kg)   LMP  (Exact Date)   BMI 25.63 kg/m  Height: 5' 6.25" (168.3 cm) Ht Readings from Last 3 Encounters:  06/30/19 5' 6.25" (1.683  m)  10/21/18 5' 6.25" (1.683 m)  06/10/18 5' 6.25" (1.683 m)    General appearance: alert, cooperative and appears stated age Head: Normocephalic, without obvious abnormality, atraumatic Neck: no adenopathy, supple, symmetrical, trachea midline and thyroid normal to inspection and palpation Lungs: clear to auscultation bilaterally Breasts: normal appearance, no masses or tenderness, No nipple retraction or dimpling, No nipple discharge or bleeding, No axillary or supraclavicular adenopathy Heart: regular rate and rhythm Abdomen: soft, non-tender; no masses,  no organomegaly Extremities: extremities normal, atraumatic, no cyanosis or edema Skin: Skin color, texture, turgor normal. No rashes or lesions Lymph nodes: Cervical,  supraclavicular, and axillary nodes normal. No abnormal inguinal nodes palpated Neurologic: Grossly normal   Pelvic: External genitalia:  no lesions              Urethra:  normal appearing urethra with no masses, tenderness or lesions              Bartholin's and Skene's: normal                 Vagina: normal appearing vagina with normal color and discharge, no lesions              Cervix: no cervical motion tenderness, no lesions and normal appearance, IUD string noted in cervix              Pap taken: No. 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  Has Rx for Valtrex, declines refill.  Pap smear: no   counseled on breast self exam, feminine hygiene, adequate intake of calcium and vitamin D, diet and exercise. Mammogram at 19.  return annually or prn  An After Visit Summary was printed and given to the patient.

## 2019-10-14 ENCOUNTER — Encounter: Payer: Self-pay | Admitting: Certified Nurse Midwife

## 2020-03-02 IMAGING — MR MRI PELVIS WITHOUT AND WITH CONTRAST
8 of 10 series · 35 of 48 positions shown · IV contrast (multihance)
Comparison: 10/21/2018 ultrasound

CLINICAL DATA: Right adnexal mass on ultrasound. Left-sided
abdominal pain, since resolved.

EXAM:
MRI PELVIS WITHOUT AND WITH CONTRAST
TECHNIQUE: Multiplanar multisequence MR imaging of the pelvis was performed
both before and after administration of intravenous contrast.
CONTRAST:  14mL MULTIHANCE GADOBENATE DIMEGLUMINE 529 MG/ML IV SOLN

[Series 2: T2 · coronal · 5.0mm · 1.25mm/px · 1 of 30 slices shown (1 of 3)]
[im 1/30]
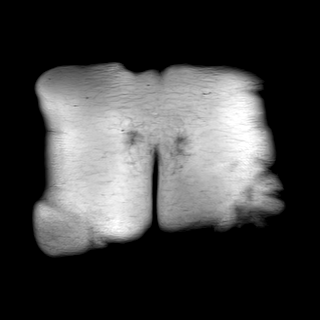

[Series 3: T2 · sagittal · 5.0mm · 0.78mm/px · 1 of 30 slices shown (2 of 3)]
[im 1/30]
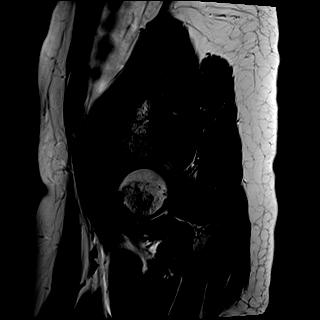

[Series 4: T2 · axial · 5.0mm · 0.41mm/px · z∈[-64,+158]mm · 2 of 38 slices shown (3 of 3)]
[im 1/38]
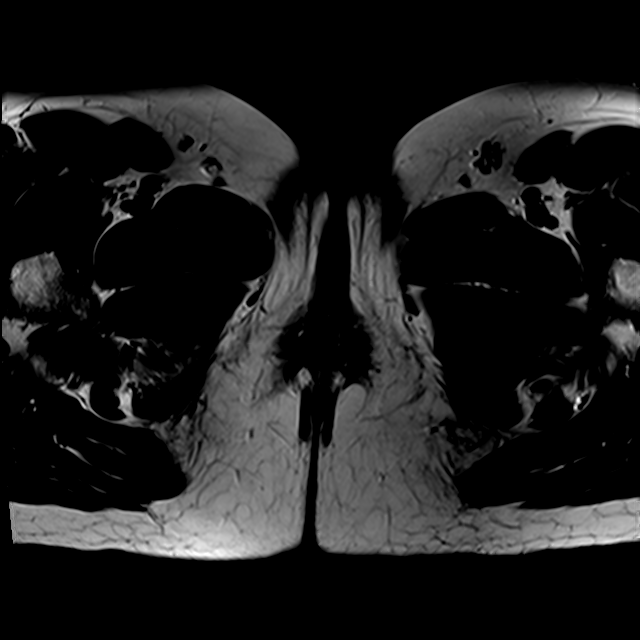
[im 38/38]
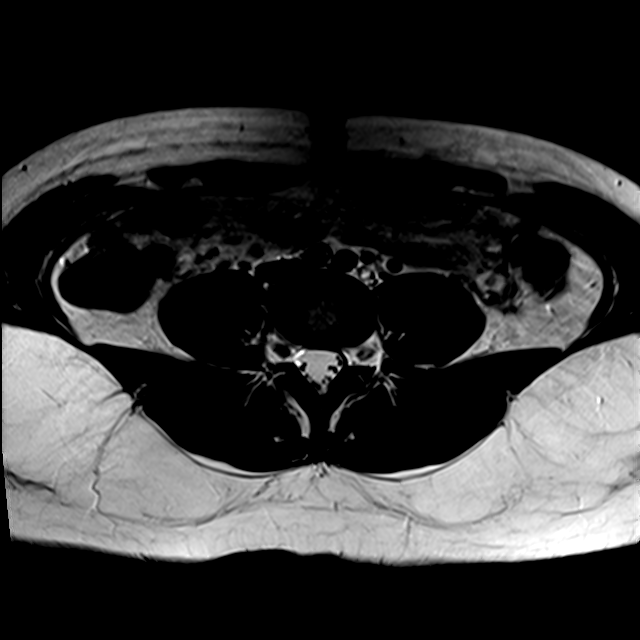

[Series 5: T2 fat-sat · axial · 5.0mm · 0.41mm/px · z∈[-64,+158]mm · 2 of 38 slices shown]
[im 1/38]
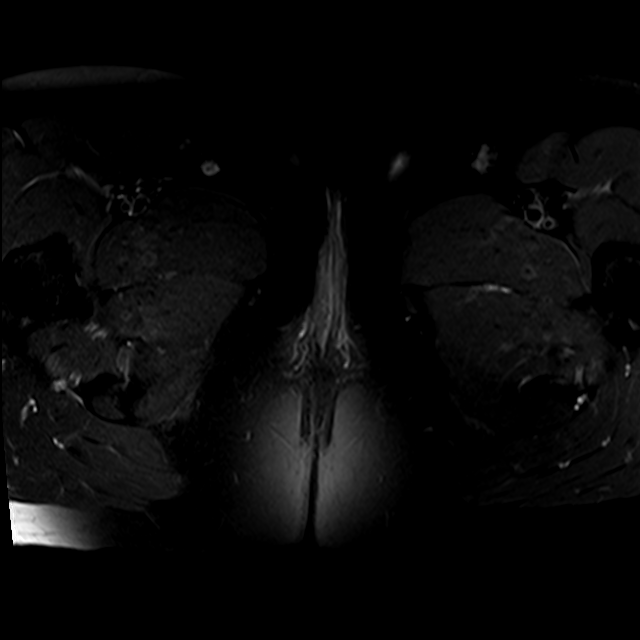
[im 38/38]
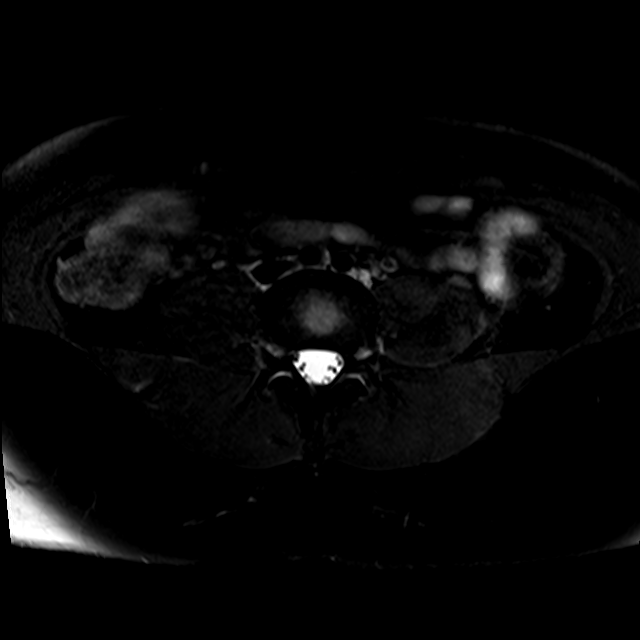

[Series 6: T1 · axial · 1.5mm · 0.75mm/px · z∈[-54,+160]mm · 8 of 144 slices shown]
[im 1/144]
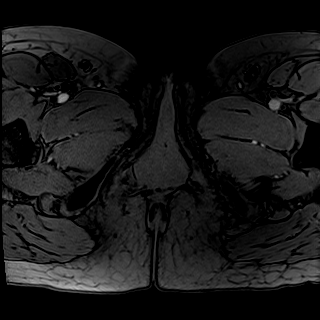
[im 21/144]
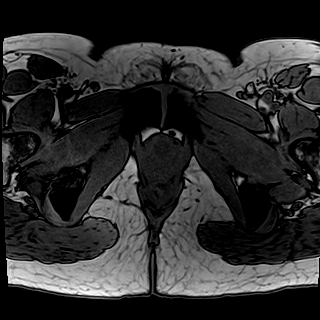
[im 41/144]
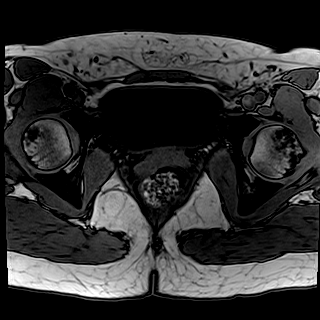
[im 62/144]
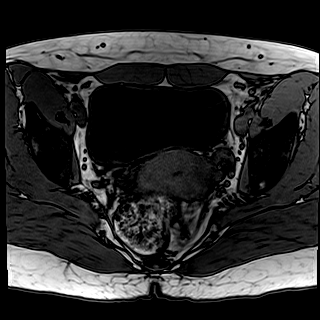
[im 82/144]
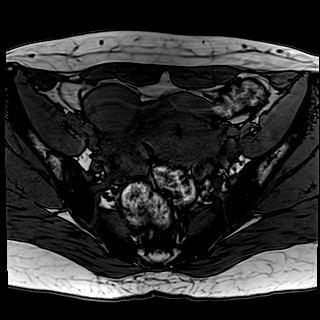
[im 103/144]
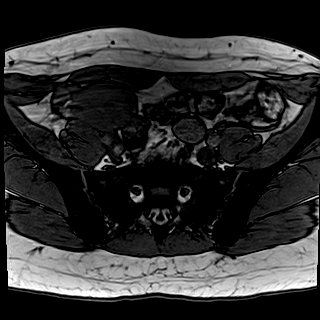
[im 123/144]
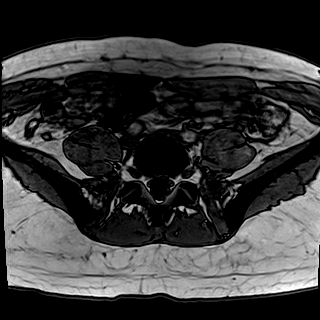
[im 144/144]
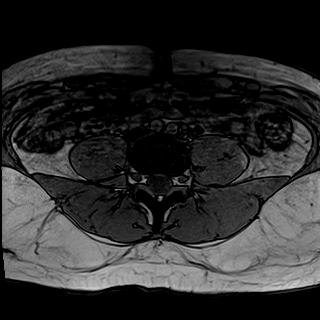

[Series 7: T1 fat-sat · axial · 1.5mm · 0.75mm/px · z∈[-54,+160]mm · 8 of 144 slices shown]
[im 1/144]
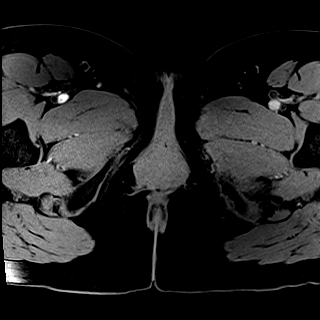
[im 21/144]
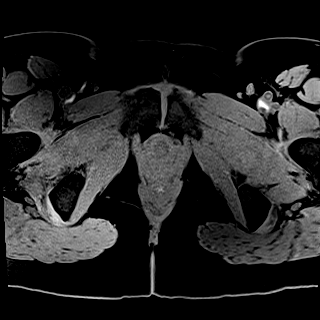
[im 41/144]
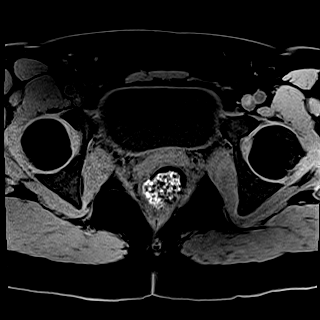
[im 62/144]
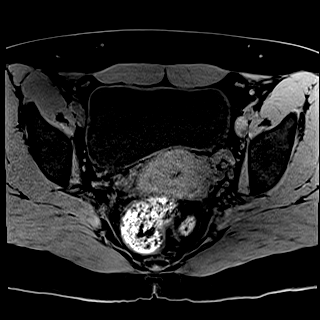
[im 82/144]
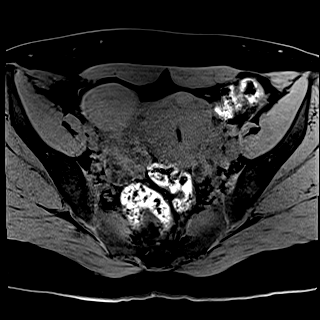
[im 103/144]
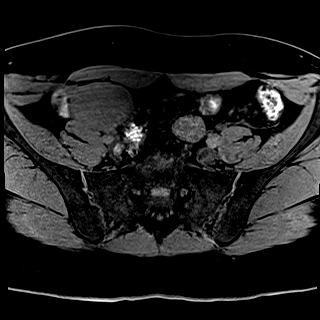
[im 123/144]
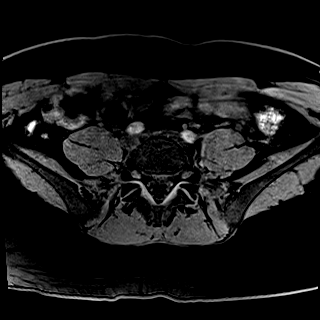
[im 144/144]
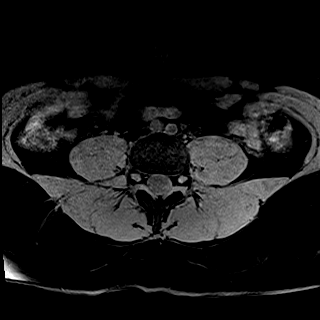

[Series 8: T1 fat-sat post-contrast · axial · 1.5mm · 0.75mm/px · z∈[-54,+160]mm · 8 of 144 slices shown (1 of 2)]
[im 1/144]
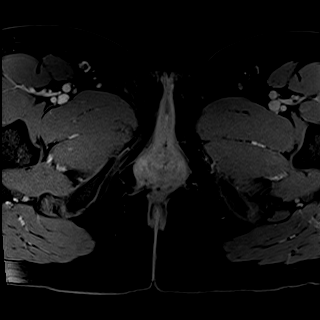
[im 21/144]
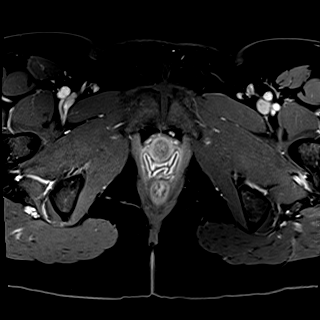
[im 41/144]
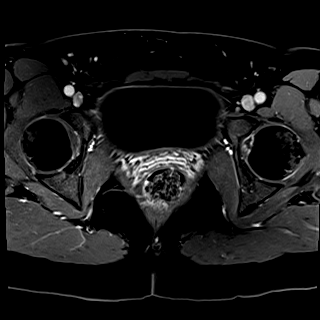
[im 62/144]
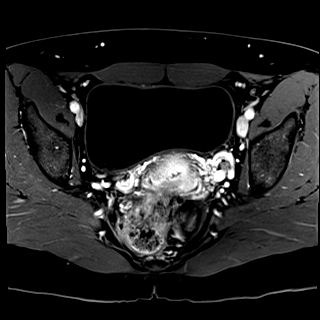
[im 82/144]
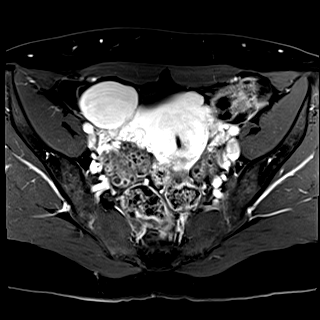
[im 103/144]
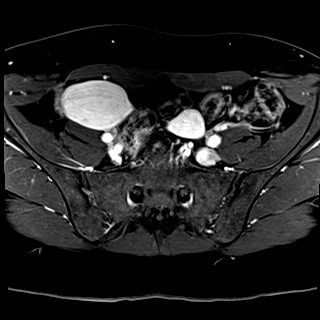
[im 123/144]
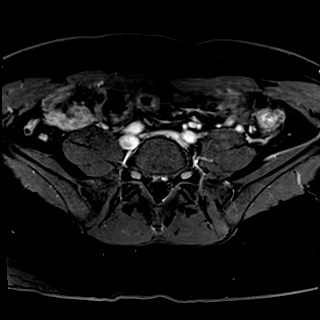
[im 144/144]
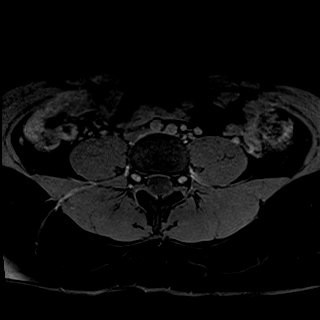

[Series 9: T1 fat-sat post-contrast · axial · 1.5mm · 0.75mm/px · z∈[-54,+67]mm · 5 of 144 slices shown (2 of 2)]
[im 1/144]
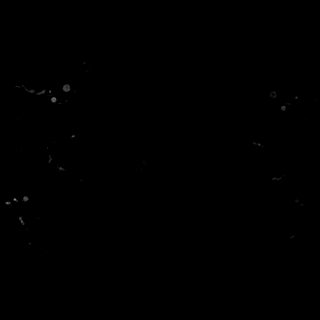
[im 21/144]
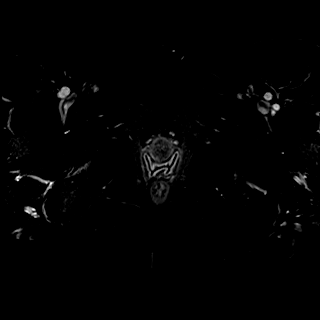
[im 41/144]
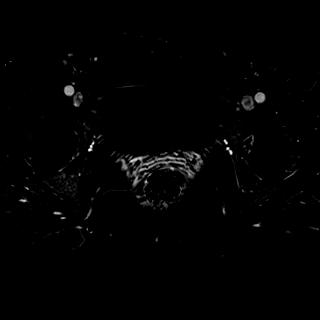
[im 62/144]
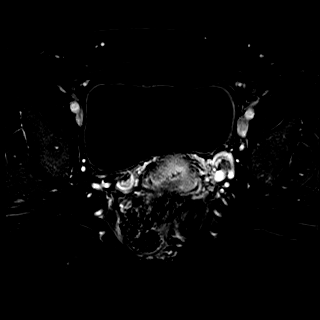
[im 82/144]
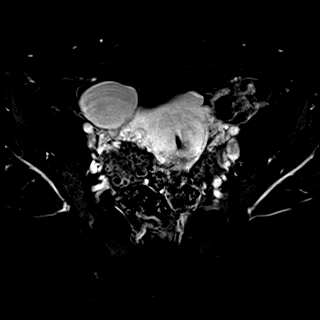

[35 of 48 positions shown; findings below may reference images not displayed]

FINDINGS: Urinary Tract: Normal urinary bladder, without distal hydroureter.

Bowel: Large colonic stool burden.  Normal pelvic small bowel.

Vascular/Lymphatic:  No pelvic aneurysm or adenopathy.

Reproductive:

Uterus: Measures 7.6 x 4.1 x 4.7 cm. Normal endometrium.
Intrauterine device. The ultrasound abnormality corresponds to a
subserosal 3.8 x 4.8 by 6.7 cm fibroid, projecting into the right
hemipelvis including image [DATE] and image [DATE]. This avidly enhances
after contrast.

There is also a left-sided subserosal fibroid off the fundus at
cm on image 42/8.

The uterine architecture is atypical within the fundus and body,
including on image [DATE]. Ill definition of junctional zone.

Right ovary: Identified on image [DATE]. Multiple small follicles
within. 3.6 x 3.7 x 3.7 cm (volume = 26 cm^3)

Left ovary: Identified on image [DATE]. Multiple small follicles
identified within. 3.0 x 2.2 by 2.8 cm (volume = 9.7 cm^3).

Other: No significant free fluid.

Musculoskeletal:  Mild disc bulge at the lumbosacral junction.
IMPRESSION: 1. The right adnexal mass represents a dominant subserosal fibroid.
Other smaller fibroids are identified.
2. Mild tiny bilateral ovarian follicles. Morphology can be seen
with polycystic ovarian syndrome. Recommend clinical correlation.
3. Altered uterine morphology diffusely. At least partially felt to
be due to smaller fibroids. Adenomyosis could look similar.
4.  Possible constipation.

## 2020-06-30 NOTE — Progress Notes (Signed)
39 y.o. G0P0000 Married Unavailable female here for annual exam.   Recently married in September. Has become step-mom to boy 65 and girl 42. She has lost 14 lbs this year with diet and exercise Will schedule mammogram. No problems with IUD. Has periodic HSV 1. Has old rx for Valtrex    No LMP recorded. (Menstrual status: IUD).          Sexually active: Yes.    The current method of family planning is IUD. Mirena inserted 08-01-17    Exercising: Yes.    [employer] Smoker:  no  Health Maintenance: Pap:  06-10-18 neg  HPV HR neg History of abnormal Pap:  LEEP 2001 MMG:  none Colonoscopy:  none BMD:   none TDaP:  2013 Gardasil:   none Covid-19: pfizer done Pneumonia vaccine(s):  Not done Shingrix:   n/a Hep C testing: not done Screening Labs: with PCP   reports that she has never smoked. She has never used smokeless tobacco. She reports current alcohol use of about 10.0 - 14.0 standard drinks of alcohol per week. She reports that she does not use drugs.  Past Medical History:  Diagnosis Date  . Abnormal Pap smear of cervix 2001  . Anxiety   . PCOS (polycystic ovarian syndrome)   . STD (sexually transmitted disease)    hsv1    Past Surgical History:  Procedure Laterality Date  . CERVICAL BIOPSY  W/ LOOP ELECTRODE EXCISION  Age 32 2001   Paps have been normal eversince   . COLPOSCOPY    . INTRAUTERINE DEVICE (IUD) INSERTION  07/2017   Mirena     Current Outpatient Medications  Medication Sig Dispense Refill  . ALPRAZolam (XANAX) 0.5 MG tablet as needed.    Marland Kitchen BIOTIN PO Take by mouth.    . diphenhydrAMINE HCl (BENADRYL PO) Take by mouth.    Marland Kitchen ibuprofen (ADVIL,MOTRIN) 800 MG tablet Take 1 tablet (800 mg total) by mouth every 8 (eight) hours as needed. 30 tablet 1  . levonorgestrel (MIRENA) 20 MCG/24HR IUD 1 each by Intrauterine route once.    . Multiple Vitamins-Minerals (MULTIVITAMIN PO) Take by mouth daily.    . Probiotic Product (PROBIOTIC PO) Take by mouth daily.    .  valACYclovir (VALTREX) 1000 MG tablet Take one bid x one day at onset of outbreaks 30 tablet 1   No current facility-administered medications for this visit.    Family History  Problem Relation Age of Onset  . Diabetes Mother   . Heart attack Paternal Grandfather   . Hypertension Father     Review of Systems  Constitutional: Negative.   HENT: Negative.   Eyes: Negative.   Respiratory: Negative.   Cardiovascular: Negative.   Gastrointestinal: Negative.   Endocrine: Negative.   Genitourinary: Negative.   Musculoskeletal: Negative.   Skin: Negative.   Allergic/Immunologic: Negative.   Neurological: Negative.   Hematological: Negative.   Psychiatric/Behavioral: Negative.     Exam:   BP 104/60   Pulse 68   Resp 16   Ht 5' 6.5" (1.689 m)   Wt 146 lb (66.2 kg)   BMI 23.21 kg/m   Height: 5' 6.5" (168.9 cm)  General appearance: alert, cooperative and appears stated age Head: Normocephalic, without obvious abnormality, atraumatic Neck: no adenopathy, supple, symmetrical, trachea midline and thyroid normal to inspection and palpation Lungs: clear to auscultation bilaterally Breasts: normal appearance, no masses or tenderness Heart: regular rate and rhythm Abdomen: soft, non-tender; bowel sounds normal; no masses,  no  organomegaly Extremities: extremities normal, atraumatic, no cyanosis or edema Skin: Skin color, texture, turgor normal. No rashes or lesions Lymph nodes: Cervical, supraclavicular, and axillary nodes normal. No abnormal inguinal nodes palpated Neurologic: Grossly normal   Pelvic: External genitalia:  no lesions              Urethra:  normal appearing urethra with no masses, tenderness or lesions              Bartholins and Skenes: normal                 Vagina: normal appearing vagina with normal color and discharge, no lesions              Cervix: no cervical motion tenderness, no lesions and strings visible              Pap taken: No. Bimanual Exam:   Uterus:  normal size, contour, position, consistency, mobility, non-tender              Adnexa: no mass, fullness, tenderness               Rectovaginal: Confirms               Anus:  normal sphincter tone, no lesions  Joy, CMA Chaperone was present for exam.  A:  Well Woman with normal exam  HSV 1  IUD surveillance (Mirena)  P:   Mammogram, pt to schedule. Information provided  pap smear, cotest last done 2020. Is 20 years s/p LEEP  Next pap due 2025  Valtrex 1 g bid x 1 day, #30, 1 rf  Labs to be done with PCP return annually or prn

## 2020-07-01 ENCOUNTER — Ambulatory Visit (INDEPENDENT_AMBULATORY_CARE_PROVIDER_SITE_OTHER): Payer: 59 | Admitting: Nurse Practitioner

## 2020-07-01 ENCOUNTER — Other Ambulatory Visit: Payer: Self-pay

## 2020-07-01 ENCOUNTER — Ambulatory Visit: Payer: 59 | Admitting: Certified Nurse Midwife

## 2020-07-01 ENCOUNTER — Encounter: Payer: Self-pay | Admitting: Nurse Practitioner

## 2020-07-01 VITALS — BP 104/60 | HR 68 | Resp 16 | Ht 66.5 in | Wt 146.0 lb

## 2020-07-01 DIAGNOSIS — B009 Herpesviral infection, unspecified: Secondary | ICD-10-CM

## 2020-07-01 DIAGNOSIS — Z30431 Encounter for routine checking of intrauterine contraceptive device: Secondary | ICD-10-CM | POA: Diagnosis not present

## 2020-07-01 DIAGNOSIS — Z01419 Encounter for gynecological examination (general) (routine) without abnormal findings: Secondary | ICD-10-CM | POA: Diagnosis not present

## 2020-07-01 MED ORDER — VALACYCLOVIR HCL 1 G PO TABS: ORAL_TABLET | ORAL | 1 refills | Status: DC

## 2020-07-01 NOTE — Patient Instructions (Signed)
Health Maintenance, Female Adopting a healthy lifestyle and getting preventive care are important in promoting health and wellness. Ask your health care provider about:  The right schedule for you to have regular tests and exams.  Things you can do on your own to prevent diseases and keep yourself healthy. What should I know about diet, weight, and exercise? Eat a healthy diet   Eat a diet that includes plenty of vegetables, fruits, low-fat dairy products, and lean protein.  Do not eat a lot of foods that are high in solid fats, added sugars, or sodium. Maintain a healthy weight Body mass index (BMI) is used to identify weight problems. It estimates body fat based on height and weight. Your health care provider can help determine your BMI and help you achieve or maintain a healthy weight. Get regular exercise Get regular exercise. This is one of the most important things you can do for your health. Most adults should:  Exercise for at least 150 minutes each week. The exercise should increase your heart rate and make you sweat (moderate-intensity exercise).  Do strengthening exercises at least twice a week. This is in addition to the moderate-intensity exercise.  Spend less time sitting. Even light physical activity can be beneficial. Watch cholesterol and blood lipids Have your blood tested for lipids and cholesterol at 40 years of age, then have this test every 5 years. Have your cholesterol levels checked more often if:  Your lipid or cholesterol levels are high.  You are older than 40 years of age.  You are at high risk for heart disease. What should I know about cancer screening? Depending on your health history and family history, you may need to have cancer screening at various ages. This may include screening for:  Breast cancer.  Cervical cancer.  Colorectal cancer.  Skin cancer.  Lung cancer. What should I know about heart disease, diabetes, and high blood  pressure? Blood pressure and heart disease  High blood pressure causes heart disease and increases the risk of stroke. This is more likely to develop in people who have high blood pressure readings, are of African descent, or are overweight.  Have your blood pressure checked: ? Every 3-5 years if you are 18-39 years of age. ? Every year if you are 40 years old or older. Diabetes Have regular diabetes screenings. This checks your fasting blood sugar level. Have the screening done:  Once every three years after age 40 if you are at a normal weight and have a low risk for diabetes.  More often and at a younger age if you are overweight or have a high risk for diabetes. What should I know about preventing infection? Hepatitis B If you have a higher risk for hepatitis B, you should be screened for this virus. Talk with your health care provider to find out if you are at risk for hepatitis B infection. Hepatitis C Testing is recommended for:  Everyone born from 1945 through 1965.  Anyone with known risk factors for hepatitis C. Sexually transmitted infections (STIs)  Get screened for STIs, including gonorrhea and chlamydia, if: ? You are sexually active and are younger than 40 years of age. ? You are older than 40 years of age and your health care provider tells you that you are at risk for this type of infection. ? Your sexual activity has changed since you were last screened, and you are at increased risk for chlamydia or gonorrhea. Ask your health care provider if   you are at risk.  Ask your health care provider about whether you are at high risk for HIV. Your health care provider may recommend a prescription medicine to help prevent HIV infection. If you choose to take medicine to prevent HIV, you should first get tested for HIV. You should then be tested every 3 months for as long as you are taking the medicine. Pregnancy  If you are about to stop having your period (premenopausal) and  you may become pregnant, seek counseling before you get pregnant.  Take 400 to 800 micrograms (mcg) of folic acid every day if you become pregnant.  Ask for birth control (contraception) if you want to prevent pregnancy. Osteoporosis and menopause Osteoporosis is a disease in which the bones lose minerals and strength with aging. This can result in bone fractures. If you are 65 years old or older, or if you are at risk for osteoporosis and fractures, ask your health care provider if you should:  Be screened for bone loss.  Take a calcium or vitamin D supplement to lower your risk of fractures.  Be given hormone replacement therapy (HRT) to treat symptoms of menopause. Follow these instructions at home: Lifestyle  Do not use any products that contain nicotine or tobacco, such as cigarettes, e-cigarettes, and chewing tobacco. If you need help quitting, ask your health care provider.  Do not use street drugs.  Do not share needles.  Ask your health care provider for help if you need support or information about quitting drugs. Alcohol use  Do not drink alcohol if: ? Your health care provider tells you not to drink. ? You are pregnant, may be pregnant, or are planning to become pregnant.  If you drink alcohol: ? Limit how much you use to 0-1 drink a day. ? Limit intake if you are breastfeeding.  Be aware of how much alcohol is in your drink. In the [place] one drink equals one 12 oz bottle of beer (355 mL), one 5 oz glass of wine (148 mL), or one 1 oz glass of hard liquor (44 mL). General instructions  Schedule regular health, dental, and eye exams.  Stay current with your vaccines.  Tell your health care provider if: ? You often feel depressed. ? You have ever been abused or do not feel safe at home. Summary  Adopting a healthy lifestyle and getting preventive care are important in promoting health and wellness.  Follow your health care provider's instructions about healthy  diet, exercising, and getting tested or screened for diseases.  Follow your health care provider's instructions on monitoring your cholesterol and blood pressure. This information is not intended to replace advice given to you by your health care provider. Make sure you discuss any questions you have with your health care provider. Document Revised: 07/02/2018 Document Reviewed: 07/02/2018 Elsevier Patient Education  2020 [employer]  

## 2020-07-04 ENCOUNTER — Other Ambulatory Visit: Payer: Self-pay | Admitting: Nurse Practitioner

## 2020-07-04 DIAGNOSIS — Z1231 Encounter for screening mammogram for malignant neoplasm of breast: Secondary | ICD-10-CM

## 2020-08-16 ENCOUNTER — Ambulatory Visit: Payer: 59

## 2020-09-14 ENCOUNTER — Ambulatory Visit: Payer: 59

## 2020-10-31 ENCOUNTER — Other Ambulatory Visit: Payer: Self-pay

## 2020-10-31 ENCOUNTER — Ambulatory Visit
Admission: RE | Admit: 2020-10-31 | Discharge: 2020-10-31 | Disposition: A | Discharge status: Home / Self Care | Payer: 59 | Source: Ambulatory Visit | Attending: Nurse Practitioner | Admitting: Nurse Practitioner

## 2020-10-31 DIAGNOSIS — Z1231 Encounter for screening mammogram for malignant neoplasm of breast: Secondary | ICD-10-CM

## 2020-11-02 ENCOUNTER — Other Ambulatory Visit: Payer: Self-pay | Admitting: Nurse Practitioner

## 2020-11-02 DIAGNOSIS — R928 Other abnormal and inconclusive findings on diagnostic imaging of breast: Secondary | ICD-10-CM

## 2020-11-29 ENCOUNTER — Other Ambulatory Visit: Payer: Self-pay | Admitting: Nurse Practitioner

## 2020-11-29 ENCOUNTER — Other Ambulatory Visit: Payer: Self-pay

## 2020-11-29 ENCOUNTER — Ambulatory Visit
Admission: RE | Admit: 2020-11-29 | Discharge: 2020-11-29 | Disposition: A | Discharge status: Home / Self Care | Payer: 59 | Source: Ambulatory Visit | Attending: Nurse Practitioner | Admitting: Nurse Practitioner

## 2020-11-29 DIAGNOSIS — R928 Other abnormal and inconclusive findings on diagnostic imaging of breast: Secondary | ICD-10-CM

## 2020-11-29 DIAGNOSIS — R921 Mammographic calcification found on diagnostic imaging of breast: Secondary | ICD-10-CM

## 2020-12-02 ENCOUNTER — Ambulatory Visit
Admission: RE | Admit: 2020-12-02 | Discharge: 2020-12-02 | Disposition: A | Discharge status: Home / Self Care | Payer: 59 | Source: Ambulatory Visit | Attending: Nurse Practitioner | Admitting: Nurse Practitioner

## 2020-12-02 ENCOUNTER — Other Ambulatory Visit: Payer: Self-pay

## 2020-12-02 DIAGNOSIS — R921 Mammographic calcification found on diagnostic imaging of breast: Secondary | ICD-10-CM

## 2020-12-02 NOTE — Progress Notes (Signed)
F/U scheduled 12/02/2020

## 2020-12-05 ENCOUNTER — Other Ambulatory Visit: Payer: Self-pay | Admitting: Nurse Practitioner

## 2020-12-05 DIAGNOSIS — C50911 Malignant neoplasm of unspecified site of right female breast: Secondary | ICD-10-CM

## 2020-12-06 ENCOUNTER — Telehealth: Payer: Self-pay | Admitting: Hematology and Oncology

## 2020-12-06 NOTE — Telephone Encounter (Signed)
Spoke to patient to confirm afternoon [facility] appointment for 5/28, emailed packet

## 2020-12-08 ENCOUNTER — Other Ambulatory Visit: Payer: Self-pay | Admitting: Nurse Practitioner

## 2020-12-08 ENCOUNTER — Ambulatory Visit
Admission: RE | Admit: 2020-12-08 | Discharge: 2020-12-08 | Disposition: A | Discharge status: Home / Self Care | Payer: 59 | Source: Ambulatory Visit | Attending: Nurse Practitioner | Admitting: Nurse Practitioner

## 2020-12-08 ENCOUNTER — Other Ambulatory Visit: Payer: Self-pay

## 2020-12-08 DIAGNOSIS — C50911 Malignant neoplasm of unspecified site of right female breast: Secondary | ICD-10-CM

## 2020-12-09 ENCOUNTER — Encounter: Payer: Self-pay | Admitting: *Deleted

## 2020-12-09 DIAGNOSIS — C50419 Malignant neoplasm of upper-outer quadrant of unspecified female breast: Secondary | ICD-10-CM | POA: Insufficient documentation

## 2020-12-09 DIAGNOSIS — D0511 Intraductal carcinoma in situ of right breast: Secondary | ICD-10-CM | POA: Insufficient documentation

## 2020-12-13 NOTE — Progress Notes (Signed)
[facility] NOTE  Patient Care Team: Merrilee Seashore, MD as PCP - General (Internal Medicine) Mauro Kaufmann, RN as Oncology Nurse Navigator Rockwell Germany, RN as Oncology Nurse Navigator Jovita Kussmaul, MD as Consulting Physician (General Surgery) Nicholas Lose, MD as Consulting Physician (Hematology and Oncology) Kyung Rudd, MD as Consulting Physician (Radiation Oncology)  CHIEF COMPLAINTS/PURPOSE OF CONSULTATION:  Newly diagnosed right breast DCIS  HISTORY OF PRESENTING ILLNESS:  Ann Buckley 40 y.o. female is here because of recent diagnosis of DCIS of the right breast. Screening mammogram on 10/31/20 showed right breast calcifications. Diagnostic mammogram and [place] on 11/29/20 showed 3 groups of calcifications in the right breast, largest 1.4cm, with two smaller measuring 0.1cm. Biopsy on 12/02/20 showed intermediate grade DCIS, ER+ 60%, PR+ 80%. She presents to the clinic today for initial evaluation and discussion of treatment options.   I reviewed her records extensively and collaborated the history with the patient.  SUMMARY OF ONCOLOGIC HISTORY: Oncology History  Ductal carcinoma in situ (DCIS) of right breast  12/09/2020 Initial Diagnosis   Screening mammogram showed right breast calcifications. Diagnostic mammogram and US showed 3 groups of calcifications in the right breast, largest 1.4cm, with two smaller measuring 0.1cm. 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   12/14/2020 Cancer Staging   Staging form: Breast, AJCC 8th Edition - Clinical stage from 12/14/2020: Stage 0 (cTis (DCIS), cN0, cM0, G2, ER+, PR+, HER2: Not Assessed) - Signed by Nicholas Lose, MD on 12/14/2020 Stage prefix: Initial diagnosis Histologic grading system: 3 grade system     MEDICAL HISTORY:  Past Medical History:  Diagnosis Date  . Abnormal Pap smear of cervix 2001  . Anxiety   . PCOS (polycystic ovarian syndrome)   . STD (sexually  transmitted disease)    [id number]    SURGICAL HISTORY: Past Surgical History:  Procedure Laterality Date  . CERVICAL BIOPSY  W/ LOOP ELECTRODE EXCISION  Age 20 2001   Paps have been normal eversince   . COLPOSCOPY    . INTRAUTERINE DEVICE (IUD) INSERTION  07/2017   Mirena     SOCIAL HISTORY: Social History   Socioeconomic History  . Marital status: Married    Spouse name: Not on file  . Number of children: Not on file  . Years of education: Not on file  . Highest education level: Not on file  Occupational History  . Not on file  Tobacco Use  . Smoking status: Never Smoker  . Smokeless tobacco: Never Used  Vaping Use  . Vaping Use: Never used  Substance and Sexual Activity  . Alcohol use: Yes    Alcohol/week: 10.0 - 14.0 standard drinks    Types: 10 - 14 Standard drinks or equivalent per week  . Drug use: Yes    Types: Marijuana    Comment: occas  . Sexual activity: Yes    Partners: Male    Birth control/protection: I.U.D.    Comment: Mirena inserted 07/2017   Other Topics Concern  . Not on file  Social History Narrative  . Not on file   Social Determinants of Health   Financial Resource Strain: Not on file  Food Insecurity: Not on file  Transportation Needs: Not on file  Physical Activity: Not on file  Stress: Not on file  Social Connections: Not on file  Intimate Partner Violence: Not on file    FAMILY HISTORY: Family History  Problem Relation Age of Onset  . Diabetes Mother   .  Heart attack Paternal Grandfather   . Hypertension Father   . Breast cancer Paternal Grandmother     ALLERGIES:  is allergic to penicillins and sulfamethoxazole-trimethoprim.  MEDICATIONS:  Current Outpatient Medications  Medication Sig Dispense Refill  . ALPRAZolam (XANAX) 0.5 MG tablet as needed.    Marland Kitchen BIOTIN PO Take by mouth.    . Multiple Vitamins-Minerals (MULTIVITAMIN PO) Take by mouth daily.    . Probiotic Product (PROBIOTIC PO) Take by mouth daily.    .  diphenhydrAMINE HCl (BENADRYL PO) Take by mouth. (Patient not taking: Reported on 12/14/2020)    . ibuprofen (ADVIL,MOTRIN) 800 MG tablet Take 1 tablet (800 mg total) by mouth every 8 (eight) hours as needed. (Patient not taking: Reported on 12/14/2020) 30 tablet 1  . levonorgestrel (MIRENA) 20 MCG/24HR IUD 1 each by Intrauterine route once.    . valACYclovir (VALTREX) 1000 MG tablet Take one bid x one day at onset of outbreaks (Patient not taking: Reported on 12/14/2020) 30 tablet 1   No current facility-administered medications for this visit.    REVIEW OF SYSTEMS:   Constitutional: Denies fevers, chills or abnormal night sweats   All other systems were reviewed with the patient and are negative.  PHYSICAL EXAMINATION: ECOG PERFORMANCE STATUS: 1 - Symptomatic but completely ambulatory  Vitals:   12/14/20 1237  BP: 119/77  Pulse: 88  Resp: 18  Temp: 98.2 F (36.8 C)  SpO2: 100%   Filed Weights   12/14/20 1237  Weight: 149 lb 8 oz (67.8 kg)       LABORATORY DATA:  I have reviewed the data as listed Lab Results  Component Value Date   WBC 7.4 12/14/2020   HGB 13.3 12/14/2020   HCT 39.9 12/14/2020   MCV 97.6 12/14/2020   PLT 295 12/14/2020   Lab Results  Component Value Date   NA 140 12/14/2020   K 3.9 12/14/2020   CL 106 12/14/2020   CO2 26 12/14/2020    RADIOGRAPHIC STUDIES: I have personally reviewed the radiological reports and agreed with the findings in the report.  ASSESSMENT AND PLAN:  Ductal carcinoma in situ (DCIS) of right breast 12/09/2020:Screening mammogram showed right breast calcifications. Diagnostic mammogram and US showed 3 groups of calcifications in the right breast, largest 1.4cm, with two smaller measuring 0.1cm. 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  Pathology review: I discussed with the patient the difference between DCIS and invasive breast cancer. It is considered a precancerous lesion. DCIS is classified as a 0. It is  generally detected through mammograms as calcifications. We discussed the significance of grades and its impact on prognosis. We also discussed the importance of ER and PR receptors and their implications to adjuvant treatment options. Prognosis of DCIS dependence on grade, comedo necrosis. It is anticipated that if not treated, 20-30% of DCIS can develop into invasive breast cancer.  Recommendation: 1. Breast conserving surgery versus participation in [facility] clinical trial 2. Followed by adjuvant radiation therapy 3. Followed by antiestrogen therapy with tamoxifen 5 years  Tamoxifen counseling: We discussed the risks and benefits of tamoxifen. These include but not limited to insomnia, hot flashes, mood changes, vaginal dryness, and weight gain. Although rare, serious side effects including endometrial cancer, risk of blood clots were also discussed. We strongly believe that the benefits far outweigh the risks. Patient understands these risks and consented to starting treatment. Planned treatment duration is 5 years.  AFT 25 COMET Phase 3 clinical trial for low risk DCIS  grade 1/2 PR positive, age greater than 40 randomized to surgery +/- radiation, +/- endocrine therapy versus active surveillance with +/- endocrine therapy surveillance with mammograms every 6 months for 5 years;patient's have option to decline elevated arm and still be followed on study   Return to clinic based on her decision regarding surgery versus clinical trial     All questions were answered. The patient knows to call the clinic with any problems, questions or concerns.   Rulon Eisenmenger, MD, MPH 12/14/2020    I, Molly Dorshimer, am acting as scribe for Nicholas Lose, MD.  I have reviewed the above documentation for accuracy and completeness, and I agree with the above.

## 2020-12-14 ENCOUNTER — Inpatient Hospital Stay (HOSPITAL_BASED_OUTPATIENT_CLINIC_OR_DEPARTMENT_OTHER): Payer: 59 | Admitting: Hematology and Oncology

## 2020-12-14 ENCOUNTER — Other Ambulatory Visit: Payer: Self-pay | Admitting: *Deleted

## 2020-12-14 ENCOUNTER — Ambulatory Visit: Payer: 59 | Admitting: Physical Therapy

## 2020-12-14 ENCOUNTER — Ambulatory Visit: Payer: Self-pay | Admitting: General Surgery

## 2020-12-14 ENCOUNTER — Inpatient Hospital Stay: Payer: 59 | Attending: Hematology and Oncology

## 2020-12-14 ENCOUNTER — Encounter: Payer: Self-pay | Admitting: *Deleted

## 2020-12-14 ENCOUNTER — Ambulatory Visit
Admission: RE | Admit: 2020-12-14 | Discharge: 2020-12-14 | Disposition: A | Discharge status: Home / Self Care | Payer: 59 | Source: Ambulatory Visit | Attending: Radiation Oncology | Admitting: Radiation Oncology

## 2020-12-14 ENCOUNTER — Other Ambulatory Visit: Payer: Self-pay

## 2020-12-14 ENCOUNTER — Ambulatory Visit (HOSPITAL_BASED_OUTPATIENT_CLINIC_OR_DEPARTMENT_OTHER): Payer: 59 | Admitting: Genetic Counselor

## 2020-12-14 DIAGNOSIS — D0511 Intraductal carcinoma in situ of right breast: Secondary | ICD-10-CM | POA: Diagnosis not present

## 2020-12-14 DIAGNOSIS — Z170 Estrogen receptor positive status [ER+]: Secondary | ICD-10-CM

## 2020-12-14 DIAGNOSIS — Z803 Family history of malignant neoplasm of breast: Secondary | ICD-10-CM | POA: Insufficient documentation

## 2020-12-14 LAB — CBC WITH DIFFERENTIAL (CANCER CENTER ONLY)
Abs Immature Granulocytes: 0.02 10*3/uL (ref 0.00–0.07)
Basophils Absolute: 0 10*3/uL (ref 0.0–0.1)
Basophils Relative: 0 %
Eosinophils Absolute: 0.1 10*3/uL (ref 0.0–0.5)
Eosinophils Relative: 1 %
HCT: 39.9 % (ref 36.0–46.0)
Hemoglobin: 13.3 g/dL (ref 12.0–15.0)
Immature Granulocytes: 0 %
Lymphocytes Relative: 32 %
Lymphs Abs: 2.4 10*3/uL (ref 0.7–4.0)
MCH: 32.5 pg (ref 26.0–34.0)
MCHC: 33.3 g/dL (ref 30.0–36.0)
MCV: 97.6 fL (ref 80.0–100.0)
Monocytes Absolute: 0.4 10*3/uL (ref 0.1–1.0)
Monocytes Relative: 5 %
Neutro Abs: 4.5 10*3/uL (ref 1.7–7.7)
Neutrophils Relative %: 62 %
Platelet Count: 295 10*3/uL (ref 150–400)
RBC: 4.09 MIL/uL (ref 3.87–5.11)
RDW: 12 % (ref 11.5–15.5)
WBC Count: 7.4 10*3/uL (ref 4.0–10.5)
nRBC: 0 % (ref 0.0–0.2)

## 2020-12-14 LAB — CMP (CANCER CENTER ONLY)
ALT: 10 U/L (ref 0–44)
AST: 16 U/L (ref 15–41)
Albumin: 4.2 g/dL (ref 3.5–5.0)
Alkaline Phosphatase: 36 U/L — ABNORMAL LOW (ref 38–126)
Anion gap: 8 (ref 5–15)
BUN: 10 mg/dL (ref 6–20)
CO2: 26 mmol/L (ref 22–32)
Calcium: 9.5 mg/dL (ref 8.9–10.3)
Chloride: 106 mmol/L (ref 98–111)
Creatinine: 0.76 mg/dL (ref 0.44–1.00)
GFR, Estimated: >60 mL/min (ref >60)
Glucose, Bld: 130 mg/dL — ABNORMAL HIGH (ref 70–99)
Potassium: 3.9 mmol/L (ref 3.5–5.1)
Sodium: 140 mmol/L (ref 135–145)
Total Bilirubin: 0.6 mg/dL (ref 0.3–1.2)
Total Protein: 7.4 g/dL (ref 6.5–8.1)

## 2020-12-14 LAB — GENETIC SCREENING ORDER

## 2020-12-14 NOTE — Progress Notes (Signed)
Radiation Oncology         ([phone]) [phone] ________________________________  Name: Ann Buckley        MRN: [id number]  Date of Service: 12/14/2020 DOB: 06-18-1981  UY:[id number], Mauro Kaufmann, MD  Jovita Kussmaul, MD     REFERRING PHYSICIAN: Jovita Kussmaul, MD   DIAGNOSIS: The encounter diagnosis was Ductal carcinoma in situ (DCIS) of right breast.   HISTORY OF PRESENT ILLNESS: Ann Buckley is a 40 y.o. female seen in the multidisciplinary breast clinic for a new diagnosis of right breast cancer. The patient was noted to have screening detected calcifications in the right breast.  Diagnostic imaging showed 4 groups of calcifications spanning 2.8 cm.  She subsequently underwent biopsy on 12/02/2020 of the upper outer right breast which revealed DCIS with calcifications, the cancer was intermediate grade.  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  She is seen today to discuss treatment recommendations of her cancer.    PREVIOUS RADIATION THERAPY: No   PAST MEDICAL HISTORY:  Past Medical History:  Diagnosis Date  . Abnormal Pap smear of cervix 2001  . Anxiety   . PCOS (polycystic ovarian syndrome)   . STD (sexually transmitted disease)    hsv1       PAST SURGICAL HISTORY: Past Surgical History:  Procedure Laterality Date  . CERVICAL BIOPSY  W/ LOOP ELECTRODE EXCISION  Age 63 2001   Paps have been normal eversince   . COLPOSCOPY    . INTRAUTERINE DEVICE (IUD) INSERTION  07/2017   Mirena      FAMILY HISTORY:  Family History  Problem Relation Age of Onset  . Diabetes Mother   . Heart attack Paternal Grandfather   . Hypertension Father      SOCIAL HISTORY:  reports that she has never smoked. She has never used smokeless tobacco. She reports current alcohol use of about 10.0 -  14.0 standard drinks of alcohol per week. She reports that she does not use drugs.  The patient is married and resides in [place].  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 She has two stepchildren in their teens. They are active with Soccer and Dance.   ALLERGIES: Penicillins and Sulfamethoxazole-trimethoprim   MEDICATIONS:  Current Outpatient Medications  Medication Sig Dispense Refill  . ALPRAZolam (XANAX) 0.5 MG tablet as needed.    Marland Kitchen BIOTIN PO Take by mouth.    . diphenhydrAMINE HCl (BENADRYL PO) Take by mouth.    Marland Kitchen ibuprofen (ADVIL,MOTRIN) 800 MG tablet Take 1 tablet (800 mg total) by mouth every 8 (eight) hours as needed. 30 tablet 1  . levonorgestrel (MIRENA) 20 MCG/24HR IUD 1 each by Intrauterine route once.    . Multiple Vitamins-Minerals (MULTIVITAMIN PO) Take by mouth daily.    . Probiotic Product (PROBIOTIC PO) Take by mouth daily.    . valACYclovir (VALTREX) 1000 MG tablet Take one bid x one day at onset of outbreaks 30 tablet 1   No current facility-administered medications for this encounter.     REVIEW OF SYSTEMS: On review of systems, the patient reports that she has some back pain that is felt to be chronic. She has a Mirena IUD for contraception. She reports she's planning to have this removed. She notes some sensitivity and tenderness of the skin since her biopsy. No other complaints are verbalized.  PHYSICAL EXAM:  Wt Readings from Last 3 Encounters:  07/01/20 146 lb (66.2 kg)  06/30/19 160 lb (72.6 kg)  10/21/18 155 lb (70.3 kg)   Temp Readings from Last 3 Encounters:  06/30/19 (!) 97.1 F (36.2 C) (Skin)  10/21/18 99.4 F (37.4 C) (Oral)   BP Readings from Last 3 Encounters:  07/01/20 104/60  06/30/19 104/64  10/21/18 120/70   Pulse Readings from Last 3 Encounters:  07/01/20 68  06/30/19 68  10/21/18 88    In general this is a well appearing female in no acute distress. She's alert and oriented x4 and appropriate  throughout the examination. Cardiopulmonary assessment is negative for acute distress and she exhibits normal effort. Bilateral breast exam is deferred.    ECOG = 1  0 - Asymptomatic (Fully active, able to carry on all predisease activities without restriction)  1 - Symptomatic but completely ambulatory (Restricted in physically strenuous activity but ambulatory and able to carry out work of a light or sedentary nature. For example, light housework, office work)  2 - Symptomatic, <50% in bed during the day (Ambulatory and capable of all self care but unable to carry out any work activities. Up and about more than 50% of waking hours)  3 - Symptomatic, >50% in bed, but not bedbound (Capable of only limited self-care, confined to bed or chair 50% or more of waking hours)  4 - Bedbound (Completely disabled. Cannot carry on any self-care. Totally confined to bed or chair)  5 - Death   Eustace Pen MM, Creech RH, Tormey DC, et al. [phone]). "Toxicity and response criteria of the [facility]". [place] Oncol. 5 (6): 649-55    LABORATORY DATA:  Lab Results  Component Value Date   WBC 12.7 (H) 10/21/2018   HGB 13.6 10/21/2018   HCT 40.2 10/21/2018   MCV 96 10/21/2018   PLT 313 10/21/2018   No results found for: NA, K, CL, CO2 No results found for: ALT, AST, GGT, ALKPHOS, BILITOT    RADIOGRAPHY: MM Digital Diagnostic Unilat R  Result Date: 11/29/2020 CLINICAL DATA:  The patient was called back for right breast calcifications EXAM: DIGITAL DIAGNOSTIC UNILATERAL RIGHT MAMMOGRAM TECHNIQUE: Right digital diagnostic mammography was performed. Mammographic images were processed with CAD. COMPARISON:  None. ACR Breast Density Category c: The breast tissue is heterogeneously dense, which may obscure small masses. FINDINGS: There are 3 groups of calcifications in the lateral right breast located near the level of the nipple on 90 degree imaging. The largest and most lateral group  spans up to 1.4 cm. Both of the other groups span on the order of 1 mm. IMPRESSION: Three indeterminate groups of calcifications in the right breast. There is a dominant group located in the far lateral right breast spanning 1.4 cm. RECOMMENDATION: Recommend stereotactic biopsy of the largest most lateral group of calcifications in the right breast. If this biopsy is benign, recommend six-month follow-up of the other 2 smaller groups. If the biopsy demonstrates malignancy or need for surgery, recommend biopsying the 2 other groups. I have discussed the findings and recommendations with the patient. If applicable, a reminder letter will be sent to the patient regarding the next appointment. BI-RADS CATEGORY  4: Suspicious. Electronically Signed   By: Dorise Bullion III M.D   On: 11/29/2020 16:25   MM CLIP PLACEMENT RIGHT  Addendum Date: 12/08/2020   ADDENDUM REPORT: 12/08/2020 15:20 ADDENDUM: The exam title should read as follows: DIAGNOSTIC RIGHT MAMMOGRAM POST STEREOTACTIC  BIOPSY Electronically Signed   By: Claudie Revering M.D.   On: 12/08/2020 15:20   Result Date: 12/08/2020 CLINICAL DATA:  Status post stereotactic guided core needle biopsies of three 1 mm groups of calcifications in the upper-outer quadrant of the right breast. EXAM: DIAGNOSTIC LEFT MAMMOGRAM POST STEREOTACTIC BIOPSY COMPARISON:  Previous exam(s). FINDINGS: Mammographic images were obtained following stereotactic guided biopsy of three 1 mm calcifications in the upper-outer quadrant of the right breast. The biopsy marking clips are at the expected positions of the sites of biopsy. The biopsied calcifications and clips span an area measuring 2.8 x 2.1 x 1.5 cm. IMPRESSION: Appropriate positioning of the X shaped, ribbon shaped and coil shaped biopsy marking clips at the sites of biopsy in the upper-outer quadrant of the right breast. Final Assessment: Post Procedure Mammograms for Marker Placement Electronically Signed: By: Claudie Revering M.D.  On: 12/08/2020 12:27   MM CLIP PLACEMENT RIGHT  Result Date: 12/02/2020 CLINICAL DATA:  Post biopsy mammogram of the right breast for clip placement. EXAM: DIAGNOSTIC RIGHT MAMMOGRAM POST STEREOTACTIC BIOPSY COMPARISON:  Previous exam(s). FINDINGS: Mammographic images were obtained following stereotactic guided biopsy of calcifications in the upper-outer right breast. The biopsy marking clip is approximately 1.4 cm medially displaced from the site of biopsy. IMPRESSION: The coil shaped biopsy marking clip is 1.4 cm medially displaced from the site of biopsy in the right breast. Final Assessment: Post Procedure Mammograms for Marker Placement Electronically Signed   By: Ammie Ferrier M.D.   On: 12/02/2020 10:13   MM RT BREAST BX W LOC DEV 1ST LESION IMAGE BX SPEC STEREO GUIDE  Addendum Date: 12/09/2020   ADDENDUM REPORT: 12/09/2020 12:58 ADDENDUM: Pathology revealed INTERMEDIATE NUCLEAR GRADE DUCTAL CARCINOMA IN SITU WITH CENTRAL NECROSIS AND CALCIFICATIONS of the Right breast, upper outer quadrant, (x-clip), 1 MM GROUP OF CALCIFICATIONS MORE POSTERIORLY LOCATED. This was found to be concordant by Dr. Claudie Revering. Pathology revealed INTERMEDIATE NUCLEAR GRADE DUCTAL CARCINOMA IN SITU WITH CENTRAL NECROSIS AND CALCIFICATIONS of the Right breast, upper outer quadrant, (ribbon clip), 1 MM GROUP OF CALCIFICATIONS POSTERIOR TO A PREVIOUSLY PLACED MIGRATED COIL CLIP . This was found to be concordant by Dr. Claudie Revering. Pathology revealed INTERMEDIATE NUCLEAR GRADE DUCTAL CARCINOMA IN SITU WITH CENTRAL NECROSIS AND CALCIFICATIONS of the Right breast, upper outer quadrant, (coil clip), 1 MM GROUP OF CALCIFICATIONS IN THE MORE LATERAL ASPECT OF THE UPPER OUTER QUADRANT. This was found to be concordant by Dr. Claudie Revering. Pathology results were discussed with the patient by telephone. The patient reported doing well after the biopsies with tenderness and bruising at the sites. Post biopsy instructions and care were  reviewed and questions were answered. The patient was encouraged to call The [facility] for any additional concerns. My direct phone number was provided. The patient has a recent diagnosis of Right breast cancer and was referred to [facility] at [facility] on Dec 14, 2020. Consideration for a bilateral breast MRI for further evaluation of extent of disease given age and breast density. Pathology results reported by Terie Purser, RN on 12/09/2020. Electronically Signed   By: Claudie Revering M.D.   On: 12/09/2020 12:58   Result Date: 12/09/2020 CLINICAL DATA:  Recently diagnosed intermediate grade DCIS upper-outer quadrant of the right breast on stereotactic guided core needle biopsy of a group of suspicious calcifications. There were 2 additional 1 mm group of calcifications in that region of the breast. Today,  a 3rd 1 mm group is demonstrated. This was discussed with the patient and she would prefer having all 3 groups biopsied today. EXAM: RIGHT BREAST STEREOTACTIC CORE NEEDLE BIOPSY X 3 COMPARISON:  Previous exams. FINDINGS: The patient and I discussed the procedure of stereotactic-guided biopsy including benefits and alternatives. We discussed the high likelihood of successful procedures. We discussed the risks of the procedures including infection, bleeding, tissue injury, clip migration, and inadequate sampling. Informed written consent was given. The usual time out protocol was performed immediately prior to the procedures. SITE 1: 1 MM GROUP OF MORE POSTERIORLY LOCATED CALCIFICATIONS IN THE UPPER OUTER QUADRANT OF THE RIGHT BREAST Using sterile technique and 1% Lidocaine as local anesthetic, under stereotactic guidance, a 9 gauge vacuum assisted device was used to perform core needle biopsy of the 1 mm group of more posteriorly located calcifications in the upper-outer quadrant of the right breast using a cephalad approach.  Specimen radiograph was performed showing calcifications in 1 of the specimens. The specimen with calcifications was identified for pathology. Lesion quadrant: Upper outer quadrant At the conclusion of the procedure, an X shaped tissue marker clip was deployed into the biopsy cavity. SITE 2: 1 MM GROUP OF CALCIFICATIONS IN THE UPPER-OUTER QUADRANT OF THE RIGHT BREAST, POSTERIOR TO A PREVIOUSLY PLACED MIGRATED COIL SHAPED CLIP Using sterile technique and 1% Lidocaine as local anesthetic, under stereotactic guidance, a 9 gauge vacuum assisted device was used to perform core needle biopsy of the 1 mm group of calcifications posterior to the previously placed migrated coil shaped clip in the upper-outer quadrant of the right breast using a cephalad approach. Specimen radiograph was performed showing calcifications in 1 specimen. The specimen with calcifications was identified for pathology. Lesion quadrant: Upper outer quadrant At the conclusion of the procedure, a ribbon shaped tissue marker clip was deployed into the biopsy cavity. SITE 3: 1 MM GROUP OF CALCIFICATIONS IN THE MORE LATERAL ASPECT OF THE UPPER-OUTER RIGHT BREAST Using sterile technique and 1% Lidocaine as local anesthetic, under stereotactic guidance, a 9 gauge vacuum assisted device was used to perform core needle biopsy of the 1 mm group of more laterally located calcifications in the upper-outer right breast using a cephalad approach. Specimen radiograph was performed showing calcifications in 1 specimen. The specimen with calcifications was identified for pathology. Lesion quadrant: Upper outer quadrant At the conclusion of the procedure, a coil shaped tissue marker clip was deployed into the biopsy cavity. Follow-up 2-view mammogram was performed and dictated separately. IMPRESSION: Stereotactic-guided biopsy of three 1 mm group of calcifications in the upper-outer quadrant of the right breast. No apparent complications. Electronically Signed: By:  Claudie Revering M.D. On: 12/08/2020 12:15   MM RT BREAST BX W LOC DEV 1ST LESION IMAGE BX SPEC STEREO GUIDE  Addendum Date: 12/06/2020   ADDENDUM REPORT: 12/06/2020 08:34 ADDENDUM: Pathology revealed INTERMEDIATE GRADE DUCTAL CARCINOMA IN SITU WITH CALCIFICATIONS of the Right breast, upper outer. This was found to be concordant by Dr. Ammie Ferrier. Pathology results were discussed with the patient by telephone. The patient reported doing well after the biopsy with tenderness at the site. Post biopsy instructions and care were reviewed and questions were answered. The patient was encouraged to call The [facility] for any additional concerns. My direct phone number was provided. The patient was referred to [facility] at [facility] on Dec 14, 2020. Consideration for a bilateral breast MRI for further evaluation of extent  of disease given age and breast density. The patient is scheduled for Right breast stereotactic guided biopsies of the other 2 smaller groups of calcifications on Dec 08, 2020. Further recommendations will be guided by the results of these biopsies. Pathology results reported by Terie Purser, RN on 12/06/2020. Electronically Signed   By: Ammie Ferrier M.D.   On: 12/06/2020 08:34   Result Date: 12/06/2020 CLINICAL DATA:  40 year old female presenting for stereotactic biopsy of right breast calcifications. EXAM: RIGHT BREAST STEREOTACTIC CORE NEEDLE BIOPSY COMPARISON:  Previous exams. FINDINGS: The patient and I discussed the procedure of stereotactic-guided biopsy including benefits and alternatives. We discussed the high likelihood of a successful procedure. We discussed the risks of the procedure including infection, bleeding, tissue injury, clip migration, and inadequate sampling. Informed written consent was given. The usual time out protocol was performed immediately prior to the procedure. Using  sterile technique and 1% Lidocaine as local anesthetic, under stereotactic guidance, a 9 gauge vacuum assisted device was used to perform core needle biopsy of calcifications in the upper-outer right breast using a lateral approach. Specimen radiograph was performed showing calcifications in multiple core samples. Specimens with calcifications are identified for pathology. Lesion quadrant: Upper outer quadrant At the conclusion of the procedure, a coil shaped tissue marker clip was deployed into the biopsy cavity. Follow-up 2-view mammogram was performed and dictated separately. IMPRESSION: Stereotactic-guided biopsy of calcifications in the upper-outer right breast. No apparent complications. Electronically Signed: By: Ammie Ferrier M.D. On: 12/02/2020 10:13   MM RT BREAST BX W LOC DEV EA AD LESION IMG BX SPEC STEREO GUIDE  Addendum Date: 12/09/2020   ADDENDUM REPORT: 12/09/2020 12:58 ADDENDUM: Pathology revealed INTERMEDIATE NUCLEAR GRADE DUCTAL CARCINOMA IN SITU WITH CENTRAL NECROSIS AND CALCIFICATIONS of the Right breast, upper outer quadrant, (x-clip), 1 MM GROUP OF CALCIFICATIONS MORE POSTERIORLY LOCATED. This was found to be concordant by Dr. Claudie Revering. Pathology revealed INTERMEDIATE NUCLEAR GRADE DUCTAL CARCINOMA IN SITU WITH CENTRAL NECROSIS AND CALCIFICATIONS of the Right breast, upper outer quadrant, (ribbon clip), 1 MM GROUP OF CALCIFICATIONS POSTERIOR TO A PREVIOUSLY PLACED MIGRATED COIL CLIP . This was found to be concordant by Dr. Claudie Revering. Pathology revealed INTERMEDIATE NUCLEAR GRADE DUCTAL CARCINOMA IN SITU WITH CENTRAL NECROSIS AND CALCIFICATIONS of the Right breast, upper outer quadrant, (coil clip), 1 MM GROUP OF CALCIFICATIONS IN THE MORE LATERAL ASPECT OF THE UPPER OUTER QUADRANT. This was found to be concordant by Dr. Claudie Revering. Pathology results were discussed with the patient by telephone. The patient reported doing well after the biopsies with tenderness and bruising at the  sites. Post biopsy instructions and care were reviewed and questions were answered. The patient was encouraged to call The [facility] for any additional concerns. My direct phone number was provided. The patient has a recent diagnosis of Right breast cancer and was referred to The [facility] at [facility] on Dec 14, 2020. Consideration for a bilateral breast MRI for further evaluation of extent of disease given age and breast density. Pathology results reported by Terie Purser, RN on 12/09/2020. Electronically Signed   By: Claudie Revering M.D.   On: 12/09/2020 12:58   Result Date: 12/09/2020 CLINICAL DATA:  Recently diagnosed intermediate grade DCIS upper-outer quadrant of the right breast on stereotactic guided core needle biopsy of a group of suspicious calcifications. There were 2 additional 1 mm group of calcifications in that region of the breast. Today, a 3rd 1  mm group is demonstrated. This was discussed with the patient and she would prefer having all 3 groups biopsied today. EXAM: RIGHT BREAST STEREOTACTIC CORE NEEDLE BIOPSY X 3 COMPARISON:  Previous exams. FINDINGS: The patient and I discussed the procedure of stereotactic-guided biopsy including benefits and alternatives. We discussed the high likelihood of successful procedures. We discussed the risks of the procedures including infection, bleeding, tissue injury, clip migration, and inadequate sampling. Informed written consent was given. The usual time out protocol was performed immediately prior to the procedures. SITE 1: 1 MM GROUP OF MORE POSTERIORLY LOCATED CALCIFICATIONS IN THE UPPER OUTER QUADRANT OF THE RIGHT BREAST Using sterile technique and 1% Lidocaine as local anesthetic, under stereotactic guidance, a 9 gauge vacuum assisted device was used to perform core needle biopsy of the 1 mm group of more posteriorly located calcifications in the upper-outer quadrant  of the right breast using a cephalad approach. Specimen radiograph was performed showing calcifications in 1 of the specimens. The specimen with calcifications was identified for pathology. Lesion quadrant: Upper outer quadrant At the conclusion of the procedure, an X shaped tissue marker clip was deployed into the biopsy cavity. SITE 2: 1 MM GROUP OF CALCIFICATIONS IN THE UPPER-OUTER QUADRANT OF THE RIGHT BREAST, POSTERIOR TO A PREVIOUSLY PLACED MIGRATED COIL SHAPED CLIP Using sterile technique and 1% Lidocaine as local anesthetic, under stereotactic guidance, a 9 gauge vacuum assisted device was used to perform core needle biopsy of the 1 mm group of calcifications posterior to the previously placed migrated coil shaped clip in the upper-outer quadrant of the right breast using a cephalad approach. Specimen radiograph was performed showing calcifications in 1 specimen. The specimen with calcifications was identified for pathology. Lesion quadrant: Upper outer quadrant At the conclusion of the procedure, a ribbon shaped tissue marker clip was deployed into the biopsy cavity. SITE 3: 1 MM GROUP OF CALCIFICATIONS IN THE MORE LATERAL ASPECT OF THE UPPER-OUTER RIGHT BREAST Using sterile technique and 1% Lidocaine as local anesthetic, under stereotactic guidance, a 9 gauge vacuum assisted device was used to perform core needle biopsy of the 1 mm group of more laterally located calcifications in the upper-outer right breast using a cephalad approach. Specimen radiograph was performed showing calcifications in 1 specimen. The specimen with calcifications was identified for pathology. Lesion quadrant: Upper outer quadrant At the conclusion of the procedure, a coil shaped tissue marker clip was deployed into the biopsy cavity. Follow-up 2-view mammogram was performed and dictated separately. IMPRESSION: Stereotactic-guided biopsy of three 1 mm group of calcifications in the upper-outer quadrant of the right breast. No  apparent complications. Electronically Signed: By: Claudie Revering M.D. On: 12/08/2020 12:15   MM RT BREAST BX W LOC DEV EA AD LESION IMG BX SPEC STEREO GUIDE  Addendum Date: 12/09/2020   ADDENDUM REPORT: 12/09/2020 12:58 ADDENDUM: Pathology revealed INTERMEDIATE NUCLEAR GRADE DUCTAL CARCINOMA IN SITU WITH CENTRAL NECROSIS AND CALCIFICATIONS of the Right breast, upper outer quadrant, (x-clip), 1 MM GROUP OF CALCIFICATIONS MORE POSTERIORLY LOCATED. This was found to be concordant by Dr. Claudie Revering. Pathology revealed INTERMEDIATE NUCLEAR GRADE DUCTAL CARCINOMA IN SITU WITH CENTRAL NECROSIS AND CALCIFICATIONS of the Right breast, upper outer quadrant, (ribbon clip), 1 MM GROUP OF CALCIFICATIONS POSTERIOR TO A PREVIOUSLY PLACED MIGRATED COIL CLIP . This was found to be concordant by Dr. Claudie Revering. Pathology revealed INTERMEDIATE NUCLEAR GRADE DUCTAL CARCINOMA IN SITU WITH CENTRAL NECROSIS AND CALCIFICATIONS of the Right breast, upper outer quadrant, (coil clip), 1 MM GROUP OF CALCIFICATIONS  IN THE MORE LATERAL ASPECT OF THE UPPER OUTER QUADRANT. This was found to be concordant by Dr. Claudie Revering. Pathology results were discussed with the patient by telephone. The patient reported doing well after the biopsies with tenderness and bruising at the sites. Post biopsy instructions and care were reviewed and questions were answered. The patient was encouraged to call The [facility] for any additional concerns. My direct phone number was provided. The patient has a recent diagnosis of Right breast cancer and was referred to [facility] on Dec 14, 2020. Consideration for a bilateral breast MRI for further evaluation of extent of disease given age and breast density. Pathology results reported by Terie Purser, RN on 12/09/2020. Electronically Signed   By: Claudie Revering M.D.   On: 12/09/2020 12:58   Result Date:  12/09/2020 CLINICAL DATA:  Recently diagnosed intermediate grade DCIS upper-outer quadrant of the right breast on stereotactic guided core needle biopsy of a group of suspicious calcifications. There were 2 additional 1 mm group of calcifications in that region of the breast. Today, a 3rd 1 mm group is demonstrated. This was discussed with the patient and she would prefer having all 3 groups biopsied today. EXAM: RIGHT BREAST STEREOTACTIC CORE NEEDLE BIOPSY X 3 COMPARISON:  Previous exams. FINDINGS: The patient and I discussed the procedure of stereotactic-guided biopsy including benefits and alternatives. We discussed the high likelihood of successful procedures. We discussed the risks of the procedures including infection, bleeding, tissue injury, clip migration, and inadequate sampling. Informed written consent was given. The usual time out protocol was performed immediately prior to the procedures. SITE 1: 1 MM GROUP OF MORE POSTERIORLY LOCATED CALCIFICATIONS IN THE UPPER OUTER QUADRANT OF THE RIGHT BREAST Using sterile technique and 1% Lidocaine as local anesthetic, under stereotactic guidance, a 9 gauge vacuum assisted device was used to perform core needle biopsy of the 1 mm group of more posteriorly located calcifications in the upper-outer quadrant of the right breast using a cephalad approach. Specimen radiograph was performed showing calcifications in 1 of the specimens. The specimen with calcifications was identified for pathology. Lesion quadrant: Upper outer quadrant At the conclusion of the procedure, an X shaped tissue marker clip was deployed into the biopsy cavity. SITE 2: 1 MM GROUP OF CALCIFICATIONS IN THE UPPER-OUTER QUADRANT OF THE RIGHT BREAST, POSTERIOR TO A PREVIOUSLY PLACED MIGRATED COIL SHAPED CLIP Using sterile technique and 1% Lidocaine as local anesthetic, under stereotactic guidance, a 9 gauge vacuum assisted device was used to perform core needle biopsy of the 1 mm group of  calcifications posterior to the previously placed migrated coil shaped clip in the upper-outer quadrant of the right breast using a cephalad approach. Specimen radiograph was performed showing calcifications in 1 specimen. The specimen with calcifications was identified for pathology. Lesion quadrant: Upper outer quadrant At the conclusion of the procedure, a ribbon shaped tissue marker clip was deployed into the biopsy cavity. SITE 3: 1 MM GROUP OF CALCIFICATIONS IN THE MORE LATERAL ASPECT OF THE UPPER-OUTER RIGHT BREAST Using sterile technique and 1% Lidocaine as local anesthetic, under stereotactic guidance, a 9 gauge vacuum assisted device was used to perform core needle biopsy of the 1 mm group of more laterally located calcifications in the upper-outer right breast using a cephalad approach. Specimen radiograph was performed showing calcifications in 1 specimen. The specimen with calcifications was identified for pathology. Lesion quadrant: Upper outer quadrant At the  conclusion of the procedure, a coil shaped tissue marker clip was deployed into the biopsy cavity. Follow-up 2-view mammogram was performed and dictated separately. IMPRESSION: Stereotactic-guided biopsy of three 1 mm group of calcifications in the upper-outer quadrant of the right breast. No apparent complications. Electronically Signed: By: Claudie Revering M.D. On: 12/08/2020 12:15       IMPRESSION/PLAN: 1. 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 Dr. Lisbeth Renshaw discusses the pathology findings and reviews the nature of noninvasive breast disease. The consensus from the breast conference includes an MRI for extent of disease followed by expectations of breast conservation with lumpectomy.  Dr. Lisbeth Renshaw discusses the rationale for external radiotherapy to the breast  to reduce risks of local recurrence followed by antiestrogen therapy. We discussed the risks, benefits, short, and long term effects of radiotherapy, as well as the curative  intent, and the patient is interested in proceeding. Dr. Lisbeth Renshaw discusses the delivery and logistics of radiotherapy and anticipates a course of 6 1/2 weeks of radiotherapy to the right breast. We will see her back a few weeks after surgery to discuss the simulation process and anticipate we starting radiotherapy about 4-6 weeks after surgery.  2. Possible genetic predisposition to malignancy. The patient is a candidate for genetic testing given her personal history and age. She was offered referral and is interested in proceeding with counseling and testing. 3. Contraceptive Counseling.  The patient uses a Mirena IUD as her form of contraception but is having this removed soon.  She is aware of the need to avoid pregnancy during radiotherapy. Her husband is planning vasectomy, but she is aware of the need for pregnancy testing prior to simulation and treatment.  In a visit lasting 60 minutes, greater than 50% of the time was spent face to face reviewing her case, as well as in preparation of, discussing, and coordinating the patient's care.  The above documentation reflects my direct findings during this shared patient visit. Please see the separate note by Dr. Lisbeth Renshaw on this date for the remainder of the patient's plan of care.    Carola Rhine, [facility]    **Disclaimer: This note was dictated with voice recognition software. Similar sounding words can inadvertently be transcribed and this note may contain transcription errors which may not have been corrected upon publication of note.**

## 2020-12-14 NOTE — Assessment & Plan Note (Signed)
12/09/2020:Screening mammogram showed right breast calcifications. Diagnostic mammogram and US showed 3 groups of calcifications in the right breast, largest 1.4cm, with two smaller measuring 0.1cm. 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  Pathology review: I discussed with the patient the difference between DCIS and invasive breast cancer. It is considered a precancerous lesion. DCIS is classified as a 0. It is generally detected through mammograms as calcifications. We discussed the significance of grades and its impact on prognosis. We also discussed the importance of ER and PR receptors and their implications to adjuvant treatment options. Prognosis of DCIS dependence on grade, comedo necrosis. It is anticipated that if not treated, 20-30% of DCIS can develop into invasive breast cancer.  Recommendation: 1. Breast conserving surgery versus participation in [facility]et clinical trial 2. Followed by adjuvant radiation therapy 3. Followed by antiestrogen therapy with tamoxifen 5 years  Tamoxifen counseling: We discussed the risks and benefits of tamoxifen. These include but not limited to insomnia, hot flashes, mood changes, vaginal dryness, and weight gain. Although rare, serious side effects including endometrial cancer, risk of blood clots were also discussed. We strongly believe that the benefits far outweigh the risks. Patient understands these risks and consented to starting treatment. Planned treatment duration is 5 years.  AFT 25 COMET Phase 3 clinical trial for low risk DCIS grade 1/2 PR positive, age greater than 40 randomized to surgery +/- radiation, +/- endocrine therapy versus active surveillance with +/- endocrine therapy surveillance with mammograms every 6 months for 5 years;patient's have option to decline elevated arm and still be followed on study   Return to clinic based on her decision regarding surgery versus clinical trial

## 2020-12-14 NOTE — Progress Notes (Signed)
error 

## 2020-12-15 ENCOUNTER — Encounter: Payer: Self-pay | Admitting: Nurse Practitioner

## 2020-12-15 ENCOUNTER — Ambulatory Visit (INDEPENDENT_AMBULATORY_CARE_PROVIDER_SITE_OTHER): Payer: 59 | Admitting: Nurse Practitioner

## 2020-12-15 ENCOUNTER — Encounter: Payer: Self-pay | Admitting: General Practice

## 2020-12-15 ENCOUNTER — Encounter: Payer: Self-pay | Admitting: Genetic Counselor

## 2020-12-15 VITALS — BP 120/78 | HR 72 | Resp 14 | Ht 64.0 in | Wt 147.4 lb

## 2020-12-15 DIAGNOSIS — Z01812 Encounter for preprocedural laboratory examination: Secondary | ICD-10-CM

## 2020-12-15 DIAGNOSIS — Z30432 Encounter for removal of intrauterine contraceptive device: Secondary | ICD-10-CM

## 2020-12-15 DIAGNOSIS — Z803 Family history of malignant neoplasm of breast: Secondary | ICD-10-CM | POA: Insufficient documentation

## 2020-12-15 LAB — PREGNANCY, URINE: Preg Test, Ur: NEGATIVE

## 2020-12-15 NOTE — Progress Notes (Signed)
REFERRING PROVIDER: Nicholas Lose, MD [place],  [place] [id number]  PRIMARY PROVIDER:  Merrilee Seashore, MD  PRIMARY REASON FOR VISIT:  1. Ductal carcinoma in situ (DCIS) of right breast   2. Family history of breast cancer      HISTORY OF PRESENT ILLNESS:   Ann Buckley, a 40 y.o. female, was seen for a [place] cancer genetics consultation at the request of Dr. Lindi Adie due to a personal and family history of cancer.  Ann Buckley presents to clinic today to discuss the possibility of a hereditary predisposition to cancer, genetic testing, and to further clarify her future cancer risks, as well as potential cancer risks for family members.   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 The tumor is ER and PR positive. The treatment plan includes surgery vs participation in COMET clinical trial, radiation therapy, and antiestrogen therapy.    CANCER HISTORY:  Oncology History  Ductal carcinoma in situ (DCIS) of right breast  12/09/2020 Initial Diagnosis   Screening mammogram showed right breast calcifications. Diagnostic mammogram and US showed 3 groups of calcifications in the right breast, largest 1.4cm, with two smaller measuring 0.1cm. 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   12/14/2020 Cancer Staging   Staging form: Breast, AJCC 8th Edition - Clinical stage from 12/14/2020: Stage 0 (cTis (DCIS), cN0, cM0, G2, ER+, PR+, HER2: Not Assessed) - Signed by Nicholas Lose, MD on 12/14/2020 Stage prefix: Initial diagnosis Histologic grading system: 3 grade system      RISK FACTORS:  Menarche was at age 28.  No live births.  OCP use for approximately 15 years.  Ovaries intact: yes.  Hysterectomy: no.  Menopausal status: premenopausal.  HRT use: 0 years. Colonoscopy: no; not examined. Mammogram within the last year: yes.   Past Medical  History:  Diagnosis Date  . Abnormal Pap smear of cervix 2001  . Anxiety   . Family history of breast cancer   . PCOS (polycystic ovarian syndrome)   . STD (sexually transmitted disease)    hsv1    Past Surgical History:  Procedure Laterality Date  . CERVICAL BIOPSY  W/ LOOP ELECTRODE EXCISION  Age 78 2001   Paps have been normal eversince   . COLPOSCOPY    . INTRAUTERINE DEVICE (IUD) INSERTION  07/2017   Mirena     Social History   Socioeconomic History  . Marital status: Married    Spouse name: Not on file  . Number of children: Not on file  . Years of education: Not on file  . Highest education level: Not on file  Occupational History  . Not on file  Tobacco Use  . Smoking status: Never Smoker  . Smokeless tobacco: Never Used  Vaping Use  . Vaping Use: Never used  Substance and Sexual Activity  . Alcohol use: Yes    Alcohol/week: 10.0 - 14.0 standard drinks    Types: 10 - 14 Standard drinks or equivalent per week  . Drug use: Yes    Types: Marijuana    Comment: occas  . Sexual activity: Yes    Partners: Male    Birth control/protection: I.U.D.    Comment: Mirena inserted 07/2017   Other Topics Concern  . Not on file  Social History Narrative  . Not on file   Social Determinants of Health   Financial Resource Strain: Not on file  Food  Insecurity: Not on file  Transportation Needs: Not on file  Physical Activity: Not on file  Stress: Not on file  Social Connections: Not on file     FAMILY HISTORY:  We obtained a detailed, 4-generation family history.  Significant diagnoses are listed below: Family History  Problem Relation Age of Onset  . Diabetes Mother   . Heart attack Paternal Grandfather   . Hypertension Father   . Breast cancer Paternal Grandmother        dx 26s  . Diabetes Maternal Grandmother   . Diabetes Maternal Grandfather    Ann Buckley does not have any biological children. She has one brother (age 108) and one sister (age  27), neither of whom have had cancer.  Ms. Bernat mother is alive at age 80 without cancer. There were four maternal aunts and six maternal uncles. There is no known cancer among maternal aunts/uncles or maternal cousins. Ms. Samet maternal grandmother is alive at age 23 without cancer. Her maternal grandfather died at age 34 without cancer.   Ann Buckley father is alive at age 13 without cancer. There is one paternal uncle, who has not had cancer. Ann Buckley paternal grandmother died at age 33 and had breast cancer diagnosed in her 50s. Her paternal grandfather died at age 45 without cancer.   Ann Buckley is unaware of previous family history of genetic testing for hereditary cancer risks. Patient's maternal ancestors are of [place], and Indigenous) descent, and paternal ancestors are of [place] and [place] descent. There is no reported Ashkenazi Jewish ancestry. There is no known consanguinity.  GENETIC COUNSELING ASSESSMENT: Ann Buckley is a 40 y.o. female with a personal and family history of breast cancer which is somewhat suggestive of a hereditary cancer syndrome and predisposition to cancer. We, therefore, discussed and recommended the following at today's visit.   DISCUSSION: We discussed that approximately 5-10% of breast cancer is hereditary, with most cases associated with the BRCA1 and BRCA2 genes. There are other genes that can be associated with hereditary breast cancer syndromes. These include ATM, CHEK2, PALB2, etc. We discussed that testing is beneficial for several reasons, including knowing about other cancer risks, identifying potential screening and risk-reduction options that may be appropriate, and to understand if other family members could be at risk for cancer and allow them to undergo genetic testing.   We reviewed the characteristics, features and inheritance patterns of  hereditary cancer syndromes. We also discussed genetic testing, including the appropriate family members to test, the process of testing, insurance coverage and turn-around-time for results. We discussed the implications of a negative, positive and/or variant of uncertain significant result. In order to get genetic test results in a timely manner so that Ann Buckley can use these genetic test results for surgical decisions, we recommended Ann Buckley pursue genetic testing for the [employer]. Once complete, we recommend Ann Buckley pursue reflex genetic testing to the CancerNext-Expanded + RNAinsight gene panel.   The BRCAplus panel offered by [employer] and includes sequencing and deletion/duplication analysis for the following 8 genes: ATM, BRCA1, BRCA2, CDH1, CHEK2, PALB2, PTEN, and TP53. The CancerNext-Expanded + RNAinsight gene panel offered by [employer] and includes sequencing and rearrangement analysis for the following 77 genes: AIP, ALK, APC, ATM, AXIN2, BAP1, BARD1, BLM, BMPR1A, BRCA1, BRCA2, BRIP1, CDC73, CDH1, CDK4, CDKN1B, CDKN2A, CHEK2, CTNNA1, DICER1, FANCC, FH, FLCN, GALNT12, KIF1B, LZTR1, MAX, MEN1, MET, MLH1, MSH2, MSH3, MSH6, MUTYH, NBN, NF1, NF2, NTHL1, PALB2, PHOX2B, PMS2, POT1,  PRKAR1A, PTCH1, PTEN, RAD51C, RAD51D, RB1, RECQL, RET, SDHA, SDHAF2, SDHB, SDHC, SDHD, SMAD4, SMARCA4, SMARCB1, SMARCE1, STK11, SUFU, TMEM127, TP53, TSC1, TSC2, VHL and XRCC2 (sequencing and deletion/duplication); EGFR, EGLN1, HOXB13, KIT, MITF, PDGFRA, POLD1 and POLE (sequencing only); EPCAM and GREM1 (deletion/duplication only). RNA data is routinely analyzed for use in variant interpretation for all genes.  Based on Ann Buckley's personal and family history of cancer, she meets medical criteria for genetic testing. Despite that she meets criteria, she may still have an out of pocket cost.   PLAN: After considering the risks, benefits, and  limitations, Ann Buckley provided informed consent to pursue genetic testing and the blood sample was sent to [facility] [employer] [facility] for analysis of the BRCAplus and CancerNext-Expanded+RNAinsight panels. Results should be available within approximately one-two weeks' time, at which point they will be disclosed by telephone to Ann Buckley, as will any additional recommendations warranted by these results. Ann Buckley will receive a summary of her genetic counseling visit and a copy of her results once available. This information will also be available in Epic.   Ann Buckley questions were answered to her satisfaction today. Our contact information was provided should additional questions or concerns arise. Thank you for the referral and allowing [place] to share in the care of your patient.   Clint Guy, [place], [facility] Licensed, Certified Dispensing [email or web address] Phone: [phone]  The patient was seen for a total of 20 minutes in face-to-face genetic counseling.  This patient was discussed with Drs. Magrinat, Lindi Adie and/or Burr Medico who agrees with the above.    _______________________________________________________________________ For Office Staff:  Number of people involved in session: 1 Was an Intern/ student involved with case: no

## 2020-12-15 NOTE — Progress Notes (Signed)
[facility] Psychosocial Distress Screening Spiritual Care  Left voicemail for [place] following  [facility] to introduce [place] team/resources, reviewing distress screen per protocol.  The patient scored a 5 on the Psychosocial Distress Thermometer which indicates moderate distress.  ONCBCN DISTRESS SCREENING 12/15/2020  Screening Type Initial Screening  Distress experienced in past week (1-10) 5  Emotional problem type Nervousness/Anxiety;Adjusting to illness  Spiritual/Religous concerns type Relating to God  Information Concerns Type Lack of info about diagnosis;Lack of info about treatment;Lack of info about complementary therapy choices  Referral to support programs Yes    Follow up needed: Yes.  Plan to follow up by phone if needed to assess for other psychosocial needs.   [place], [place], [facility] Pager [phone] Voicemail [phone]

## 2020-12-15 NOTE — Patient Instructions (Addendum)
This is the doctor that I mentioned regarding his work around cancer prevention diet:  [email or web address]  Your Mirena removal went well, your fertility returns rapidly. Just call if you decide you want that Paragard, [place] or if there is anything I can help you with.

## 2020-12-15 NOTE — Progress Notes (Signed)
GYNECOLOGY  VISIT  CC:   Mirena removal possible Paraguard IUD insertion   HPI: 40 y.o. G0P0000 Married White or Caucasian female here for IUD removal and reinsertion.  Recently diagnosed with DCIS, hormone receptor positive. Wants Mirena removed. In past had Paragard IUD, she did not like it because bleeding was very heavy. Considering it now because she feels like she doesn't have many options. Husband did make an appointment for vasectomy, but knows that isn't immediate.  After discussing, decided to hold off on Paragard insertion, for now just wants Mirena removed    Reports biopsy sites are a little uncomfortable and would like evaluated for infection.  GYNECOLOGIC HISTORY: Patient's last menstrual period was 11/19/2020. Contraception: IUD  Patient Active Problem List   Diagnosis Date Noted  . Ductal carcinoma in situ (DCIS) of right breast 12/09/2020    Past Medical History:  Diagnosis Date  . Abnormal Pap smear of cervix 2001  . Anxiety   . PCOS (polycystic ovarian syndrome)   . STD (sexually transmitted disease)    hsv1    Past Surgical History:  Procedure Laterality Date  . CERVICAL BIOPSY  W/ LOOP ELECTRODE EXCISION  Age 65 2001   Paps have been normal eversince   . COLPOSCOPY    . INTRAUTERINE DEVICE (IUD) INSERTION  07/2017   Mirena     MEDS:   Current Outpatient Medications on File Prior to Visit  Medication Sig Dispense Refill  . ALPRAZolam (XANAX) 0.5 MG tablet as needed.    Marland Kitchen BIOTIN PO Take by mouth.    . diphenhydrAMINE HCl (BENADRYL PO) Take by mouth.    Marland Kitchen ibuprofen (ADVIL,MOTRIN) 800 MG tablet Take 1 tablet (800 mg total) by mouth every 8 (eight) hours as needed. 30 tablet 1  . levonorgestrel (MIRENA) 20 MCG/24HR IUD 1 each by Intrauterine route once.    . Multiple Vitamins-Minerals (MULTIVITAMIN PO) Take by mouth daily.    . Probiotic Product (PROBIOTIC PO) Take by mouth daily.    . valACYclovir (VALTREX) 1000 MG tablet Take one bid x one day at  onset of outbreaks 30 tablet 1   No current facility-administered medications on file prior to visit.    ALLERGIES: Penicillins and Sulfamethoxazole-trimethoprim  Family History  Problem Relation Age of Onset  . Diabetes Mother   . Heart attack Paternal Grandfather   . Hypertension Father   . Breast cancer Paternal Grandmother      Review of Systems  Genitourinary:       Biopsy incisions feel uncomfortable  All other systems reviewed and are negative.   PHYSICAL EXAMINATION:    BP 120/78 (BP Location: Left Arm, Patient Position: Sitting, Cuff Size: Normal)   Pulse 72   Resp 14   Ht 5\' 4"  (1.626 m)   Wt 147 lb 6.4 oz (66.9 kg)   LMP 11/19/2020   BMI 25.30 kg/m     General appearance: alert, cooperative, no acute distress  Skin of Breast: Biopsy sites (4 sites) with small amount of raised pink tissue around them, not indurated, no drainage, mildly tender. Healing.  Pelvic: External genitalia:  no lesions              Urethra:  normal appearing urethra with no masses, tenderness or lesions              Bartholins and Skenes: normal                 Vagina: normal appearing vagina  Cervix: no lesions              Mirena strings visible and grasped with ring forceps. Mirena removed easily, intact   Assessment: Mirena removal DCIS, hormone receptor positive   Plan: Husband plans vasectomy Pt will call if desires Paragard IUD until then

## 2020-12-16 ENCOUNTER — Encounter: Payer: Self-pay | Admitting: General Practice

## 2020-12-16 NOTE — Progress Notes (Signed)
[place] Note  Reached Elisheva by phone for [facility] follow-up support. She is looking forward to a visit with her sister this weekend. [place] welcomes having a conversation partner outside of her regular social circles and plans both to review support programming info and to call me after her MRI on Wednesday for further processing. We plan to schedule a time to meet when our schedules align.   [place], [place], [facility] Pager [phone] Voicemail [phone]

## 2020-12-20 DIAGNOSIS — Z1379 Encounter for other screening for genetic and chromosomal anomalies: Secondary | ICD-10-CM | POA: Insufficient documentation

## 2020-12-21 ENCOUNTER — Ambulatory Visit
Admission: RE | Admit: 2020-12-21 | Discharge: 2020-12-21 | Disposition: A | Discharge status: Home / Self Care | Payer: 59 | Source: Ambulatory Visit | Attending: General Surgery | Admitting: General Surgery

## 2020-12-21 ENCOUNTER — Encounter: Payer: Self-pay | Admitting: Genetic Counselor

## 2020-12-21 ENCOUNTER — Telehealth: Payer: Self-pay | Admitting: Genetic Counselor

## 2020-12-21 ENCOUNTER — Other Ambulatory Visit: Payer: Self-pay

## 2020-12-21 DIAGNOSIS — D0511 Intraductal carcinoma in situ of right breast: Secondary | ICD-10-CM

## 2020-12-21 MED ORDER — GADOBUTROL 1 MMOL/ML IV SOLN
7.0000 mL | Freq: Once | INTRAVENOUS | Status: AC | PRN
  Administered 2020-12-21: 7 mL via INTRAVENOUS

## 2020-12-21 NOTE — Telephone Encounter (Signed)
Revealed negative genetic testing through the [facility] panel. Discussed that we do not know why she has breast cancer or why there is cancer in the family. It is possible that there could be a mutation in a different gene that we are not testing, or our current technology may not be able to detect certain mutations. It will therefore be important for her to stay in contact with genetics to keep up with whether additional testing may be appropriate in the future.   Additional testing through the [place] + RNAinsight panel is currently pending. We will call Ann Buckley when these results are available.

## 2020-12-22 ENCOUNTER — Ambulatory Visit: Payer: Self-pay | Admitting: General Surgery

## 2020-12-22 ENCOUNTER — Encounter: Payer: Self-pay | Admitting: General Practice

## 2020-12-22 ENCOUNTER — Encounter: Payer: Self-pay | Admitting: *Deleted

## 2020-12-22 ENCOUNTER — Telehealth: Payer: Self-pay | Admitting: *Deleted

## 2020-12-22 NOTE — Telephone Encounter (Signed)
Left message for a return phone call to follow up from [facility] 5/25.

## 2020-12-22 NOTE — Progress Notes (Signed)
[place] Spiritual Care Note  Followed up with Ann Buckley by phone, providing opportunity for her to process updates, hopes, and recent learnings, including her increasing awareness about the time and energy that she needs for healing and recovery. This is helping her scale back upcoming travel plans in anticipation of surgery. She is still waiting for MRI results to determine the surgery date, which will help her have more clarity about how, when, and what to share with family, as well as what experience she will want to seek in an [place]. Provided empathic listening, normalization of feelings, and emotional support. We plan to speak by phone next week, when she has more information, so that she can process more and I can submit an [place] referral for her.   [place], [place], [facility] Pager [phone] Voicemail [phone]

## 2020-12-23 ENCOUNTER — Other Ambulatory Visit: Payer: Self-pay | Admitting: General Surgery

## 2020-12-23 ENCOUNTER — Encounter: Payer: Self-pay | Admitting: *Deleted

## 2020-12-23 DIAGNOSIS — R9389 Abnormal findings on diagnostic imaging of other specified body structures: Secondary | ICD-10-CM

## 2020-12-26 ENCOUNTER — Encounter: Payer: Self-pay | Admitting: *Deleted

## 2020-12-28 ENCOUNTER — Other Ambulatory Visit: Payer: Self-pay | Admitting: General Surgery

## 2020-12-28 DIAGNOSIS — D0511 Intraductal carcinoma in situ of right breast: Secondary | ICD-10-CM

## 2020-12-29 ENCOUNTER — Encounter: Payer: Self-pay | Admitting: General Practice

## 2020-12-29 ENCOUNTER — Ambulatory Visit: Payer: Self-pay | Admitting: Genetic Counselor

## 2020-12-29 ENCOUNTER — Encounter: Payer: Self-pay | Admitting: Genetic Counselor

## 2020-12-29 ENCOUNTER — Other Ambulatory Visit: Payer: Self-pay

## 2020-12-29 ENCOUNTER — Telehealth: Payer: Self-pay | Admitting: Genetic Counselor

## 2020-12-29 DIAGNOSIS — Z1379 Encounter for other screening for genetic and chromosomal anomalies: Secondary | ICD-10-CM

## 2020-12-29 DIAGNOSIS — Z1509 Genetic susceptibility to other malignant neoplasm: Secondary | ICD-10-CM | POA: Insufficient documentation

## 2020-12-29 DIAGNOSIS — Z1589 Genetic susceptibility to other disease: Secondary | ICD-10-CM

## 2020-12-29 NOTE — Telephone Encounter (Signed)
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 We reviewed the cancer risks and recommendations for melanoma and possibly kidney cancer.

## 2020-12-29 NOTE — Progress Notes (Signed)
[facility] Spiritual Care Note  Followed up with Ann Buckley by phone as planned. She notes that [place] conversations are very helpful for processing, meaning-making, and coping. [name] is expecting biopsy results and care-plan developments on Tuesday, so I will phone her that afternoon per her request.    Chaplain Lorrin Jackson, [place], [facility] Pager [phone] Voicemail [phone]

## 2020-12-29 NOTE — Progress Notes (Signed)
GENETIC TEST RESULTS   Patient Name: Ann Buckley Patient Age: 40 y.o. Encounter Date: 12/29/2020  Referring Provider: Nicholas Lose, MD [address] [place],  [place] [address]    Ann Buckley was seen in the [facility] clinic on 12/14/2020 due to a personal and family history of cancer and concern regarding a hereditary predisposition to cancer in the family. Please refer to the prior Genetics clinic note for more information regarding Ann Buckley's medical and family histories and our assessment at the time.   FAMILY HISTORY:  We obtained a detailed, 4-generation family history.  Significant diagnoses are listed below: Family History  Problem Relation Age of Onset   Diabetes Mother    Heart attack Paternal Grandfather    Hypertension Father    Breast cancer Paternal Grandmother        dx 28s   Diabetes Maternal Grandmother    Diabetes Maternal Grandfather    Ann Buckley does not have any biological children. She has one brother (age 74) and one sister (age 39), neither of whom have had cancer.   [name] mother is alive at age 1 without cancer. There were four maternal aunts and six maternal uncles. There is no known cancer among maternal aunts/uncles or maternal cousins. [name] maternal grandmother is alive at age 14 without cancer. Her maternal grandfather died at age 22 without cancer.   [name] father is alive at age 21 without cancer. There is one paternal uncle, who has not had cancer. [name] paternal grandmother died at age 39 and had breast cancer diagnosed in her 38s. Her paternal grandfather died at age 64 without cancer.   [name] is unaware of previous family history of genetic testing for hereditary cancer risks. Patient's maternal ancestors are of [place] ([place], [place], and Indigenous) descent, and paternal ancestors  are of [place] and [place] descent. There is no reported Ashkenazi Jewish ancestry. There is no known consanguinity.  GENETIC TESTING: Genetic testing reported on 12/28/2020 through the CancerNext-Expanded + RNAinsight panel offered by [employer].   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 This gene mutation increases the risk to develop melanoma and possibly kidney cancer.  The CancerNext-Expanded + RNAinsight gene panel offered by [employer] and includes sequencing and rearrangement analysis for the following 77 genes: AIP, ALK, APC, ATM, AXIN2, BAP1, BARD1, BLM, BMPR1A, BRCA1, BRCA2, BRIP1, CDC73, CDH1, CDK4, CDKN1B, CDKN2A, CHEK2, CTNNA1, DICER1, FANCC, FH, FLCN, GALNT12, KIF1B, LZTR1, MAX, MEN1, MET, MLH1, MSH2, MSH3, MSH6, MUTYH, NBN, NF1, NF2, NTHL1, PALB2, PHOX2B, PMS2, POT1, PRKAR1A, PTCH1, PTEN, RAD51C, RAD51D, RB1, RECQL, RET, SDHA, SDHAF2, SDHB, SDHC, SDHD, SMAD4, SMARCA4, SMARCB1, SMARCE1, STK11, SUFU, TMEM127, TP53, TSC1, TSC2, VHL and XRCC2 (sequencing and deletion/duplication); EGFR, EGLN1, HOXB13, KIT, MITF, PDGFRA, POLD1 and POLE (sequencing only); EPCAM and GREM1 (deletion/duplication only). RNA data is routinely analyzed for use in variant interpretation for all genes. The test report will be scanned into EPIC and located under the Molecular Pathology section of the Results Review tab.  A portion of the result report is included below for reference.     [name] genetic test results do not explain why she has a personal and family history of breast cancer. We discussed that because current genetic testing is not perfect, it is possible there may be a mutation in one of these genes that current testing cannot detect, but that chance is small. We also discussed that there could be another gene  that has not yet been discovered, or that we have not yet tested, that is responsible for the cancer diagnoses in the  family. Therefore, it is important to remain in touch with cancer genetics in the future so that we can continue to offer Ann Buckley the most up to date genetic testing.  MITF CANCER RISKS & RECOMMENDATIONS:  Malignant melanoma is a neoplasm, or cancer, of melanocytes, the cells that produce pigment. Melanoma most often occurs in the skin, but may also affect the eyes, ears, gastrointestinal tract, and oral and genital membranes. Cutaneous melanoma is considered the most lethal skin cancer if not detected and treated during its early stages (PMID: 31517616). Approximately 5-10% of cases are familial (PMID: 07371062). The c.952G>A (p.Glu318Lys) variant in MITF, also known as E318K, is associated with an increased risk of melanoma (PMID: 69485462, 70350093, 81829937, 16967893, 81017510, 25852778, 24235361). The risks are not yet established; however, studies suggest the risk may be 3- to 5-fold higher than the general population risk (PMID: 44315400, 86761950). This variant has been associated with features including high nevi count (>200), fair skin, non-blue eye color, and early-onset melanoma (under age 39) (PMID: 93267124, 58099833, 82505397, 67341937). Additionally, there is evidence to suggest this variant may predispose to fast-growing melanomas (PMID: 90240973).  Studies showed an overrepresentation of renal cell carcinoma in individuals with this variant (PMID: 53299242, 68341962, 22979892, 11941740, 81448185); however, the studies were performed on relatively small patient populations and these findings have not been independently replicated. Therefore, the risk for renal cancer in individuals with the MITF E318K variant is currently unknown.  While there remains lack of a clear consensus on dermatologic management and surveillance guidelines for individuals at increased risk for melanoma, heightened screening may result in early detection and removal of cutaneous lesions at premalignant or  early stages, associated with a more favorable prognosis (PMID: 63149702).  While there are currently no established screening or surveillance guidelines for individuals with the pathogenic E318K variant in MITF, the following recommendations have been suggested (PMID: 63785885, 02774128):  Regarding melanoma risk management: Follow melanoma prevention programs that include: Sun protection strategies Regular formal dermatologic evaluations Monthly self-examination of the skin Consider immediate and urgent dermatologic follow-up for any new lesions due to evidence suggesting the E318K variant may be associated with fast-growing melanomas (PMID: 78676720). Screening of all family members Because data suggests those who test negative for a familial variant may still have an increased risk of developing melanoma (due to other shared and environmental risk factors), such relatives should remain under careful dermatologic surveillance and strict sun protection (PMID: 94709628, 36629476).  An individual's cancer risk and medical management are not determined by genetic test results alone. Overall cancer risk assessment incorporates additional factors, including personal medical history, family history, and any available genetic information that may result in a personalized plan for cancer prevention and surveillance.  Ms. Seith has a dermatologist with whom she plans to follow-up regarding the melanoma risk.  Regarding kidney cancer risk management: Ms. Docter was informed of the possible increased risk of renal cell carcinoma. We discussed that annual imaging for kidney tumors and/or regular clinical exams with a urologist may be considered, although many times imaging of the kidneys can find unexpected, but ultimately benign, findings that could require additional follow-up and testing. If she is interested in being followed by a urologist, we recommend contacting [facility]  at [phone].   FAMILY MEMBERS: It is important that all of Ann Buckley's relatives (both men and women)  know of the presence of this gene mutation. Site-specific genetic testing can sort out who in the family is at risk and who is not.  Ms. Lacap's siblings and parents each have a 50% (1 in 2) chance to have inherited this mutation. We recommend they have genetic testing for this same mutation, as identifying the presence of this mutation would allow them to also take advantage of risk-reducing measures.   Summary: The information we discussed at the time of this phone call and which is summarized in this letter is what is known as of this date. With the rapid pace of medical research, new discoveries may modify our assessment and approach to her family in the future. We, therefore, encourage Ms. Tardiff to re-contact us periodically for information on advances in the field. Ms. Bojarski may also call us with additional questions or updates regarding her personal and family cancer history. Our contact number was provided. Ms. Rautio questions were answered to her satisfaction today, and she knows she is welcome to call anytime with additional questions.    Clint Guy, MS, Willard Licesned, Certified [email or web address] Phone: [phone]

## 2020-12-30 ENCOUNTER — Ambulatory Visit (INDEPENDENT_AMBULATORY_CARE_PROVIDER_SITE_OTHER): Payer: 59 | Admitting: Plastic Surgery

## 2020-12-30 ENCOUNTER — Encounter: Payer: Self-pay | Admitting: Plastic Surgery

## 2020-12-30 VITALS — BP 107/71 | HR 78 | Ht 66.0 in | Wt 149.2 lb

## 2020-12-30 DIAGNOSIS — D0511 Intraductal carcinoma in situ of right breast: Secondary | ICD-10-CM

## 2020-12-30 NOTE — Progress Notes (Signed)
Patient ID: Ann Buckley, female    DOB: [birth year], 40 y.o.   MRN: [id number]   Chief Complaint  Patient presents with   consult    The patient is a 40 year old female here for evaluation and consultation for breast reconstruction.  The patient went for screening mammogram and was found to have some abnormalities.  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  The estrogen and progesterone are positive.  She requested an MRI which showed area of concern in the left breast and an additional area of concern in the right breast.  She is planning on having these biopsied on Monday.  She is 5 feet 6 inches tall and weighs 149 pounds.  Her preoperative bra size is a B cup to a small C cup.  She does have a breast cancer family history.  She is not a smoker does not have diabetes and was genetically tested and found to be negative.  She is seeing Dr. Marlou Starks and at this point is wanting to undergo a partial mastectomy of the right breast.  She will make a final decision about treatment after she has the results of the other 2 biopsies.  She does not have kids of her own but has 2 step kids and husband.   Review of Systems  Constitutional: Negative.  Negative for activity change and appetite change.  HENT: Negative.    Eyes: Negative.   Respiratory: Negative.    Cardiovascular: Negative.  Negative for leg swelling.  Gastrointestinal: Negative.  Negative for abdominal distention.  Endocrine: Negative.   Genitourinary: Negative.   Allergic/Immunologic: Negative.   Neurological: Negative.   Hematological: Negative.   Psychiatric/Behavioral: Negative.     Past Medical History:  Diagnosis Date   Abnormal Pap smear of cervix 2001   Anxiety    Family history of breast cancer    PCOS (polycystic ovarian syndrome)    STD (sexually transmitted disease)    hsv1    Past  Surgical History:  Procedure Laterality Date   CERVICAL BIOPSY  W/ LOOP ELECTRODE EXCISION  Age 56 2001   Paps have been normal eversince    COLPOSCOPY     INTRAUTERINE DEVICE (IUD) INSERTION  07/2017   Mirena       Current Outpatient Medications:    ALPRAZolam (XANAX) 0.5 MG tablet, as needed., Disp: , Rfl:    BIOTIN PO, Take by mouth., Disp: , Rfl:    diphenhydrAMINE HCl (BENADRYL PO), Take by mouth., Disp: , Rfl:    ibuprofen (ADVIL,MOTRIN) 800 MG tablet, Take 1 tablet (800 mg total) by mouth every 8 (eight) hours as needed., Disp: 30 tablet, Rfl: 1   Multiple Vitamins-Minerals (MULTIVITAMIN PO), Take by mouth daily., Disp: , Rfl:    Probiotic Product (PROBIOTIC PO), Take by mouth daily., Disp: , Rfl:    valACYclovir (VALTREX) 1000 MG tablet, Take one bid x one day at onset of outbreaks, Disp: 30 tablet, Rfl: 1   Objective:   Vitals:   12/30/20 1235  BP: 107/71  Pulse: 78  SpO2: 99%    Physical Exam Vitals and nursing note reviewed.  Constitutional:      Appearance: Normal appearance.  HENT:     Head: Normocephalic and atraumatic.  Eyes:     Pupils: Pupils are equal, round, and reactive to light.  Cardiovascular:     Rate and  Rhythm: Normal rate.     Pulses: Normal pulses.  Pulmonary:     Effort: Pulmonary effort is normal.  Abdominal:     General: Abdomen is flat. There is no distension.  Musculoskeletal:        General: Normal range of motion.  Skin:    General: Skin is warm.     Capillary Refill: Capillary refill takes less than 2 seconds.     Coloration: Skin is not jaundiced.  Neurological:     General: No focal deficit present.     Mental Status: She is alert and oriented to person, place, and time.  Psychiatric:        Mood and Affect: Mood normal.        Behavior: Behavior normal.        Thought Content: Thought content normal.    Assessment & Plan:  Ductal carcinoma in situ (DCIS) of right breast  The options for reconstruction we explained to  the patient / family for breast reconstruction.  There are two general categories of reconstruction.  We can reconstruction a breast with implants or use the patient's own tissue.  These were further discussed as listed.  Breast reconstruction is an optional procedure and eligibility depends on the full spectrum of the health of the patient and any co-morbidities.  More than one surgery is often needed to complete the reconstruction process.  The process can take three to twelve months to complete.  The breasts will not be identical due to many factors such as rib differences, shoulder asymmetry and treatments such as radiation.  The goal is to get the breasts to look normal and symmetrical in clothes.  Scars are a part of surgery and may fade some in time but will always be present under clothes.  Surgery may be an option on the non-cancer breast to achieve more symmetry.  No matter which procedure is chosen there is always the risk of complications and even failure of the body to heal.  This could result in no breast.    The options for reconstruction include:  1. Placement of a tissue expander with Acellular dermal matrix. When the expander is the desired size surgery is performed to remove the expander and place an implant.  In some cases the implant can be placed without an expander.  2. Autologous reconstruction can include using a muscle or tissue from another area of the body to create a breast.  3. Combined procedures (ie. latissismus dorsi flap) can be done with an expander / implant placed under the muscle.   The risks, benefits, scars and recovery time were discussed for each of the above. Risks include bleeding, infection, hematoma, seroma, scarring, pain, wound healing complications, flap loss, fat necrosis, capsular contracture, need for implant removal, donor site complications, bulge, hernia, umbilical necrosis, need for urgent reoperation, and need for dressing changes.   The procedure  the patient selected / that was best for the patient, was then discussed in further detail.  Total time: 45 minutes. This includes time spent with the patient during the visit as well as time spent before and after the visit reviewing the chart, documenting the encounter, making phone calls and reviewing studies.  Originally wanted to talk about autologous reconstruction.  But if she has bilateral mastectomies she would not really have much to make to her breasts.  We talked about implant-based reconstruction and she is going to consider that if needed.  We are planning on doing a telemetry  visit next week to discuss her options further after she has received her pathology results from her biopsies.  Pictures were obtained of the patient and placed in the chart with the patient's or guardian's permission.  [place], DO

## 2021-01-02 ENCOUNTER — Ambulatory Visit
Admission: RE | Admit: 2021-01-02 | Discharge: 2021-01-02 | Disposition: A | Discharge status: Home / Self Care | Payer: 59 | Source: Ambulatory Visit | Attending: General Surgery | Admitting: General Surgery

## 2021-01-02 ENCOUNTER — Other Ambulatory Visit: Payer: Self-pay

## 2021-01-02 ENCOUNTER — Other Ambulatory Visit (HOSPITAL_COMMUNITY): Payer: Self-pay | Admitting: Diagnostic Radiology

## 2021-01-02 DIAGNOSIS — R9389 Abnormal findings on diagnostic imaging of other specified body structures: Secondary | ICD-10-CM

## 2021-01-02 MED ORDER — GADOBUTROL 1 MMOL/ML IV SOLN
8.0000 mL | Freq: Once | INTRAVENOUS | Status: AC | PRN
  Administered 2021-01-02: 8 mL via INTRAVENOUS

## 2021-01-03 ENCOUNTER — Encounter: Payer: Self-pay | Admitting: General Practice

## 2021-01-03 ENCOUNTER — Encounter: Payer: Self-pay | Admitting: *Deleted

## 2021-01-03 NOTE — Progress Notes (Signed)
[place] Note  Received and returned voicemail from [place], [place] referral per her request.   Pauline Good, [facility] Pager [phone] Voicemail [phone]

## 2021-01-06 ENCOUNTER — Telehealth: Payer: Self-pay | Admitting: Hematology and Oncology

## 2021-01-06 NOTE — Telephone Encounter (Signed)
Sch per 6/14 sch message, pt aware

## 2021-01-10 NOTE — Pre-Procedure Instructions (Addendum)
Surgical Instructions:    Your procedure is scheduled on Wednesday, June 29th (11:00 AM- 12 Noon).  Report to [facility] Main Entrance "A" at 09:00 A.M., then check in with the Admitting office.  Call this number if you have any questions prior to your surgery date, or have problems the morning of surgery:  [phone]    Remember:  Do not eat after midnight the night before your surgery.  You may drink clear liquids until 08:00 AM the morning of your surgery.   Clear liquids allowed are: Water, Non-Citrus Juices (without pulp), Carbonated Beverages, Clear Tea, Black Coffee Only, and Gatorade.    Take these medicines the morning of surgery with A SIP OF WATER:    IF NEEDED: ALPRAZolam (XANAX) fluticasone (FLONASE) nasal spray   As of today, STOP taking any Aspirin (unless otherwise instructed by your surgeon) Aleve, Naproxen, Ibuprofen, Motrin, Advil, Goody's, BC's, all herbal medications, fish oil, and all vitamins.             Special instructions:    [place]- Preparing For Surgery  Before surgery, you can play an important role. Because skin is not sterile, your skin needs to be as free of germs as possible. You can reduce the number of germs on your skin by washing with CHG (chlorahexidine gluconate) Soap before surgery.  CHG is an antiseptic cleaner which kills germs and bonds with the skin to continue killing germs even after washing.     Please do not use if you have an allergy to CHG or antibacterial soaps. If your skin becomes reddened/irritated stop using the CHG.  Do not shave (including legs and underarms) for at least 48 hours prior to first CHG shower. It is OK to shave your face.  Please follow these instructions carefully.     Shower the NIGHT BEFORE SURGERY and the MORNING OF SURGERY with CHG Soap.   If you chose to wash your hair, wash your hair first as usual with your normal shampoo. After you shampoo, rinse your hair and body thoroughly to remove the  shampoo.  Then [employer] and genitals (private parts) with your normal soap and rinse thoroughly to remove soap.  After that Use CHG Soap as you would any other liquid soap. You can apply CHG directly to the skin and wash gently with a scrungie or a clean washcloth.   Apply the CHG Soap to your body ONLY FROM THE NECK DOWN.  Do not use on open wounds or open sores. Avoid contact with your eyes, ears, mouth and genitals (private parts). Wash Face and genitals (private parts)  with your normal soap.   Wash thoroughly, paying special attention to the area where your surgery will be performed.  Thoroughly rinse your body with warm water from the neck down.  DO NOT shower/wash with your normal soap after using and rinsing off the CHG Soap.  Pat yourself dry with a CLEAN TOWEL.  Wear CLEAN PAJAMAS to bed the night before surgery  Place CLEAN SHEETS on your bed the night before your surgery  DO NOT SLEEP WITH PETS.   Day of Surgery:  Take a shower with CHG soap. Wear Clean/Comfortable clothing the morning of surgery Do not wear lotions, powders, perfumes, or deodorant.   Remember to brush your teeth WITH YOUR REGULAR TOOTHPASTE. Do not wear jewelry or makeup. DO Not wear nail polish, gel polish, artificial nails, or any other type of covering on natural nails including finger and toenails. If  patients have artificial nails, gel coating, etc. that need to be removed by a nail salon please have this removed prior to surgery or surgery may need to be canceled/delayed if the surgeon/ anesthesia feels like the patient is unable to be adequately monitored. Do not shave 48 hours prior to surgery.   Do not bring valuables to the hospital. [facility] is not responsible for any belongings or valuables.  Do NOT Smoke (Tobacco/Vaping) or drink Alcohol 24 hours prior to your procedure.  If you use a CPAP at night, you may bring all equipment for your overnight stay.   Contacts, glasses, dentures or  bridgework may not be worn into surgery, please bring cases for these belongings.   For patients admitted to the hospital, discharge time will be determined by your treatment team.  Patients discharged the day of surgery will not be allowed to drive home, and someone needs to stay with them for 24 hours.  ONLY 1 SUPPORT PERSON MAY BE PRESENT WHILE YOU ARE IN SURGERY. IF YOU ARE TO BE ADMITTED ONCE YOU ARE IN YOUR ROOM YOU WILL BE ALLOWED TWO (2) VISITORS.  Minor children may have two parents present. Special consideration for safety and communication needs will be reviewed on a case by case basis.     Please read over the following fact sheets that you were given.

## 2021-01-11 ENCOUNTER — Other Ambulatory Visit: Payer: Self-pay

## 2021-01-11 ENCOUNTER — Encounter (HOSPITAL_COMMUNITY)
Admission: RE | Admit: 2021-01-11 | Discharge: 2021-01-11 | Disposition: A | Discharge status: Home / Self Care | Payer: 59 | Source: Ambulatory Visit | Attending: General Surgery | Admitting: General Surgery

## 2021-01-11 ENCOUNTER — Encounter (HOSPITAL_COMMUNITY): Payer: Self-pay

## 2021-01-11 DIAGNOSIS — Z01812 Encounter for preprocedural laboratory examination: Secondary | ICD-10-CM | POA: Diagnosis not present

## 2021-01-11 LAB — COMPREHENSIVE METABOLIC PANEL
ALT: 15 U/L (ref 0–44)
AST: 19 U/L (ref 15–41)
Albumin: 4.2 g/dL (ref 3.5–5.0)
Alkaline Phosphatase: 32 U/L — ABNORMAL LOW (ref 38–126)
Anion gap: 7 (ref 5–15)
BUN: 9 mg/dL (ref 6–20)
CO2: 24 mmol/L (ref 22–32)
Calcium: 9.6 mg/dL (ref 8.9–10.3)
Chloride: 105 mmol/L (ref 98–111)
Creatinine, Ser: 0.77 mg/dL (ref 0.44–1.00)
GFR, Estimated: >60 mL/min (ref >60)
Glucose, Bld: 84 mg/dL (ref 70–99)
Potassium: 4.1 mmol/L (ref 3.5–5.1)
Sodium: 136 mmol/L (ref 135–145)
Total Bilirubin: 1.1 mg/dL (ref 0.3–1.2)
Total Protein: 7.6 g/dL (ref 6.5–8.1)

## 2021-01-11 LAB — POCT PREGNANCY, URINE: Preg Test, Ur: NEGATIVE

## 2021-01-11 LAB — CBC
HCT: 41.4 % (ref 36.0–46.0)
Hemoglobin: 14.1 g/dL (ref 12.0–15.0)
MCH: 33.3 pg (ref 26.0–34.0)
MCHC: 34.1 g/dL (ref 30.0–36.0)
MCV: 97.6 fL (ref 80.0–100.0)
Platelets: 322 10*3/uL (ref 150–400)
RBC: 4.24 MIL/uL (ref 3.87–5.11)
RDW: 12.1 % (ref 11.5–15.5)
WBC: 5.3 10*3/uL (ref 4.0–10.5)
nRBC: 0 % (ref 0.0–0.2)

## 2021-01-11 NOTE — Progress Notes (Signed)
PCP - Hollace Kinnier, MD Cardiologist - Denies GYN- Karma Ganja, NP  PPM/ICD - Denies  Chest x-ray - N/A EKG - N/A Stress Test - Denies ECHO - Denies Cardiac Cath - Denies  Sleep Study - Denies  Pt is not diabetic.  Blood Thinner Instructions: N/A Aspirin Instructions: N/A  ERAS Protcol - Yes PRE-SURGERY Ensure or G2- Not ordered  COVID TEST- N/A; Ambulatory sx   Anesthesia review: No  Patient denies shortness of breath, fever, cough and chest pain at PAT appointment   All instructions explained to the patient, with a verbal understanding of the material. Patient agrees to go over the instructions while at home for a better understanding. Patient also instructed to self quarantine after being tested for COVID-19. The opportunity to ask questions was provided.

## 2021-01-13 ENCOUNTER — Other Ambulatory Visit: Payer: Self-pay

## 2021-01-13 ENCOUNTER — Encounter: Payer: Self-pay | Admitting: General Practice

## 2021-01-13 ENCOUNTER — Inpatient Hospital Stay: Payer: 59 | Attending: Hematology and Oncology | Admitting: General Practice

## 2021-01-13 NOTE — Progress Notes (Signed)
[place] Social Work  Clinical Social Work was referred by [facility]  to review and complete healthcare advance directives.  Clinical Social Worker and Creola Corn met with patient in [employer].  The patient designated Marolyn Hammock Schoppe as their primary healthcare agent and Renaye Rakers as their secondary agent.  Patient also completed healthcare living will.    Clinical Social Worker notarized documents and made copies for patient/family. Clinical Social Worker will send documents to medical records to be scanned into patient's chart. Clinical Social Worker encouraged patient/family to contact with any additional questions or concerns.  Edwyna Shell, LCSW Clinical Social Worker Phone:  [phone]

## 2021-01-17 ENCOUNTER — Ambulatory Visit
Admission: RE | Admit: 2021-01-17 | Discharge: 2021-01-17 | Disposition: A | Discharge status: Home / Self Care | Payer: 59 | Source: Ambulatory Visit | Attending: General Surgery | Admitting: General Surgery

## 2021-01-17 ENCOUNTER — Encounter: Payer: Self-pay | Admitting: Plastic Surgery

## 2021-01-17 ENCOUNTER — Other Ambulatory Visit: Payer: Self-pay

## 2021-01-17 ENCOUNTER — Telehealth (INDEPENDENT_AMBULATORY_CARE_PROVIDER_SITE_OTHER): Payer: 59 | Admitting: Plastic Surgery

## 2021-01-17 DIAGNOSIS — D0511 Intraductal carcinoma in situ of right breast: Secondary | ICD-10-CM

## 2021-01-17 NOTE — Progress Notes (Signed)
   Subjective:    Patient ID: Ann Buckley, female    DOB: 12/21/1980, 40 y.o.   MRN: [id number]  The patient is a 40 year old female joining me by televisit.  She had some additional questions after her consultation for breast reconstruction.  She had a screening mammogram done and was found to have some irregularities that led to a diagnosis of ductal carcinoma in situ of the right breast.  It is estrogen and progesterone positive.  The MRI showed some additional areas but they were all benign.  She is 5 feet 6 inches tall weighs 149 pounds.  Her preoperative bra size is a B/C cup.  She has a family history of breast cancer.  She is not a smoker and does not have diabetes.  She is planning on undergoing a right partial mastectomy tomorrow with Dr. Marlou Starks.    Review of Systems none     Objective:   Physical Exam televisit      Assessment & Plan:     ICD-10-CM   1. Ductal carcinoma in situ (DCIS) of right breast  D05.11       I connected with  Juline Duynhouwer-Schoppe on 01/17/21 by a video enabled telemedicine application and verified that I am speaking with the correct person using two identifiers.  The patient was at home and I was at the office. I spent 10 minutes in the following way: Discussion with patient, review of chart, charting and reviewing MRI information.  She will also undergo radiation.  She will reach back out to [place] for follow-up once she has completed her treatment.  The possibility would be for a mastopexy possible reduction of the contralateral breast which would be the left breast. I discussed the limitations of evaluation and management by telemedicine. The patient expressed understanding and agreed to proceed.

## 2021-01-18 ENCOUNTER — Ambulatory Visit (HOSPITAL_COMMUNITY): Payer: 59 | Admitting: Anesthesiology

## 2021-01-18 ENCOUNTER — Ambulatory Visit
Admission: RE | Admit: 2021-01-18 | Discharge: 2021-01-18 | Disposition: A | Discharge status: Home / Self Care | Payer: 59 | Source: Ambulatory Visit | Attending: General Surgery | Admitting: General Surgery

## 2021-01-18 ENCOUNTER — Encounter (HOSPITAL_COMMUNITY): Payer: Self-pay | Admitting: General Surgery

## 2021-01-18 ENCOUNTER — Other Ambulatory Visit: Payer: Self-pay

## 2021-01-18 ENCOUNTER — Encounter (HOSPITAL_COMMUNITY)
Admission: RE | Disposition: A | Discharge status: Home / Self Care | Payer: Self-pay | Source: Home / Self Care | Attending: General Surgery

## 2021-01-18 ENCOUNTER — Ambulatory Visit (HOSPITAL_COMMUNITY)
Admission: RE | Admit: 2021-01-18 | Discharge: 2021-01-18 | Disposition: A | Discharge status: Home / Self Care | Payer: 59 | Attending: General Surgery | Admitting: General Surgery

## 2021-01-18 DIAGNOSIS — Z975 Presence of (intrauterine) contraceptive device: Secondary | ICD-10-CM | POA: Diagnosis not present

## 2021-01-18 DIAGNOSIS — Z881 Allergy status to other antibiotic agents status: Secondary | ICD-10-CM | POA: Diagnosis not present

## 2021-01-18 DIAGNOSIS — C50911 Malignant neoplasm of unspecified site of right female breast: Secondary | ICD-10-CM | POA: Diagnosis not present

## 2021-01-18 DIAGNOSIS — Z793 Long term (current) use of hormonal contraceptives: Secondary | ICD-10-CM | POA: Diagnosis not present

## 2021-01-18 DIAGNOSIS — Z880 Allergy status to penicillin: Secondary | ICD-10-CM | POA: Diagnosis not present

## 2021-01-18 DIAGNOSIS — D0511 Intraductal carcinoma in situ of right breast: Secondary | ICD-10-CM

## 2021-01-18 DIAGNOSIS — Z170 Estrogen receptor positive status [ER+]: Secondary | ICD-10-CM | POA: Diagnosis not present

## 2021-01-18 DIAGNOSIS — N6011 Diffuse cystic mastopathy of right breast: Secondary | ICD-10-CM | POA: Diagnosis not present

## 2021-01-18 DIAGNOSIS — C50919 Malignant neoplasm of unspecified site of unspecified female breast: Secondary | ICD-10-CM

## 2021-01-18 DIAGNOSIS — Z79899 Other long term (current) drug therapy: Secondary | ICD-10-CM | POA: Diagnosis not present

## 2021-01-18 HISTORY — DX: Malignant neoplasm of unspecified site of unspecified female breast: C50.919

## 2021-01-18 HISTORY — PX: BREAST LUMPECTOMY WITH RADIOACTIVE SEED LOCALIZATION: SHX6424

## 2021-01-18 HISTORY — PX: BREAST LUMPECTOMY: SHX2

## 2021-01-18 SURGERY — BREAST LUMPECTOMY WITH RADIOACTIVE SEED LOCALIZATION
Anesthesia: General | Site: Breast | Laterality: Right

## 2021-01-18 MED ORDER — OXYCODONE HCL 5 MG PO TABS: 5.0000 mg | ORAL_TABLET | Freq: Once | ORAL | Status: DC | PRN

## 2021-01-18 MED ORDER — PROMETHAZINE HCL 25 MG/ML IJ SOLN: 6.2500 mg | INTRAMUSCULAR | Status: DC | PRN

## 2021-01-18 MED ORDER — EPHEDRINE SULFATE-NACL 50-0.9 MG/10ML-% IV SOSY
PREFILLED_SYRINGE | INTRAVENOUS | Status: DC | PRN
  Administered 2021-01-18: 15 mg via INTRAVENOUS
  Administered 2021-01-18: 5 mg via INTRAVENOUS
  Administered 2021-01-18: 10 mg via INTRAVENOUS

## 2021-01-18 MED ORDER — GABAPENTIN 300 MG PO CAPS
300.0000 mg | ORAL_CAPSULE | ORAL | Status: AC
  Administered 2021-01-18: 300 mg via ORAL
  Filled 2021-01-18: qty 1

## 2021-01-18 MED ORDER — PROPOFOL 10 MG/ML IV BOLUS
INTRAVENOUS | Status: DC | PRN
  Administered 2021-01-18: 20 mg via INTRAVENOUS
  Administered 2021-01-18: 150 mg via INTRAVENOUS

## 2021-01-18 MED ORDER — CHLORHEXIDINE GLUCONATE CLOTH 2 % EX PADS: 6.0000 | MEDICATED_PAD | Freq: Once | CUTANEOUS | Status: DC

## 2021-01-18 MED ORDER — FENTANYL CITRATE (PF) 250 MCG/5ML IJ SOLN
INTRAMUSCULAR | Status: AC
  Filled 2021-01-18: qty 5

## 2021-01-18 MED ORDER — OXYCODONE HCL 5 MG/5ML PO SOLN: 5.0000 mg | Freq: Once | ORAL | Status: DC | PRN

## 2021-01-18 MED ORDER — DEXAMETHASONE SODIUM PHOSPHATE 10 MG/ML IJ SOLN
INTRAMUSCULAR | Status: DC | PRN
  Administered 2021-01-18: 6 mg via INTRAVENOUS

## 2021-01-18 MED ORDER — CHLORHEXIDINE GLUCONATE 0.12 % MT SOLN
15.0000 mL | Freq: Once | OROMUCOSAL | Status: AC
  Administered 2021-01-18: 15 mL via OROMUCOSAL
  Filled 2021-01-18: qty 15

## 2021-01-18 MED ORDER — BUPIVACAINE-EPINEPHRINE (PF) 0.25% -1:200000 IJ SOLN
INTRAMUSCULAR | Status: AC
  Filled 2021-01-18: qty 30

## 2021-01-18 MED ORDER — KETOROLAC TROMETHAMINE 30 MG/ML IJ SOLN: 30.0000 mg | Freq: Once | INTRAMUSCULAR | Status: DC | PRN

## 2021-01-18 MED ORDER — PROPOFOL 10 MG/ML IV BOLUS
INTRAVENOUS | Status: AC
  Filled 2021-01-18: qty 20

## 2021-01-18 MED ORDER — ONDANSETRON HCL 4 MG/2ML IJ SOLN
INTRAMUSCULAR | Status: DC | PRN
  Administered 2021-01-18: 4 mg via INTRAVENOUS

## 2021-01-18 MED ORDER — LIDOCAINE 2% (20 MG/ML) 5 ML SYRINGE
INTRAMUSCULAR | Status: AC
  Filled 2021-01-18: qty 25

## 2021-01-18 MED ORDER — AMISULPRIDE (ANTIEMETIC) 5 MG/2ML IV SOLN: 10.0000 mg | Freq: Once | INTRAVENOUS | Status: DC | PRN

## 2021-01-18 MED ORDER — LACTATED RINGERS IV SOLN: INTRAVENOUS | Status: DC

## 2021-01-18 MED ORDER — CEFAZOLIN SODIUM-DEXTROSE 2-4 GM/100ML-% IV SOLN
2.0000 g | INTRAVENOUS | Status: AC
  Administered 2021-01-18: 2 g via INTRAVENOUS
  Filled 2021-01-18: qty 100

## 2021-01-18 MED ORDER — HYDROCODONE-ACETAMINOPHEN 5-325 MG PO TABS: 1.0000 | ORAL_TABLET | Freq: Four times a day (QID) | ORAL | 0 refills | Status: DC | PRN

## 2021-01-18 MED ORDER — GLYCOPYRROLATE PF 0.2 MG/ML IJ SOSY
PREFILLED_SYRINGE | INTRAMUSCULAR | Status: DC | PRN
  Administered 2021-01-18: .2 mg via INTRAVENOUS

## 2021-01-18 MED ORDER — FENTANYL CITRATE (PF) 100 MCG/2ML IJ SOLN: 25.0000 ug | INTRAMUSCULAR | Status: DC | PRN

## 2021-01-18 MED ORDER — MIDAZOLAM HCL 2 MG/2ML IJ SOLN
INTRAMUSCULAR | Status: AC
  Filled 2021-01-18: qty 2

## 2021-01-18 MED ORDER — LIDOCAINE 2% (20 MG/ML) 5 ML SYRINGE
INTRAMUSCULAR | Status: DC | PRN
  Administered 2021-01-18: 50 mg via INTRAVENOUS

## 2021-01-18 MED ORDER — BUPIVACAINE-EPINEPHRINE 0.25% -1:200000 IJ SOLN
INTRAMUSCULAR | Status: DC | PRN
  Administered 2021-01-18: 20 mL

## 2021-01-18 MED ORDER — ORAL CARE MOUTH RINSE: 15.0000 mL | Freq: Once | OROMUCOSAL | Status: AC

## 2021-01-18 MED ORDER — FENTANYL CITRATE (PF) 100 MCG/2ML IJ SOLN
INTRAMUSCULAR | Status: DC | PRN
  Administered 2021-01-18 (×3): 50 ug via INTRAVENOUS
  Administered 2021-01-18 (×2): 25 ug via INTRAVENOUS

## 2021-01-18 MED ORDER — ACETAMINOPHEN 500 MG PO TABS
1000.0000 mg | ORAL_TABLET | ORAL | Status: AC
  Administered 2021-01-18: 1000 mg via ORAL
  Filled 2021-01-18: qty 2

## 2021-01-18 MED ORDER — MIDAZOLAM HCL 5 MG/5ML IJ SOLN
INTRAMUSCULAR | Status: DC | PRN
  Administered 2021-01-18: 2 mg via INTRAVENOUS

## 2021-01-18 MED ORDER — EPHEDRINE 5 MG/ML INJ
INTRAVENOUS | Status: AC
  Filled 2021-01-18: qty 10

## 2021-01-18 SURGICAL SUPPLY — 34 items
APPLIER CLIP 9.375 MED OPEN (MISCELLANEOUS) ×3
BAG COUNTER SPONGE SURGICOUNT (BAG) IMPLANT
BAG SURGICOUNT SPONGE COUNTING (BAG)
BINDER BREAST LRG (GAUZE/BANDAGES/DRESSINGS) ×3 IMPLANT
BINDER BREAST XLRG (GAUZE/BANDAGES/DRESSINGS) IMPLANT
CANISTER SUCT 3000ML PPV (MISCELLANEOUS) ×3 IMPLANT
CHLORAPREP W/TINT 26 (MISCELLANEOUS) ×3 IMPLANT
CLIP APPLIE 9.375 MED OPEN (MISCELLANEOUS) ×1 IMPLANT
COVER PROBE W GEL 5X96 (DRAPES) ×3 IMPLANT
COVER SURGICAL LIGHT HANDLE (MISCELLANEOUS) ×3 IMPLANT
DERMABOND ADVANCED (GAUZE/BANDAGES/DRESSINGS) ×2
DERMABOND ADVANCED .7 DNX12 (GAUZE/BANDAGES/DRESSINGS) ×1 IMPLANT
DEVICE DUBIN SPECIMEN MAMMOGRA (MISCELLANEOUS) ×3 IMPLANT
DRAPE CHEST BREAST 15X10 FENES (DRAPES) ×3 IMPLANT
ELECT COATED BLADE 2.86 ST (ELECTRODE) ×3 IMPLANT
ELECT REM PT RETURN 9FT ADLT (ELECTROSURGICAL) ×3
ELECTRODE REM PT RTRN 9FT ADLT (ELECTROSURGICAL) ×1 IMPLANT
GLOVE SURG ENC MOIS LTX SZ7.5 (GLOVE) ×3 IMPLANT
GLOVE SURG UNDER POLY LF SZ6.5 (GLOVE) ×3 IMPLANT
GLOVE SURG UNDER POLY LF SZ7 (GLOVE) ×3 IMPLANT
GOWN STRL REUS W/ TWL LRG LVL3 (GOWN DISPOSABLE) ×2 IMPLANT
GOWN STRL REUS W/TWL LRG LVL3 (GOWN DISPOSABLE) ×4
KIT BASIN OR (CUSTOM PROCEDURE TRAY) ×3 IMPLANT
KIT MARKER MARGIN INK (KITS) ×3 IMPLANT
LIGHT WAVEGUIDE WIDE FLAT (MISCELLANEOUS) IMPLANT
NEEDLE HYPO 25GX1X1/2 BEV (NEEDLE) ×3 IMPLANT
NS IRRIG 1000ML POUR BTL (IV SOLUTION) ×3 IMPLANT
PACK GENERAL/GYN (CUSTOM PROCEDURE TRAY) ×3 IMPLANT
SUT MNCRL AB 4-0 PS2 18 (SUTURE) ×3 IMPLANT
SUT SILK 2 0 SH (SUTURE) IMPLANT
SUT VIC AB 3-0 SH 18 (SUTURE) ×3 IMPLANT
SYR CONTROL 10ML LL (SYRINGE) ×3 IMPLANT
TOWEL GREEN STERILE (TOWEL DISPOSABLE) ×3 IMPLANT
TOWEL GREEN STERILE FF (TOWEL DISPOSABLE) ×3 IMPLANT

## 2021-01-18 NOTE — Interval H&P Note (Signed)
History and Physical Interval Note:  01/18/2021 10:55 AM  Ann Buckley  has presented today for surgery, with the diagnosis of RIGHT BREAST DCIS.  The various methods of treatment have been discussed with the patient and family. After consideration of risks, benefits and other options for treatment, the patient has consented to  Procedure(s): RIGHT BREAST LUMPECTOMY WITH RADIOACTIVE SEED LOCALIZATION X 2 (Right) as a surgical intervention.  The patient's history has been reviewed, patient examined, no change in status, stable for surgery.  I have reviewed the patient's chart and labs.  Questions were answered to the patient's satisfaction.     [name]

## 2021-01-18 NOTE — Anesthesia Procedure Notes (Signed)
Procedure Name: LMA Insertion Date/Time: 01/18/2021 11:32 AM Performed by: Ezequiel Kayser, CRNA Pre-anesthesia Checklist: Patient identified, Emergency Drugs available, Suction available and Patient being monitored Patient Re-evaluated:Patient Re-evaluated prior to induction Oxygen Delivery Method: Circle System Utilized Preoxygenation: Pre-oxygenation with 100% oxygen Induction Type: IV induction Ventilation: Mask ventilation without difficulty LMA: LMA inserted LMA Size: 3.0 Number of attempts: 1 Airway Equipment and Method: Bite block Placement Confirmation: positive ETCO2 Tube secured with: Tape Dental Injury: Teeth and Oropharynx as per pre-operative assessment

## 2021-01-18 NOTE — Transfer of Care (Signed)
Immediate Anesthesia Transfer of Care Note  Patient: Ann Buckley  Procedure(s) Performed: RIGHT BREAST LUMPECTOMY WITH RADIOACTIVE SEED LOCALIZATION X 2 (Right: Breast)  Patient Location: PACU  Anesthesia Type:General  Level of Consciousness: awake and patient cooperative  Airway & Oxygen Therapy: Patient Spontanous Breathing and Patient connected to face mask oxygen  Post-op Assessment: Report given to RN, Post -op Vital signs reviewed and stable and Patient moving all extremities X 4  Post vital signs: Reviewed and stable  Last Vitals:  Vitals Value Taken Time  BP 112/68 01/18/21 1243  Temp    Pulse 66 01/18/21 1245  Resp 18 01/18/21 1245  SpO2 99 % 01/18/21 1245  Vitals shown include unvalidated device data.  Last Pain:  Vitals:   01/18/21 0919  TempSrc:   PainSc: 0-No pain         Complications: No notable events documented.

## 2021-01-18 NOTE — Op Note (Signed)
01/18/2021  12:33 PM  PATIENT:  Ann Buckley  40 y.o. female  PRE-OPERATIVE DIAGNOSIS:  RIGHT BREAST DCIS  POST-OPERATIVE DIAGNOSIS:  RIGHT BREAST DCIS  PROCEDURE:  Procedure(s): RIGHT BREAST LUMPECTOMY WITH RADIOACTIVE SEED LOCALIZATION X 2 (Right)  SURGEON:  Surgeon(s) and Role:    * Jovita Kussmaul, MD - Primary  PHYSICIAN ASSISTANT:   ASSISTANTS: none   ANESTHESIA:   local and general  EBL:  5 mL   BLOOD ADMINISTERED:none  DRAINS: none   LOCAL MEDICATIONS USED:  MARCAINE     SPECIMEN:  Source of Specimen:  right breast tissue  DISPOSITION OF SPECIMEN:  PATHOLOGY  COUNTS:  YES  TOURNIQUET:  * No tourniquets in log *  DICTATION: .Dragon Dictation  After informed consent was obtained the patient was brought to the operating room and placed in the supine position on the operating table.  After adequate induction of general anesthesia the patient's right breast was prepped with ChloraPrep, allowed to dry, and draped in usual sterile manner.  An appropriate timeout was performed.  Previously 2 I-125 seeds were placed in the upper outer aspect of the right breast to bracket an area of ductal carcinoma in situ.  The neoprobe was set to I-125 in the area of radioactivity was readily identified.  The area around this was infiltrated with quarter percent Marcaine.  A curvilinear incision was made along the outer edge of the areola with a 15 blade knife.  The incision was carried through the skin and subcutaneous tissue sharply with the electrocautery.  Dissection was then carried throughout the outer and upper aspect of the right breast between the breast tissue and the skin and subcutaneous fat until we were well beyond the area of the cancer.  I then removed a circular portion of breast tissue sharply with the electrocautery around the radioactive seed while checking the area of radioactivity frequently.  This dissection was carried all the way to the chest wall.  Once  the specimen was removed it was oriented with the appropriate paint colors.  A specimen radiograph was obtained that showed the 2 seeds and all 4 clips to be near the center of the specimen.  The specimen was then sent to pathology for further evaluation.  Hemostasis was achieved using the Bovie electrocautery.  The cavity was marked with clips.  Wound was irrigated with saline and infiltrated with more quarter percent Marcaine.  The deep layer of the wound was then closed with layers of interrupted 3-0 Vicryl stitches.  The skin was then closed with interrupted 4-0 Monocryl subcuticular stitches.  Dermabond dressings were applied.  The patient tolerated the procedure well.  At the end of the case all needle sponge and instrument counts were correct.  The patient was then awakened and taken to recovery in stable condition.  PLAN OF CARE: Discharge to home after PACU  PATIENT DISPOSITION:  PACU - hemodynamically stable.   Delay start of Pharmacological VTE agent (>24hrs) due to surgical blood loss or risk of bleeding: not applicable

## 2021-01-18 NOTE — H&P (Signed)
Ann Buckley  Location: [facility] Patient #: [phone] DOB: 1981-01-28 Married / Language: English / Race: White Female   History of Present Illness  The patient is a 40 year old female who presents for a follow-up for Breast cancer. The patient is a 40 year old white female who has a known 2.8 cm area of DCIS in the upper outer quadrant of the right breast.  Her recent MRI showed a second small area in the right breast centrally measuring 7 mm as well as a 8 mm area in the upper inner quadrant of the left breast.  These 2 areas are scheduled to be biopsied on Monday.  Her DCIS is ER and PR positive.  She returns today with more questions.  She is starting to think about mastectomy.   Past Surgical History  Breast Biopsy   Right. multiple  Diagnostic Studies History  Colonoscopy   never Mammogram   within last year Pap Smear   1-5 years ago  Allergies  Penicillins   Rash. Sulfamethoxazole-Trimethoprim *Anti-infective Agents - Misc.**   Rash. Allergies Reconciled    Medication History  Ibuprofen  (800MG  Tablet, Oral) Active. Ketoconazole  (2% Shampoo, External) Active. valACYclovir HCl  (1GM Tablet, Oral) Active. Xanax  (0.5MG  Tablet, Oral) Active. Biotin  (1MG  Capsule, Oral) Active. Mirena (52 MG)  (20MCG/24HR IUD, Intrauterine) Active. Multiple Vitamin  (1 (one) Oral) Active. Probiotic  (Oral) Active. Medications Reconciled   Social History  Alcohol use   Moderate alcohol use. Caffeine use   Coffee. Illicit drug use   Uses socially only. Tobacco use   Never smoker.  Family History  Breast Cancer   Family Members In General. Diabetes Mellitus   Family Members In General, Mother. Heart Disease   Family Members In General. Hypertension   Father.  Pregnancy / Birth History  Age at menarche   27 years. Contraceptive History   Intrauterine device. Gravida   0 Para   0 Regular periods    Other Problems  Breast Cancer   Lump In Breast       Review of Systems  General Not Present- Appetite Loss, Chills, Fatigue, Fever, Night Sweats, Weight Gain and Weight Loss. Skin Present- Rash. Not Present- Change in Wart/Mole, Dryness, Hives, Jaundice, New Lesions, Non-Healing Wounds and Ulcer. HEENT Not Present- Earache, Hearing Loss, Hoarseness, Nose Bleed, Oral Ulcers, Ringing in the Ears, Seasonal Allergies, Sinus Pain, Sore Throat, Visual Disturbances, Wears glasses/contact lenses and Yellow Eyes. Respiratory Not Present- Bloody sputum, Chronic Cough, Difficulty Breathing, Snoring and Wheezing. Breast Present- Breast Mass. Not Present- Breast Pain, Nipple Discharge and Skin Changes. Cardiovascular Not Present- Chest Pain, Difficulty Breathing Lying Down, Leg Cramps, Palpitations, Rapid Heart Rate, Shortness of Breath and Swelling of Extremities. Female Genitourinary Not Present- Frequency, Nocturia, Painful Urination, Pelvic Pain and Urgency. Musculoskeletal Not Present- Back Pain, Joint Pain, Joint Stiffness, Muscle Pain, Muscle Weakness and Swelling of Extremities. Neurological Not Present- Decreased Memory, Fainting, Headaches, Numbness, Seizures, Tingling, Tremor, Trouble walking and Weakness. Psychiatric Present- Anxiety. Not Present- Bipolar, Change in Sleep Pattern, Depression, Fearful and Frequent crying. Endocrine Not Present- Cold Intolerance, Excessive Hunger, Hair Changes, Heat Intolerance, Hot flashes and New Diabetes. Hematology Not Present- Blood Thinners, Easy Bruising, Excessive bleeding, Gland problems, HIV and Persistent Infections.  Vitals  Weight: 147.38 lb   Height: 64 in  Body Surface Area: 1.72 m   Body Mass Index: 25.3 kg/m   Temp.: 97.9 F    Pulse: 93 (Regular)    P.OX: 99% (Room  air) BP: 126/76(Sitting, Left Arm, Standard)       Physical Exam  General Mental Status - Alert. General Appearance - Consistent with stated age. Hydration - Well hydrated. Voice - Normal.  Head and Neck Head -  normocephalic, atraumatic with no lesions or palpable masses. Trachea - midline. Thyroid Gland Characteristics - normal size and consistency.  Eye Eyeball - Bilateral - Extraocular movements intact. Sclera/Conjunctiva - Bilateral - No scleral icterus.  Chest and Lung Exam Chest and lung exam reveals  - quiet, even and easy respiratory effort with no use of accessory muscles and on auscultation, normal breath sounds, no adventitious sounds and normal vocal resonance. Inspection Chest Wall - Normal. Back - normal.  Breast Note:  There is a small palpable bruise in the upper outer central right breast. There is no other palpable mass in either breast. there is no palpable axillary, supraclavicular, or cervical lymphadenopathy   Cardiovascular Cardiovascular examination reveals  - normal heart sounds, regular rate and rhythm with no murmurs and normal pedal pulses bilaterally.  Abdomen Inspection Inspection of the abdomen reveals - No Hernias. Skin - Scar - no surgical scars. Palpation/Percussion Palpation and Percussion of the abdomen reveal - Soft, Non Tender, No Rebound tenderness, No Rigidity (guarding) and No hepatosplenomegaly. Auscultation Auscultation of the abdomen reveals - Bowel sounds normal.  Neurologic Neurologic evaluation reveals  - alert and oriented x 3 with no impairment of recent or remote memory. Mental Status - Normal.  Musculoskeletal Normal Exam - Left - Upper Extremity Strength Normal and Lower Extremity Strength Normal. Normal Exam - Right - Upper Extremity Strength Normal and Lower Extremity Strength Normal.  Lymphatic Head & Neck  General Head & Neck Lymphatics: Bilateral - Description - Normal. Axillary  General Axillary Region: Bilateral - Description - Normal. Tenderness - Non Tender. Femoral & Inguinal  Generalized Femoral & Inguinal Lymphatics: Bilateral - Description - Normal. Tenderness - Non Tender.    Assessment & Plan  DUCTAL  CARCINOMA IN SITU (DCIS) OF RIGHT BREAST (D05.11) Impression: The patient has a known 2.8 cm area of DCIS in the upper outer quadrant of the right breast. Her recent MRI showed another small area centrally in the right breast and a small area in the upper inner quadrant of the left breast. Biopsies of these 2 areas are scheduled for Monday. She is beginning to think about mastectomy so we will refer her to plastic surgery to get that information. We will call her with the results of the biopsies next week and then finalize our plans for surgery. This patient encounter took 30 minutes today to perform the following: take history, perform exam, review outside records, interpret imaging, counsel the patient on their diagnosis and document encounter, findings & plan in the EHR Current Plans Referred to Surgery - Plastic, for evaluation and follow up (Plastic Surgery). Routine.

## 2021-01-18 NOTE — Anesthesia Preprocedure Evaluation (Addendum)
Anesthesia Evaluation  Patient identified by MRN, date of birth, ID band Patient awake    Reviewed: Allergy & Precautions, NPO status , Patient's Chart, lab work & pertinent test results  Airway Mallampati: I  TM Distance: >3 FB Neck ROM: Full    Dental no notable dental hx.    Pulmonary neg pulmonary ROS,    Pulmonary exam normal breath sounds clear to auscultation       Cardiovascular negative cardio ROS Normal cardiovascular exam Rhythm:Regular Rate:Normal     Neuro/Psych Anxiety negative neurological ROS     GI/Hepatic negative GI ROS, (+)     substance abuse  marijuana use,   Endo/Other  PCOS (polycystic ovarian syndrome)  Renal/GU negative Renal ROS     Musculoskeletal negative musculoskeletal ROS (+)   Abdominal   Peds  Hematology negative hematology ROS (+)   Anesthesia Other Findings RIGHT BREAST DCIS  Reproductive/Obstetrics hcg negative                            Anesthesia Physical Anesthesia Plan  ASA: 2  Anesthesia Plan: General   Post-op Pain Management:    Induction: Intravenous  PONV Risk Score and Plan: 3 and Ondansetron, Dexamethasone, Midazolam and Treatment may vary due to age or medical condition  Airway Management Planned: LMA  Additional Equipment:   Intra-op Plan:   Post-operative Plan: Extubation in OR  Informed Consent: I have reviewed the patients History and Physical, chart, labs and discussed the procedure including the risks, benefits and alternatives for the proposed anesthesia with the patient or authorized representative who has indicated his/her understanding and acceptance.     Dental advisory given  Plan Discussed with: CRNA  Anesthesia Plan Comments:        Anesthesia Quick Evaluation

## 2021-01-19 ENCOUNTER — Encounter (HOSPITAL_COMMUNITY): Payer: Self-pay | Admitting: General Surgery

## 2021-01-19 NOTE — Anesthesia Postprocedure Evaluation (Signed)
Anesthesia Post Note  Patient: Ann Buckley  Procedure(s) Performed: RIGHT BREAST LUMPECTOMY WITH RADIOACTIVE SEED LOCALIZATION X 2 (Right: Breast)     Patient location during evaluation: PACU Anesthesia Type: General Level of consciousness: awake Pain management: pain level controlled Vital Signs Assessment: post-procedure vital signs reviewed and stable Respiratory status: spontaneous breathing, nonlabored ventilation, respiratory function stable and patient connected to nasal cannula oxygen Cardiovascular status: blood pressure returned to baseline and stable Postop Assessment: no apparent nausea or vomiting Anesthetic complications: no   No notable events documented.  Last Vitals:  Vitals:   01/18/21 1300 01/18/21 1315  BP: 113/73 106/74  Pulse: (!) 58 (!) 57  Resp: 19 15  Temp:  (!) 36.3 C  SpO2: 94% 96%    Last Pain:  Vitals:   01/18/21 1315  TempSrc:   PainSc: 4                  Jacobb Alen P Sarthak Rubenstein

## 2021-01-24 ENCOUNTER — Encounter: Payer: Self-pay | Admitting: General Practice

## 2021-01-24 ENCOUNTER — Encounter: Payer: Self-pay | Admitting: *Deleted

## 2021-01-24 ENCOUNTER — Telehealth: Payer: Self-pay | Admitting: Radiation Oncology

## 2021-01-24 DIAGNOSIS — D0511 Intraductal carcinoma in situ of right breast: Secondary | ICD-10-CM

## 2021-01-24 NOTE — Progress Notes (Signed)
[facility] Spiritual Care Note  Followed up with Ann Buckley by phone as planned. She reports that her pain is well managed and that her next steps in supporting her own healing are to get paired with an Pharmacist, hospital and to attend virtual Breast Cancer Support Group.  Ann Buckley is aware of ongoing chaplain availability, including when she is on campus for daily radiation treatments, and plans to reach out as needed/desired.   [place], [place], [facility] Pager [phone] Voicemail [phone]

## 2021-01-26 ENCOUNTER — Encounter: Payer: Self-pay | Admitting: *Deleted

## 2021-01-26 LAB — SURGICAL PATHOLOGY

## 2021-01-29 NOTE — Progress Notes (Signed)
Patient Care Team: Merrilee Seashore, MD as PCP - General (Internal Medicine) Mauro Kaufmann, RN as Oncology Nurse Navigator Rockwell Germany, RN as Oncology Nurse Navigator Jovita Kussmaul, MD as Consulting Physician (General Surgery) Nicholas Lose, MD as Consulting Physician (Hematology and Oncology) Kyung Rudd, MD as Consulting Physician (Radiation Oncology)  DIAGNOSIS:    ICD-10-CM   1. Ductal carcinoma in situ (DCIS) of right breast  D05.11       SUMMARY OF ONCOLOGIC HISTORY: Oncology History  Ductal carcinoma in situ (DCIS) of right breast  12/09/2020 Initial Diagnosis   Screening mammogram showed right breast calcifications. Diagnostic mammogram and US showed 3 groups of calcifications in the right breast, largest 1.4cm, with two smaller measuring 0.1cm. 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 Testing was completed through the BRCAplus and CancerNext-Expanded + RNAinsight panels offered by [facility] laboratories. The report date is 12/28/2020.   The BRCAplus panel offered by [facility] and includes sequencing and deletion/duplication analysis for the following 8 genes: ATM, BRCA1, BRCA2, CDH1, CHEK2, PALB2, PTEN, and TP53. The CancerNext-Expanded + RNAinsight gene panel offered by [facility] and includes sequencing and rearrangement analysis for the following 77 genes: AIP, ALK, APC, ATM, AXIN2, BAP1, BARD1, BLM, BMPR1A, BRCA1, BRCA2, BRIP1, CDC73, CDH1, CDK4, CDKN1B, CDKN2A, CHEK2, CTNNA1, DICER1, FANCC, FH,  FLCN, GALNT12, KIF1B, LZTR1, MAX, MEN1, MET, MLH1, MSH2, MSH3, MSH6, MUTYH, NBN, NF1, NF2, NTHL1, PALB2, PHOX2B, PMS2, POT1, PRKAR1A, PTCH1, PTEN, RAD51C, RAD51D, RB1, RECQL, RET, SDHA, SDHAF2, SDHB, SDHC, SDHD, SMAD4, SMARCA4, SMARCB1, SMARCE1, STK11, SUFU, TMEM127, TP53, TSC1, TSC2, VHL and XRCC2 (sequencing and deletion/duplication); EGFR, EGLN1, HOXB13, KIT, MITF, PDGFRA, POLD1 and POLE (sequencing only); EPCAM and GREM1 (deletion/duplication only). RNA data is routinely analyzed for use in variant interpretation for all genes.   01/18/2021 Surgery   Right lumpectomy: Incidental focus of IDC grade 1, 0.2 cm, foci of DCIS intermediate grade with focal necrosis, margins negative, ER 60%, PR 80% T1 a N0 stage Ia     CHIEF COMPLIANT: Follow-up of right breast DCIS  INTERVAL HISTORY: Ann Buckley is a 40 y.o. with above-mentioned history of DCIS of the right breast. She recently underwent a right lumpectomy on 01/18/21 with Dr. Marlou Starks for which the pathology showed incidental focus of invasive ductal carcinoma, grade 1, 0.2 cm, foci of ductal carcinoma in situ, intermediate grade, with focal necrosis and calcification, and resection margins are negative for carcinoma; closest is the anterior margin at 2.5 mm. She reports to the clinic today for follow-up.  Her major complaint today is a diffuse rash that appears to be erythematous and associated with severe itching.  This all started soon after surgery along the surgical tape and then also lead to spreading of the rash to different parts of her body including her arms abdominal wall beneath the breasts etc.  Because of the severity of her symptoms she was prescribed hydroxyzine as well as provided a prescription for betamethasone ointment.  She has used it sparingly and has not found any major benefit.  She  has not tried it 3 times a day as was recommended.  The itching is quite severe.  ALLERGIES:  is allergic to penicillins and  sulfamethoxazole-trimethoprim.  MEDICATIONS:  Current Outpatient Medications  Medication Sig Dispense Refill   loratadine (CLARITIN) 10 MG tablet Take 10 mg by mouth daily.     ALPRAZolam (XANAX) 0.5 MG tablet Take 0.5 mg by mouth 3 (three) times daily as needed for anxiety.     clotrimazole-betamethasone (LOTRISONE) cream Apply 1 application topically 2 (two) times daily.     fluticasone (FLONASE) 50 MCG/ACT nasal spray Place 2 sprays into both nostrils daily as needed for allergies or rhinitis.     HYDROcodone-acetaminophen (NORCO/VICODIN) 5-325 MG tablet Take 1-2 tablets by mouth every 6 (six) hours as needed for moderate pain or severe pain. 15 tablet 0   hydrOXYzine (ATARAX/VISTARIL) 25 MG tablet Take 25 mg by mouth 2 (two) times daily.     ibuprofen (ADVIL,MOTRIN) 800 MG tablet Take 1 tablet (800 mg total) by mouth every 8 (eight) hours as needed. 30 tablet 1   No current facility-administered medications for this visit.    PHYSICAL EXAMINATION: ECOG PERFORMANCE STATUS: 1 - Symptomatic but completely ambulatory  Vitals:   01/30/21 1113  Pulse: 82  Resp: 17  Temp: 97.7 F (36.5 C)  SpO2: 100%   Filed Weights   01/30/21 1113  Weight: 152 lb 9.6 oz (69.2 kg)      LABORATORY DATA:  I have reviewed the data as listed CMP Latest Ref Rng & Units 01/11/2021 12/14/2020  Glucose 70 - 99 mg/dL 84 130(H)  BUN 6 - 20 mg/dL 9 10  Creatinine 0.44 - 1.00 mg/dL 0.77 0.76  Sodium 135 - 145 mmol/L 136 140  Potassium 3.5 - 5.1 mmol/L 4.1 3.9  Chloride 98 - 111 mmol/L 105 106  CO2 22 - 32 mmol/L 24 26  Calcium 8.9 - 10.3 mg/dL 9.6 9.5  Total Protein 6.5 - 8.1 g/dL 7.6 7.4  Total Bilirubin 0.3 - 1.2 mg/dL 1.1 0.6  Alkaline Phos 38 - 126 U/L 32(L) 36(L)  AST 15 - 41 U/L 19 16  ALT 0 - 44 U/L 15 10    Lab Results  Component Value Date   WBC 5.3 01/11/2021   HGB 14.1 01/11/2021   HCT 41.4 01/11/2021   MCV 97.6 01/11/2021   PLT 322 01/11/2021   NEUTROABS 4.5 12/14/2020     ASSESSMENT & PLAN:  Ductal carcinoma in situ (DCIS) of right breast 12/09/2020:Screening mammogram showed right breast calcifications. Diagnostic mammogram and US showed 3 groups of calcifications in the right breast, largest 1.4cm, with two smaller measuring 0.1cm. 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  Treatment plan: 1. Breast conserving surgery Right lumpectomy: Incidental focus of IDC grade 1, 0.2 cm, foci of DCIS intermediate grade with focal necrosis, margins negative, ER 60%, PR 80%, Ki-67 2%, HER2 negative T1 a N0 stage Ia 2. sentinel lymph node study 3. Followed by adjuvant radiation therapy 4. Followed by antiestrogen therapy with tamoxifen 5 years  Pathology counseling: I discussed the final pathology report of the patient provided  a copy of this report. I discussed the margins.  We also discussed the final staging along with previously performed ER/PR testing as well as the recently performed HER2 testing.  MammaPrint testing would be indicated only if she had lymph node positive disease.  I do not recommend Oncotype DX testing at this time. I tried to reassure her that given the size  of the tumor and the grade 1 and low Ki-67 and negative MRI, the likelihood of involved lymph node is extremely low. If she is lymph node positive then we will send for MammaPrint.  Return to clinic based upon lymph node surgery results. I called Dr. Marlou Starks who will schedule her for the sentinel lymph node study.  Rash: It appears to be dermatitis as a result of either contact dermatitis or due to some form of systemic treatment whether it is the pain medicine that she took initially or something else.  It is causing her significant discomfort and itching.  She will increase the hydroxyzine to 3 times a day along with topical betamethasone ointment 3 times a day.  If it does not get better then we will have to give her Medrol Dosepak.  She will call us later this week to inform  us of her symptoms.  No orders of the defined types were placed in this encounter.  The patient has a good understanding of the overall plan. she agrees with it. she will call with any problems that may develop before the next visit here.  Total time spent: 20 mins including face to face time and time spent for planning, charting and coordination of care  Rulon Eisenmenger, MD, MPH 01/30/2021  I, Thana Ates, am acting as scribe for Dr. Nicholas Lose.  I have reviewed the above documentation for accuracy and completeness, and I agree with the above.

## 2021-01-30 ENCOUNTER — Inpatient Hospital Stay: Payer: 59 | Attending: Hematology and Oncology | Admitting: Hematology and Oncology

## 2021-01-30 ENCOUNTER — Other Ambulatory Visit: Payer: Self-pay

## 2021-01-30 DIAGNOSIS — Z1501 Genetic susceptibility to malignant neoplasm of breast: Secondary | ICD-10-CM | POA: Diagnosis not present

## 2021-01-30 DIAGNOSIS — R21 Rash and other nonspecific skin eruption: Secondary | ICD-10-CM | POA: Insufficient documentation

## 2021-01-30 DIAGNOSIS — D0511 Intraductal carcinoma in situ of right breast: Secondary | ICD-10-CM | POA: Diagnosis present

## 2021-01-30 DIAGNOSIS — Z170 Estrogen receptor positive status [ER+]: Secondary | ICD-10-CM | POA: Insufficient documentation

## 2021-01-30 DIAGNOSIS — Z79810 Long term (current) use of selective estrogen receptor modulators (SERMs): Secondary | ICD-10-CM | POA: Insufficient documentation

## 2021-01-30 DIAGNOSIS — Z923 Personal history of irradiation: Secondary | ICD-10-CM | POA: Insufficient documentation

## 2021-01-30 NOTE — Assessment & Plan Note (Signed)
12/09/2020:Screening mammogram showed right breast calcifications. Diagnostic mammogram and US showed 3 groups of calcifications in the right breast, largest 1.4cm, with two smaller measuring 0.1cm. 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  Treatment plan: 1. Breast conserving surgery Right lumpectomy: Incidental focus of IDC grade 1, 0.2 cm, foci of DCIS intermediate grade with focal necrosis, margins negative, ER 60%, PR 80%, Ki-67 2%, HER2 negative T1 a N0 stage Ia 2. Followed by adjuvant radiation therapy 3. Followed by antiestrogen therapy with tamoxifen 5 years  Pathology counseling: I discussed the final pathology report of the patient provided  a copy of this report. I discussed the margins.  We also discussed the final staging along with previously performed ER/PR testing as well as the recently performed HER2 testing.  Return to clinic after radiation is complete

## 2021-01-31 ENCOUNTER — Ambulatory Visit: Payer: Self-pay | Admitting: General Surgery

## 2021-01-31 DIAGNOSIS — C50411 Malignant neoplasm of upper-outer quadrant of right female breast: Secondary | ICD-10-CM

## 2021-01-31 DIAGNOSIS — Z170 Estrogen receptor positive status [ER+]: Secondary | ICD-10-CM

## 2021-02-06 ENCOUNTER — Encounter: Payer: Self-pay | Admitting: *Deleted

## 2021-02-08 ENCOUNTER — Ambulatory Visit: Payer: 59

## 2021-02-08 ENCOUNTER — Ambulatory Visit: Admission: RE | Admit: 2021-02-08 | Payer: 59 | Source: Ambulatory Visit | Admitting: Radiation Oncology

## 2021-02-08 ENCOUNTER — Ambulatory Visit: Payer: 59 | Admitting: Radiation Oncology

## 2021-02-14 ENCOUNTER — Other Ambulatory Visit: Payer: Self-pay

## 2021-02-14 ENCOUNTER — Encounter (HOSPITAL_BASED_OUTPATIENT_CLINIC_OR_DEPARTMENT_OTHER): Payer: Self-pay | Admitting: General Surgery

## 2021-02-15 ENCOUNTER — Ambulatory Visit: Payer: 59 | Attending: General Surgery

## 2021-02-15 DIAGNOSIS — R293 Abnormal posture: Secondary | ICD-10-CM | POA: Diagnosis present

## 2021-02-15 DIAGNOSIS — D0511 Intraductal carcinoma in situ of right breast: Secondary | ICD-10-CM | POA: Insufficient documentation

## 2021-02-15 NOTE — Patient Instructions (Signed)
Physical Therapy Information for After Breast Cancer Surgery/Treatment:  Lymphedema is a swelling condition that you may be at risk for in your arm if you have lymph nodes removed from the armpit area.  After a sentinel node biopsy, the risk is approximately 5-9% and is higher after an axillary node dissection.  There is treatment available for this condition and it is not life-threatening.  Contact your physician or physical therapist with concerns. You may begin the 4 shoulder/posture exercises (see additional sheet) when permitted by your physician (typically a week after surgery).  If you have drains, you may need to wait until those are removed before beginning range of motion exercises.  A general recommendation is to not lift your arms above shoulder height until drains are removed.  These exercises should be done to your tolerance and gently.  This is not a "no pain/no gain" type of recovery so listen to your body and stretch into the range of motion that you can tolerate, stopping if you have pain.  If you are having immediate reconstruction, ask your plastic surgeon about doing exercises as he or she may want you to wait. We encourage you to attend the free one time ABC (After Breast Cancer) class offered by [facility].  You will learn information related to lymphedema risk, prevention and treatment and additional exercises to regain mobility following surgery.  You can call [phone] for more information.  This is offered the 1st and 3rd Monday of each month.  You only attend the class one time. While undergoing any medical procedure or treatment, try to avoid blood pressure being taken or needle sticks from occurring on the arm on the side of cancer.   This recommendation begins after surgery and continues for the rest of your life.  This may help reduce your risk of getting lymphedema (swelling in your arm). An excellent resource for those seeking information on  lymphedema is the National Lymphedema Network's web site. It can be accessed at [place].org If you notice swelling in your hand, arm or breast at any time following surgery (even if it is many years from now), please contact your doctor or physical therapist to discuss this.  Lymphedema can be treated at any time but it is easier for you if it is treated early on.  If you feel like your shoulder motion is not returning to normal in a reasonable amount of time, please contact your surgeon or physical therapist.  Ann [facility], [place], [place] [phone]; [address] [place], [place] [address] ABC CLASS After Breast Cancer Class  After Breast Cancer Class is a specially designed exercise class to assist you in a safe recover after having breast cancer surgery.  In this class you will learn how to get back to full function whether your drains were just removed or if you had surgery a month ago.  This one-time class is held the 1st and 3rd Monday of every month from 11:00 a.m. until 12:00 noon   is a virtual class  This class is FREE and space is limited. For more information or to register for the next available class, call [phone].  Class Goals  Understand specific stretches to improve the flexibility of you chest and shoulder. Learn ways to safely strengthen your upper body and improve your posture. Understand the warning signs of infection and why you may be at risk for an arm infection. Learn about Lymphedema and prevention.  ** You do  not attend this class until after surgery.  Drains must be removed to participate  Patient was instructed today in a home exercise program today for post op shoulder range of motion. These included active assist shoulder flexion in sitting, scapular retraction, wall walking with shoulder abduction, and hands behind head external rotation.  She was encouraged to do these twice a day, holding 3 seconds and repeating 5 times when permitted by her  physician.

## 2021-02-15 NOTE — Progress Notes (Signed)

## 2021-02-15 NOTE — Therapy (Signed)
[place], [place], [address] Phone: [phone]   Fax:  [phone]  Physical Therapy Evaluation  Patient Details  Name: Ann Buckley MRN: [id number] Date of Birth: 12/26/1980 Referring Provider (PT): Dr. Marlou Starks   Encounter Date: 02/15/2021   PT End of Session - 02/15/21 1231     Visit Number 1    Number of Visits 2    Date for PT Re-Evaluation 03/29/21    PT Start Time 1059    PT Stop Time 1200    PT Time Calculation (min) 61 min    Activity Tolerance Patient tolerated treatment well    Behavior During Therapy [facility] for tasks assessed/performed             Past Medical History:  Diagnosis Date   Abnormal Pap smear of cervix 2001   Anxiety    Family history of breast cancer    PCOS (polycystic ovarian syndrome)    STD (sexually transmitted disease)    hsv1    Past Surgical History:  Procedure Laterality Date   BREAST LUMPECTOMY WITH RADIOACTIVE SEED LOCALIZATION Right 01/18/2021   Procedure: RIGHT BREAST LUMPECTOMY WITH RADIOACTIVE SEED LOCALIZATION X 2;  Surgeon: Jovita Kussmaul, MD;  Location: [facility];  Service: General;  Laterality: Right;   CERVICAL BIOPSY  W/ LOOP ELECTRODE EXCISION  Age 7 2001   Paps have been normal eversince    COLPOSCOPY     INTRAUTERINE DEVICE (IUD) INSERTION  07/2017   Mirena     There were no vitals filed for this visit.    Subjective Assessment - 02/15/21 1058     Subjective Pt had initial lumpectomy on 01/11/2021.  She is pending a SLNB 02/21/2021.  Pt reports no limitations with shoulder ROM presently. She wants to be proactive and expresses concern about lymphedema    Pertinent History Screening mammogram showed right breast calcifications. Diagnostic mammogram and US showed 3 groups of calcifications in the right breast, largest 1.4cm, with two smaller measuring 0.1cm. 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  She had a right lumpectomy performed on 12/29/20 and is pending a SLNB on 02/21/2021.  She also had a positive genetic test.She will have adjuvant radiation and anti estrogens    Patient Stated Goals pre-SLNB screen, gain information from provider    Currently in Pain? No/denies    Multiple Pain Sites No                [facility] PT Assessment - 02/15/21 0001       Assessment   Medical Diagnosis Right breast Cancer    Referring Provider (PT) Dr. Marlou Starks    Onset Date/Surgical Date 12/29/20    Hand Dominance Right    Prior Therapy yes      Precautions   Precaution Comments lymphedema risk      Restrictions   Weight Bearing Restrictions No      Balance Screen   Has the patient fallen in the past 6 months No    Has the patient had a decrease in activity level because of a fear of falling?  No    Is the patient reluctant to leave their home because of a fear of falling?  No      Home Environment   Living Environment Private residence    Living Arrangements Spouse/significant other;Children    Available Help at Discharge Family      Prior Function  Level of Independence Independent    Vocation Part time employment    Vocation Requirements HR at [employer]      Cognition   Overall Cognitive Status Within Functional Limits for tasks assessed      Observation/Other Assessments   Observations right breast incision healed with minor scr tissue underneath    Skin Integrity WNL      Posture/Postural Control   Posture/Postural Control No significant limitations      AROM   Right Shoulder Extension 51 Degrees    Right Shoulder Flexion 167 Degrees    Right Shoulder ABduction 180 Degrees    Right Shoulder Internal Rotation 65 Degrees    Right Shoulder External Rotation 108 Degrees    Left Shoulder Extension 45 Degrees    Left Shoulder Flexion 170 Degrees    Left Shoulder ABduction 180 Degrees    Left Shoulder Internal Rotation 67 Degrees    Left  Shoulder External Rotation 107 Degrees               LYMPHEDEMA/ONCOLOGY QUESTIONNAIRE - 02/15/21 0001       Type   Cancer Type IDC/DCIS Right breast      Surgeries   Lumpectomy Date 12/29/20    Sentinel Lymph Node Biopsy Date 02/21/21   pending     Treatment   Active Chemotherapy Treatment No    Past Chemotherapy Treatment No    Active Radiation Treatment No   pending   Past Radiation Treatment No    Current Hormone Treatment No   pending   Past Hormone Therapy No      What other symptoms do you have   Are you Having Heaviness or Tightness No    Are you having Pain No    Are you having pitting edema No    Is it Hard or Difficult finding clothes that fit No    Do you have infections No    Is there Decreased scar mobility No      Right Upper Extremity Lymphedema   10 cm Proximal to Olecranon Process 25.6 cm    Olecranon Process 23.4 cm    10 cm Proximal to Ulnar Styloid Process 20 cm    Just Proximal to Ulnar Styloid Process 14.9 cm    At Base of 2nd Digit 5.8 cm      Left Upper Extremity Lymphedema   10 cm Proximal to Olecranon Process 26.1 cm    Olecranon Process 23.2 cm    10 cm Proximal to Ulnar Styloid Process 18.7 cm    Just Proximal to Ulnar Styloid Process 14.7 cm    At Base of 2nd Digit 6 cm             L-DEX FLOWSHEETS - 02/15/21 1200       L-DEX LYMPHEDEMA SCREENING   Measurement Type Unilateral    L-DEX MEASUREMENT EXTREMITY Upper Extremity    POSITION  Standing    DOMINANT SIDE Right    At Risk Side Right    BASELINE SCORE (UNILATERAL) -1.7                  Quick Dash - 02/15/21 0001     Open a tight or new jar No difficulty    Do heavy household chores (wash walls, wash floors) Mild difficulty    Carry a shopping bag or briefcase Mild difficulty    Wash your back No difficulty    Use a knife to  cut food No difficulty    Recreational activities in which you take some force or impact through your arm, shoulder, or hand  (golf, hammering, tennis) Mild difficulty    During the past week, to what extent has your arm, shoulder or hand problem interfered with your normal social activities with family, friends, neighbors, or groups? Not at all    During the past week, to what extent has your arm, shoulder or hand problem limited your work or other regular daily activities Not at all    Arm, shoulder, or hand pain. None    Tingling (pins and needles) in your arm, shoulder, or hand None    Difficulty Sleeping No difficulty    DASH Score 6.82 %              Objective measurements completed on examination: See above findings.               PT Education - 02/15/21 1231     Education Details 4 post op exs, ABC class set up, SOZO screen performed, Skin care/lymphedema precautions    Person(s) Educated Patient    Methods Explanation;Handout    Comprehension Returned demonstration;Verbalized understanding                 PT Long Term Goals - 02/15/21 1252       PT LONG TERM GOAL #1   Title Pt will have full post op Right shoulder ROM to resume all usual home activities    Time 6    Period Weeks    Status New    Target Date 03/29/21             Breast Clinic Goals - 02/15/21 1251       Patient will be able to verbalize understanding of pertinent lymphedema risk reduction practices relevant to her diagnosis specifically related to skin care.   Time 1    Period Days    Status Achieved    Target Date 02/15/21      Patient will be able to return demonstrate and/or verbalize understanding of the post-op home exercise program related to regaining shoulder range of motion.   Time 1    Period Days    Status Achieved    Target Date 02/15/21      Patient will be able to verbalize understanding of the importance of attending the postoperative After Breast Cancer Class for further lymphedema risk reduction education and therapeutic exercise.   Time 1    Period Days    Status Achieved     Target Date 02/15/21                   Plan - 02/15/21 1233     Clinical Impression Statement Pt is s/p right lumpectomy but is now pending a SLNB on 02/21/2021.  She is wanting to be proactive and learn as much as she can prior to haveing SLNB.  She was educated in 4 post op exs.  She was set up for 03/06/2021 ABC class, and a SOZO screen 3 months after her surgery.  She had baseline SOZO today.She was also set up for a post op reassessment.    Personal Factors and Comorbidities Comorbidity 1    Comorbidities Right breast CA pending SLNB/radiation    Stability/Clinical Decision Making Stable/Uncomplicated    Clinical Decision Making Low    Rehab Potential Excellent    PT Frequency 1x / week    PT Duration 6 weeks  PT Treatment/Interventions Patient/family education;Therapeutic exercise    PT Next Visit Plan Reassess after SLNB,    PT Home Exercise Plan 4 post op exercises    Recommended Other Services sleeve?    Consulted and Agree with Plan of Care Patient           Patient will follow up at outpatient cancer rehab 3-4 weeks following surgery.  If the patient requires physical therapy at that time, a specific plan will be dictated and sent to the referring physician for approval. The patient was educated today on appropriate basic range of motion exercises to begin post operatively and the importance of attending the After Breast Cancer class following surgery.  Patient was educated today on lymphedema risk reduction practices as it pertains to recommendations that will benefit the patient immediately following surgery.  She verbalized good understanding.   The patient was assessed using the L-Dex machine today to produce a lymphedema index baseline score. The patient will be reassessed on a regular basis (typically every 3 months) to obtain new L-Dex scores. If the score is > 6.5 points away from his/her baseline score indicating onset of subclinical lymphedema, it will be  recommended to wear a compression garment for 4 weeks, 12 hours per day and then be reassessed. If the score continues to be > 6.5 points from baseline at reassessment, we will initiate lymphedema treatment. Assessing in this manner has a 95% rate of preventing clinically significant lymphedema.   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

## 2021-02-21 ENCOUNTER — Ambulatory Visit (HOSPITAL_BASED_OUTPATIENT_CLINIC_OR_DEPARTMENT_OTHER): Payer: 59 | Admitting: Certified Registered"

## 2021-02-21 ENCOUNTER — Encounter (HOSPITAL_COMMUNITY)
Admission: RE | Admit: 2021-02-21 | Discharge: 2021-02-21 | Disposition: A | Discharge status: Home / Self Care | Payer: 59 | Source: Ambulatory Visit | Attending: General Surgery | Admitting: General Surgery

## 2021-02-21 ENCOUNTER — Ambulatory Visit (HOSPITAL_COMMUNITY): Payer: 59

## 2021-02-21 ENCOUNTER — Other Ambulatory Visit: Payer: Self-pay

## 2021-02-21 ENCOUNTER — Encounter (HOSPITAL_BASED_OUTPATIENT_CLINIC_OR_DEPARTMENT_OTHER)
Admission: RE | Disposition: A | Discharge status: Home / Self Care | Payer: Self-pay | Source: Home / Self Care | Attending: General Surgery

## 2021-02-21 ENCOUNTER — Encounter (HOSPITAL_BASED_OUTPATIENT_CLINIC_OR_DEPARTMENT_OTHER): Payer: Self-pay | Admitting: General Surgery

## 2021-02-21 ENCOUNTER — Ambulatory Visit (HOSPITAL_BASED_OUTPATIENT_CLINIC_OR_DEPARTMENT_OTHER)
Admission: RE | Admit: 2021-02-21 | Discharge: 2021-02-21 | Disposition: A | Discharge status: Home / Self Care | Payer: 59 | Attending: General Surgery | Admitting: General Surgery

## 2021-02-21 DIAGNOSIS — Z170 Estrogen receptor positive status [ER+]: Secondary | ICD-10-CM

## 2021-02-21 DIAGNOSIS — C50411 Malignant neoplasm of upper-outer quadrant of right female breast: Secondary | ICD-10-CM | POA: Insufficient documentation

## 2021-02-21 DIAGNOSIS — Z881 Allergy status to other antibiotic agents status: Secondary | ICD-10-CM | POA: Diagnosis not present

## 2021-02-21 DIAGNOSIS — Z880 Allergy status to penicillin: Secondary | ICD-10-CM | POA: Insufficient documentation

## 2021-02-21 DIAGNOSIS — Z882 Allergy status to sulfonamides status: Secondary | ICD-10-CM | POA: Insufficient documentation

## 2021-02-21 DIAGNOSIS — Z79899 Other long term (current) drug therapy: Secondary | ICD-10-CM | POA: Insufficient documentation

## 2021-02-21 HISTORY — PX: SENTINEL NODE BIOPSY: SHX6608

## 2021-02-21 LAB — POCT PREGNANCY, URINE: Preg Test, Ur: NEGATIVE

## 2021-02-21 SURGERY — BIOPSY, LYMPH NODE, SENTINEL
Anesthesia: General | Site: Axilla | Laterality: Right

## 2021-02-21 MED ORDER — GABAPENTIN 300 MG PO CAPS
300.0000 mg | ORAL_CAPSULE | ORAL | Status: AC
  Administered 2021-02-21: 300 mg via ORAL

## 2021-02-21 MED ORDER — ACETAMINOPHEN 500 MG PO TABS
ORAL_TABLET | ORAL | Status: AC
  Filled 2021-02-21: qty 2

## 2021-02-21 MED ORDER — AMISULPRIDE (ANTIEMETIC) 5 MG/2ML IV SOLN: 10.0000 mg | Freq: Once | INTRAVENOUS | Status: DC | PRN

## 2021-02-21 MED ORDER — MEPERIDINE HCL 25 MG/ML IJ SOLN: 6.2500 mg | INTRAMUSCULAR | Status: DC | PRN

## 2021-02-21 MED ORDER — METHYLENE BLUE 0.5 % INJ SOLN
INTRAVENOUS | Status: AC
  Filled 2021-02-21: qty 30

## 2021-02-21 MED ORDER — DIPHENHYDRAMINE HCL 50 MG/ML IJ SOLN
INTRAMUSCULAR | Status: AC
  Filled 2021-02-21: qty 1

## 2021-02-21 MED ORDER — VANCOMYCIN HCL IN DEXTROSE 1-5 GM/200ML-% IV SOLN
1000.0000 mg | INTRAVENOUS | Status: AC
  Administered 2021-02-21: 1000 mg via INTRAVENOUS

## 2021-02-21 MED ORDER — BUPIVACAINE-EPINEPHRINE 0.25% -1:200000 IJ SOLN
INTRAMUSCULAR | Status: DC | PRN
  Administered 2021-02-21: 9 mL

## 2021-02-21 MED ORDER — MIDAZOLAM HCL 2 MG/2ML IJ SOLN
INTRAMUSCULAR | Status: AC
  Filled 2021-02-21: qty 2

## 2021-02-21 MED ORDER — FENTANYL CITRATE (PF) 100 MCG/2ML IJ SOLN
INTRAMUSCULAR | Status: AC
  Filled 2021-02-21: qty 2

## 2021-02-21 MED ORDER — PROPOFOL 10 MG/ML IV BOLUS
INTRAVENOUS | Status: DC | PRN
Start: 2021-02-21 — End: 2021-02-21
  Administered 2021-02-21: 200 mg via INTRAVENOUS

## 2021-02-21 MED ORDER — VANCOMYCIN HCL IN DEXTROSE 1-5 GM/200ML-% IV SOLN
INTRAVENOUS | Status: AC
  Filled 2021-02-21: qty 200

## 2021-02-21 MED ORDER — SODIUM CHLORIDE (PF) 0.9 % IJ SOLN
INTRAMUSCULAR | Status: AC
  Filled 2021-02-21: qty 30

## 2021-02-21 MED ORDER — FENTANYL CITRATE (PF) 100 MCG/2ML IJ SOLN
100.0000 ug | Freq: Once | INTRAMUSCULAR | Status: AC
  Administered 2021-02-21: 100 ug via INTRAVENOUS

## 2021-02-21 MED ORDER — LIDOCAINE HCL (CARDIAC) PF 100 MG/5ML IV SOSY
PREFILLED_SYRINGE | INTRAVENOUS | Status: DC | PRN
  Administered 2021-02-21: 60 mg via INTRAVENOUS

## 2021-02-21 MED ORDER — CHLORHEXIDINE GLUCONATE CLOTH 2 % EX PADS: 6.0000 | MEDICATED_PAD | Freq: Once | CUTANEOUS | Status: DC

## 2021-02-21 MED ORDER — HYDROCODONE-ACETAMINOPHEN 5-325 MG PO TABS: 1.0000 | ORAL_TABLET | Freq: Four times a day (QID) | ORAL | 0 refills | Status: DC | PRN

## 2021-02-21 MED ORDER — TECHNETIUM TC 99M TILMANOCEPT KIT
1.0000 | PACK | Freq: Once | INTRAVENOUS | Status: AC | PRN
  Administered 2021-02-21: 1 via INTRADERMAL

## 2021-02-21 MED ORDER — ONDANSETRON HCL 4 MG/2ML IJ SOLN
INTRAMUSCULAR | Status: AC
  Filled 2021-02-21: qty 2

## 2021-02-21 MED ORDER — GABAPENTIN 300 MG PO CAPS
ORAL_CAPSULE | ORAL | Status: AC
  Filled 2021-02-21: qty 1

## 2021-02-21 MED ORDER — MIDAZOLAM HCL 2 MG/2ML IJ SOLN
2.0000 mg | Freq: Once | INTRAMUSCULAR | Status: AC
  Administered 2021-02-21: 2 mg via INTRAVENOUS

## 2021-02-21 MED ORDER — DIPHENHYDRAMINE HCL 50 MG/ML IJ SOLN
INTRAMUSCULAR | Status: DC | PRN
  Administered 2021-02-21: 12.5 mg via INTRAVENOUS

## 2021-02-21 MED ORDER — OXYCODONE HCL 5 MG PO TABS: 5.0000 mg | ORAL_TABLET | Freq: Once | ORAL | Status: DC | PRN

## 2021-02-21 MED ORDER — ACETAMINOPHEN 500 MG PO TABS
1000.0000 mg | ORAL_TABLET | ORAL | Status: AC
  Administered 2021-02-21: 1000 mg via ORAL

## 2021-02-21 MED ORDER — PROMETHAZINE HCL 25 MG/ML IJ SOLN: 6.2500 mg | INTRAMUSCULAR | Status: DC | PRN

## 2021-02-21 MED ORDER — HYDROMORPHONE HCL 1 MG/ML IJ SOLN: 0.2500 mg | INTRAMUSCULAR | Status: DC | PRN

## 2021-02-21 MED ORDER — DEXAMETHASONE SODIUM PHOSPHATE 10 MG/ML IJ SOLN
INTRAMUSCULAR | Status: DC | PRN
  Administered 2021-02-21 (×2): 10 mg via INTRAVENOUS

## 2021-02-21 MED ORDER — EPHEDRINE 5 MG/ML INJ
INTRAVENOUS | Status: AC
  Filled 2021-02-21: qty 10

## 2021-02-21 MED ORDER — OXYCODONE HCL 5 MG/5ML PO SOLN: 5.0000 mg | Freq: Once | ORAL | Status: DC | PRN

## 2021-02-21 MED ORDER — ROPIVACAINE HCL 5 MG/ML IJ SOLN
INTRAMUSCULAR | Status: DC | PRN
  Administered 2021-02-21: 30 mL via PERINEURAL

## 2021-02-21 MED ORDER — LACTATED RINGERS IV SOLN: INTRAVENOUS | Status: DC

## 2021-02-21 MED ORDER — BUPIVACAINE-EPINEPHRINE (PF) 0.25% -1:200000 IJ SOLN
INTRAMUSCULAR | Status: AC
  Filled 2021-02-21: qty 90

## 2021-02-21 MED ORDER — ONDANSETRON HCL 4 MG/2ML IJ SOLN
INTRAMUSCULAR | Status: DC | PRN
  Administered 2021-02-21: 4 mg via INTRAVENOUS

## 2021-02-21 SURGICAL SUPPLY — 44 items
ADH SKN CLS APL DERMABOND .7 (GAUZE/BANDAGES/DRESSINGS) ×1
APL PRP STRL LF DISP 70% ISPRP (MISCELLANEOUS)
APPLIER CLIP 11 MED OPEN (CLIP) ×2
APR CLP MED 11 20 MLT OPN (CLIP) ×1
BLADE SURG 15 STRL LF DISP TIS (BLADE) ×1 IMPLANT
BLADE SURG 15 STRL SS (BLADE) ×2
CANISTER SUCT 1200ML W/VALVE (MISCELLANEOUS) ×2 IMPLANT
CHLORAPREP W/TINT 26 (MISCELLANEOUS) IMPLANT
CLIP APPLIE 11 MED OPEN (CLIP) ×1 IMPLANT
COVER BACK TABLE 60X90IN (DRAPES) ×2 IMPLANT
COVER MAYO STAND STRL (DRAPES) ×2 IMPLANT
COVER PROBE W GEL 5X96 (DRAPES) ×2 IMPLANT
DECANTER SPIKE VIAL GLASS SM (MISCELLANEOUS) IMPLANT
DERMABOND ADVANCED (GAUZE/BANDAGES/DRESSINGS) ×1
DERMABOND ADVANCED .7 DNX12 (GAUZE/BANDAGES/DRESSINGS) ×1 IMPLANT
DRAPE LAPAROSCOPIC ABDOMINAL (DRAPES) ×2 IMPLANT
DRAPE UTILITY XL STRL (DRAPES) ×2 IMPLANT
ELECT COATED BLADE 2.86 ST (ELECTRODE) ×2 IMPLANT
ELECT REM PT RETURN 9FT ADLT (ELECTROSURGICAL) ×2
ELECTRODE REM PT RTRN 9FT ADLT (ELECTROSURGICAL) ×1 IMPLANT
GLOVE SURG ENC MOIS LTX SZ7.5 (GLOVE) ×2 IMPLANT
GLOVE SURG POLYISO LF SZ7 (GLOVE) ×2 IMPLANT
GLOVE SURG UNDER POLY LF SZ7 (GLOVE) ×4 IMPLANT
GOWN STRL REUS W/ TWL LRG LVL3 (GOWN DISPOSABLE) ×3 IMPLANT
GOWN STRL REUS W/TWL LRG LVL3 (GOWN DISPOSABLE) ×6
ILLUMINATOR WAVEGUIDE N/F (MISCELLANEOUS) IMPLANT
KIT MARKER MARGIN INK (KITS) IMPLANT
LIGHT WAVEGUIDE WIDE FLAT (MISCELLANEOUS) IMPLANT
NDL SAFETY ECLIPSE 18X1.5 (NEEDLE) IMPLANT
NEEDLE HYPO 18GX1.5 SHARP (NEEDLE)
NEEDLE HYPO 25X1 1.5 SAFETY (NEEDLE) ×2 IMPLANT
NS IRRIG 1000ML POUR BTL (IV SOLUTION) ×2 IMPLANT
PACK BASIN DAY SURGERY FS (CUSTOM PROCEDURE TRAY) ×2 IMPLANT
PENCIL SMOKE EVACUATOR (MISCELLANEOUS) ×2 IMPLANT
SLEEVE SCD COMPRESS KNEE MED (STOCKING) ×2 IMPLANT
SPONGE T-LAP 18X18 ~~LOC~~+RFID (SPONGE) ×2 IMPLANT
SUT MON AB 4-0 PC3 18 (SUTURE) ×2 IMPLANT
SUT SILK 3 0 PS 1 (SUTURE) IMPLANT
SUT VICRYL 3-0 CR8 SH (SUTURE) ×2 IMPLANT
SYR CONTROL 10ML LL (SYRINGE) ×2 IMPLANT
TOWEL GREEN STERILE FF (TOWEL DISPOSABLE) ×2 IMPLANT
TRAY FAXITRON CT DISP (TRAY / TRAY PROCEDURE) IMPLANT
TUBE CONNECTING 20X1/4 (TUBING) ×2 IMPLANT
YANKAUER SUCT BULB TIP NO VENT (SUCTIONS) ×2 IMPLANT

## 2021-02-21 NOTE — Anesthesia Procedure Notes (Signed)
Anesthesia Regional Block: Pectoralis block   Pre-Anesthetic Checklist: , timeout performed,  Correct Patient, Correct Site, Correct Laterality,  Correct Procedure, Correct Position, site marked,  Risks and benefits discussed,  Surgical consent,  Pre-op evaluation,  At surgeon's request and post-op pain management  Laterality: Right  Prep: alcohol swabs       Needles:  Injection technique: Single-shot  Needle Type: Stimiplex     Needle Length: 9cm  Needle Gauge: 21     Additional Needles:   Procedures:,,,, ultrasound used (permanent image in chart),,    Narrative:  Start time: 02/21/2021 8:24 AM End time: 02/21/2021 8:29 AM Injection made incrementally with aspirations every 5 mL.  Performed by: Personally  Anesthesiologist: Lynda Rainwater, MD

## 2021-02-21 NOTE — Interval H&P Note (Signed)
History and Physical Interval Note:  02/21/2021 8:32 AM  Ann Buckley  has presented today for surgery, with the diagnosis of RIGHT BREAST CANCER.  The various methods of treatment have been discussed with the patient and family. After consideration of risks, benefits and other options for treatment, the patient has consented to  Procedure(s) with comments: RIGHT SENTINEL NODE BIOPSY (Right) - 60 MINUTES ROOM 5 as a surgical intervention.  The patient's history has been reviewed, patient examined, no change in status, stable for surgery.  I have reviewed the patient's chart and labs.  Questions were answered to the patient's satisfaction.     [name]

## 2021-02-21 NOTE — Op Note (Signed)
02/21/2021  9:54 AM  PATIENT:  Ann Buckley  40 y.o. female  PRE-OPERATIVE DIAGNOSIS:  RIGHT BREAST CANCER  POST-OPERATIVE DIAGNOSIS:  RIGHT BREAST CANCER  PROCEDURE:  Procedure(s) with comments: RIGHT SENTINEL NODE BIOPSY (Right) - 60 MINUTES ROOM 5  SURGEON:  Surgeon(s) and Role:    * Jovita Kussmaul, MD - Primary  PHYSICIAN ASSISTANT:   ASSISTANTS: none   ANESTHESIA:   local and general  EBL:  5 mL   BLOOD ADMINISTERED:none  DRAINS: none   LOCAL MEDICATIONS USED:  MARCAINE     SPECIMEN:  Source of Specimen:  sentinel node x 2  DISPOSITION OF SPECIMEN:  PATHOLOGY  COUNTS:  YES  TOURNIQUET:  * No tourniquets in log *  DICTATION: .Dragon Dictation  After informed consent was obtained the patient was brought to the operating room and placed in the supine position on the operating table.  After adequate induction of general anesthesia the patient's right chest, breast, and axillary area were prepped with Betadine and draped in usual sterile manner.  An appropriate timeout was performed.  Earlier in the day the patient underwent injection of 1 mCi of technetium sulfur colloid in the subareolar position on the right.  The neoprobe was then set to technetium and there was a good signal in the right axilla.  The area overlying this was infiltrated with quarter percent Marcaine.  A small transversely oriented incision was made with a 15 blade knife overlying the area of radioactivity.  The incision was carried through the skin and subcutaneous tissue sharply with the electrocautery until the deep right axillary space was entered.  The neoprobe was used to direct blunt hemostat dissection.  I was able to identify 2 hot lymph nodes.  These were excised sharply with the electrocautery and the surrounding small vessels and lymphatics were controlled with clips.  Ex vivo counts on these nodes ranged from 200 to 800.  There may have been 2 nodes in one of the specimens.  No  other hot or palpable nodes were identified in the right axilla.  Hemostasis was achieved using the Bovie electrocautery.  The deep layer of the incision was then closed with interrupted 3-0 Vicryl stitches.  The skin was closed with a running 4-0 Monocryl subcuticular stitch.  Dermabond dressings were applied.  The patient tolerated the procedure well.  At the end of the case all needle sponge and instrument counts were correct.  The patient was then awakened and taken to recovery in stable condition.  PLAN OF CARE: Discharge to home after PACU  PATIENT DISPOSITION:  PACU - hemodynamically stable.   Delay start of Pharmacological VTE agent (>24hrs) due to surgical blood loss or risk of bleeding: not applicable

## 2021-02-21 NOTE — Discharge Instructions (Signed)
  Post Anesthesia Home Care Instructions  Activity: Get plenty of rest for the remainder of the day. A responsible individual must stay with you for 24 hours following the procedure.  For the next 24 hours, DO NOT: -Drive a car -Paediatric nurse -Drink alcoholic beverages -Take any medication unless instructed by your physician -Make any legal decisions or sign important papers.  Meals: Start with liquid foods such as gelatin or soup. Progress to regular foods as tolerated. Avoid greasy, spicy, heavy foods. If nausea and/or vomiting occur, drink only clear liquids until the nausea and/or vomiting subsides. Call your physician if vomiting continues.  Special Instructions/Symptoms: Your throat may feel dry or sore from the anesthesia or the breathing tube placed in your throat during surgery. If this causes discomfort, gargle with warm salt water. The discomfort should disappear within 24 hours.  If you had a scopolamine patch placed behind your ear for the management of post- operative nausea and/or vomiting:  1. The medication in the patch is effective for 72 hours, after which it should be removed.  Wrap patch in a tissue and discard in the trash. Wash hands thoroughly with soap and water. 2. You may remove the patch earlier than 72 hours if you experience unpleasant side effects which may include dry mouth, dizziness or visual disturbances. 3. Avoid touching the patch. Wash your hands with soap and water after contact with the patch.    Regional Anesthesia Blocks  1. Numbness or the inability to move the "blocked" extremity may last from 3-48 hours after placement. The length of time depends on the medication injected and your individual response to the medication. If the numbness is not going away after 48 hours, call your surgeon.  2. The extremity that is blocked will need to be protected until the numbness is gone and the  Strength has returned. Because you cannot feel it, you will  need to take extra care to avoid injury. Because it may be weak, you may have difficulty moving it or using it. You may not know what position it is in without looking at it while the block is in effect.  3. For blocks in the legs and feet, returning to weight bearing and walking needs to be done carefully. You will need to wait until the numbness is entirely gone and the strength has returned. You should be able to move your leg and foot normally before you try and bear weight or walk. You will need someone to be with you when you first try to ensure you do not fall and possibly risk injury.  4. Bruising and tenderness at the needle site are common side effects and will resolve in a few days.  5. Persistent numbness or new problems with movement should be communicated to the surgeon or the [place] [phone] [place] [phone]).    Next dose of tylenol can be taken at 1330pm today.

## 2021-02-21 NOTE — Transfer of Care (Signed)
Immediate Anesthesia Transfer of Care Note  Patient: Ann Buckley  Procedure(s) Performed: RIGHT SENTINEL NODE BIOPSY (Right: Axilla)  Patient Location: PACU  Anesthesia Type:General  Level of Consciousness: drowsy and patient cooperative  Airway & Oxygen Therapy: Patient Spontanous Breathing and Patient connected to face mask oxygen  Post-op Assessment: Report given to RN and Post -op Vital signs reviewed and stable  Post vital signs: Reviewed and stable  Last Vitals:  Vitals Value Taken Time  BP 119/77 02/21/21 0950  Temp    Pulse 69 02/21/21 0950  Resp 19 02/21/21 0950  SpO2 100 % 02/21/21 0950  Vitals shown include unvalidated device data.  Last Pain:  Vitals:   02/21/21 0825  TempSrc:   PainSc: 0-No pain      Patients Stated Pain Goal: 3 ([id number])  Complications: No notable events documented.

## 2021-02-21 NOTE — Progress Notes (Signed)
Time out completed with nuclear med.  Patient tolerated injections well.

## 2021-02-21 NOTE — Progress Notes (Signed)
Assisted Dr. Miller with right, ultrasound guided, pectoralis block. Side rails up, monitors on throughout procedure. See vital signs in flow sheet. Tolerated Procedure well. 

## 2021-02-21 NOTE — Anesthesia Postprocedure Evaluation (Signed)
Anesthesia Post Note  Patient: Ann Buckley  Procedure(s) Performed: RIGHT SENTINEL NODE BIOPSY (Right: Axilla)     Patient location during evaluation: PACU Anesthesia Type: General Level of consciousness: awake and alert Pain management: pain level controlled Vital Signs Assessment: post-procedure vital signs reviewed and stable Respiratory status: spontaneous breathing, nonlabored ventilation and respiratory function stable Cardiovascular status: blood pressure returned to baseline and stable Postop Assessment: no apparent nausea or vomiting Anesthetic complications: no   No notable events documented.  Last Vitals:  Vitals:   02/21/21 1030 02/21/21 1106  BP: 122/80 111/78  Pulse: 63 74  Resp: 17 18  Temp:  36.8 C  SpO2: 98% 98%    Last Pain:  Vitals:   02/21/21 1106  TempSrc:   PainSc: 0-No pain                 Lynda Rainwater

## 2021-02-21 NOTE — Anesthesia Procedure Notes (Signed)
Procedure Name: LMA Insertion Date/Time: 02/21/2021 9:08 AM Performed by: Lavonia Dana, CRNA Pre-anesthesia Checklist: Patient identified, Emergency Drugs available, Suction available and Patient being monitored Patient Re-evaluated:Patient Re-evaluated prior to induction Oxygen Delivery Method: Circle system utilized Preoxygenation: Pre-oxygenation with 100% oxygen Induction Type: IV induction Ventilation: Mask ventilation without difficulty LMA: LMA inserted LMA Size: 4.0 Number of attempts: 1 Airway Equipment and Method: Bite block Placement Confirmation: positive ETCO2 Tube secured with: Tape Dental Injury: Teeth and Oropharynx as per pre-operative assessment

## 2021-02-21 NOTE — Anesthesia Preprocedure Evaluation (Signed)
Anesthesia Evaluation  Patient identified by MRN, date of birth, ID band Patient awake    Reviewed: Allergy & Precautions, NPO status , Patient's Chart, lab work & pertinent test results  Airway Mallampati: I  TM Distance: >3 FB Neck ROM: Full    Dental no notable dental hx.    Pulmonary neg pulmonary ROS,    Pulmonary exam normal breath sounds clear to auscultation       Cardiovascular negative cardio ROS Normal cardiovascular exam Rhythm:Regular Rate:Normal     Neuro/Psych Anxiety negative neurological ROS     GI/Hepatic negative GI ROS, (+)     substance abuse  marijuana use,   Endo/Other  PCOS (polycystic ovarian syndrome)  Renal/GU negative Renal ROS     Musculoskeletal negative musculoskeletal ROS (+)   Abdominal   Peds  Hematology negative hematology ROS (+)   Anesthesia Other Findings RIGHT BREAST DCIS  Reproductive/Obstetrics hcg negative                             Anesthesia Physical  Anesthesia Plan  ASA: 3  Anesthesia Plan: General   Post-op Pain Management:  Regional for Post-op pain   Induction: Intravenous  PONV Risk Score and Plan: 3 and Ondansetron, Dexamethasone, Midazolam and Treatment may vary due to age or medical condition  Airway Management Planned: LMA  Additional Equipment:   Intra-op Plan:   Post-operative Plan: Extubation in OR  Informed Consent: I have reviewed the patients History and Physical, chart, labs and discussed the procedure including the risks, benefits and alternatives for the proposed anesthesia with the patient or authorized representative who has indicated his/her understanding and acceptance.     Dental advisory given  Plan Discussed with: CRNA  Anesthesia Plan Comments:         Anesthesia Quick Evaluation

## 2021-02-21 NOTE — H&P (Signed)
PROVIDER:  Landry Corporal, MD   MRN: [id number] DOB: [birth year] DATE OF ENCOUNTER:  Subjective    Chief Complaint: Post Operative Visit (Sx 01/17/21)       History of Present Illness: Ann Buckley is a 40 y.o. female who is seen today for right breast cancer.  The patient is a 40 year old female who is about 3 weeks status post right breast lumpectomy for what was thought to be DCIS.  She ended up having a 2 mm focus of invasive cancer that was grade 1.  The cancer was ER and PR positive and HER2 negative with a Ki-67 of 2%.  She is scheduled for sentinel lymph node biopsy next week.  Her postoperative course was complicated by a rash in the distribution of her skin prep.  I suspect she has a contact dermatitis to the ChloraPrep       Review of Systems: A complete review of systems was obtained from the patient.  I have reviewed this information and discussed as appropriate with the patient.  See HPI as well for other ROS.   ROS      Medical History: Past Medical History      Past Medical History:  Diagnosis Date   History of cancer             Patient Active Problem List  Diagnosis   Malignant neoplasm of upper-outer quadrant of right breast in female, estrogen receptor positive (CMS-HCC)      Past Surgical History  History reviewed. No pertinent surgical history.      Allergies       Allergies  Allergen Reactions   Penicillins Rash      Tolerates ancef     Sulfamethoxazole-Trimethoprim Rash              Current Outpatient Medications on File Prior to Visit  Medication Sig Dispense Refill   ALPRAZolam (XANAX) 1 MG tablet 1 tablet       clotrimazole-betamethasone (LOTRISONE) 1-0.05 % cream Apply 1 Application topically 2 (two) times daily       hydrOXYzine (ATARAX) 25 MG tablet 1-2 tablet at bedtime as needed,for sleep        No current facility-administered medications on file prior to visit.      Family History  History reviewed.  No pertinent family history.      Social History       Tobacco Use  Smoking Status Never Smoker  Smokeless Tobacco Never Used      Social History  Social History        Socioeconomic History   Marital status: Married  Tobacco Use   Smoking status: Never Smoker   Smokeless tobacco: Never Used        Objective:         Vitals:     Pulse: 88  Temp: 37.8 C (100 F)  SpO2: 100%  Weight: 68.4 kg (150 lb 12.8 oz)  Height: 167.6 cm ('5\' 6"' )    Body mass index is 24.34 kg/m.   Physical Exam Vitals reviewed.  Constitutional:      General: She is not in acute distress.    Appearance: Normal appearance.  HENT:     Head: Normocephalic and atraumatic.     Right Ear: External ear normal.     Left Ear: External ear normal.     Nose: Nose normal.     Mouth/Throat:     Mouth: Mucous membranes are moist.  Pharynx: Oropharynx is clear.  Eyes:     General: No scleral icterus.    Extraocular Movements: Extraocular movements intact.     Conjunctiva/sclera: Conjunctivae normal.     Pupils: Pupils are equal, round, and reactive to light.  Cardiovascular:     Rate and Rhythm: Normal rate and regular rhythm.     Pulses: Normal pulses.     Heart sounds: Normal heart sounds.  Pulmonary:     Effort: Pulmonary effort is normal. No respiratory distress.     Breath sounds: Normal breath sounds.  Abdominal:     General: Bowel sounds are normal.     Palpations: Abdomen is soft.     Tenderness: There is no abdominal tenderness.  Musculoskeletal:        General: No swelling, tenderness or deformity. Normal range of motion.     Cervical back: Normal range of motion and neck supple.  Skin:    General: Skin is warm and dry.     Coloration: Skin is not jaundiced.  Neurological:     General: No focal deficit present.     Mental Status: She is alert and oriented to person, place, and time.  Psychiatric:        Mood and Affect: Mood normal.        Behavior: Behavior normal.     Breast: The right breast periareolar incision is healing nicely with no sign of infection or seroma         Labs, Imaging and Diagnostic Testing:         Assessment and Plan:  Diagnoses and all orders for this visit:   Malignant neoplasm of upper-outer quadrant of right breast in female, estrogen receptor positive (CMS-HCC) -     clotrimazole-betamethasone (LOTRISONE) 1-0.05 % cream; Apply topically 2 (two) times daily -     hydrOXYzine HCL (ATARAX) 10 MG tablet; Take 1 tablet (10 mg total) by mouth 3 (three) times daily as needed for Itching for up to 10 days       The patient is about 3 weeks status post right breast lumpectomy for breast cancer.  She is scheduled for sentinel lymph node biopsy next week.  I have discussed with her in detail the risks and benefits of the operation as well as some of the technical aspects and she understands and wishes to proceed.  She will also follow-up with medical and radiation oncology for adjuvant therapy.

## 2021-02-22 ENCOUNTER — Encounter (HOSPITAL_BASED_OUTPATIENT_CLINIC_OR_DEPARTMENT_OTHER): Payer: Self-pay | Admitting: General Surgery

## 2021-02-22 LAB — SURGICAL PATHOLOGY

## 2021-02-27 ENCOUNTER — Encounter: Payer: Self-pay | Admitting: *Deleted

## 2021-03-01 ENCOUNTER — Encounter: Payer: Self-pay | Admitting: Radiation Oncology

## 2021-03-02 ENCOUNTER — Encounter: Payer: Self-pay | Admitting: Radiation Oncology

## 2021-03-07 ENCOUNTER — Encounter: Payer: Self-pay | Admitting: Hematology and Oncology

## 2021-03-07 ENCOUNTER — Encounter: Payer: Self-pay | Admitting: Radiation Oncology

## 2021-03-09 ENCOUNTER — Ambulatory Visit: Payer: 59 | Attending: General Surgery

## 2021-03-09 ENCOUNTER — Telehealth: Payer: Self-pay | Admitting: Licensed Clinical Social Worker

## 2021-03-09 ENCOUNTER — Other Ambulatory Visit: Payer: Self-pay

## 2021-03-09 DIAGNOSIS — D0511 Intraductal carcinoma in situ of right breast: Secondary | ICD-10-CM | POA: Diagnosis present

## 2021-03-09 DIAGNOSIS — R293 Abnormal posture: Secondary | ICD-10-CM | POA: Insufficient documentation

## 2021-03-09 NOTE — Telephone Encounter (Signed)
[place] [facility]  CSW received e-mail forwarded from support services admin and nurse navigator from patient inquiring re: therapist options.  CSW attempted to reach patient by phone to discuss further. No answer, left VM with direct contact information. Also sent e-mail back to patient to discuss options.   Christeen Douglas, LCSW

## 2021-03-09 NOTE — Therapy (Signed)
[place], [place], [address] Phone: [phone]   Fax:  [phone]  Physical Therapy Treatment  Patient Details  Name: Ann Buckley MRN: [id number] Date of Birth: [birth year] Referring Provider (PT): Dr. Marlou Starks   Encounter Date: 03/09/2021   PT End of Session - 03/09/21 1317     Visit Number 2    Number of Visits 2    Date for PT Re-Evaluation 03/09/21    PT Start Time 0901    PT Stop Time 1000    PT Time Calculation (min) 59 min    Activity Tolerance Patient tolerated treatment well    Behavior During Therapy [facility] for tasks assessed/performed             Past Medical History:  Diagnosis Date   Abnormal Pap smear of cervix 2001   Anxiety    Family history of breast cancer    PCOS (polycystic ovarian syndrome)    STD (sexually transmitted disease)    hsv1    Past Surgical History:  Procedure Laterality Date   BREAST LUMPECTOMY WITH RADIOACTIVE SEED LOCALIZATION Right 01/18/2021   Procedure: RIGHT BREAST LUMPECTOMY WITH RADIOACTIVE SEED LOCALIZATION X 2;  Surgeon: Jovita Kussmaul, MD;  Location: [facility];  Service: General;  Laterality: Right;   CERVICAL BIOPSY  W/ LOOP ELECTRODE EXCISION  Age 33 2001   Paps have been normal eversince    COLPOSCOPY     INTRAUTERINE DEVICE (IUD) INSERTION  07/2017   Mirena    SENTINEL NODE BIOPSY Right 02/21/2021   Procedure: RIGHT SENTINEL NODE BIOPSY;  Surgeon: Jovita Kussmaul, MD;  Location: [facility];  Service: General;  Laterality: Right;  60 MINUTES ROOM 5    There were no vitals filed for this visit.   Subjective Assessment - 03/09/21 0906     Subjective Had the SLNB surgery and it was negative so no chemo. Have been doing exercises since 3 days after and I feel like my ROM is good. I did the ABC Class and I loved it.  I would like to do it again. Arm fatigues at the end of the day and my incision is a little sore at the end  of the day.  I take advil. No noticeable swelling at breast or axillary. Radiation simulation on 03/22/21. Think it will be 33 treatments.    Pertinent History Screening mammogram showed right breast calcifications. Diagnostic mammogram and US showed 3 groups of calcifications in the right breast, largest 1.4cm, with two smaller measuring 0.1cm. 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  She had a right lumpectomy performed on 12/29/20 and is pending a SLNB on 02/21/2021.  She also had a positive genetic test.She will have adjuvant radiation and anti estrogens    Patient Stated Goals reassess after surgery.    Currently in Pain? No/denies    Pain Score 0-No pain                [facility] PT Assessment - 03/09/21 0001       Assessment   Medical Diagnosis Right breast Cancer    Referring Provider (PT) Dr. Marlou Starks    Onset Date/Surgical Date 12/29/20 , 02/21/2021   Hand Dominance Right      Precautions   Precaution Comments lymphedema risk      Restrictions   Weight Bearing Restrictions No      Home Environment   Living Environment  Private residence    Living Arrangements Spouse/significant other;Children    Available Help at Discharge Family      Prior Function   Level of [facility]    Leisure yoga,walking      Cognition   Overall Cognitive Status Within Functional Limits for tasks assessed      Observation/Other Assessments   Observations incisions healed with some scar tissue/fibrosis noted      Posture/Postural Control   Posture/Postural Control No significant limitations      AROM   Right Shoulder Extension 59 Degrees    Right Shoulder Flexion 169 Degrees    Right Shoulder ABduction 180 Degrees    Right Shoulder Internal Rotation 72 Degrees    Right Shoulder External Rotation 110 Degrees               LYMPHEDEMA/ONCOLOGY QUESTIONNAIRE - 03/09/21 0001       Type   Cancer Type IDC/DCIS Right breast      Surgeries    Lumpectomy Date 12/29/20    Sentinel Lymph Node Biopsy Date 02/21/21   pending   Number Lymph Nodes Removed 6      Treatment   Active Chemotherapy Treatment No    Past Chemotherapy Treatment No    Active Radiation Treatment --   to start 03/22/2021   Past Radiation Treatment No    Current Hormone Treatment No    Past Hormone Therapy No      What other symptoms do you have   Are you Having Heaviness or Tightness No    Are you having Pain No    Are you having pitting edema No    Is it Hard or Difficult finding clothes that fit No    Do you have infections No    Is there Decreased scar mobility No      Right Upper Extremity Lymphedema   10 cm Proximal to Olecranon Process 25 cm    Olecranon Process 23.4 cm    10 cm Proximal to Ulnar Styloid Process 19.7 cm    Just Proximal to Ulnar Styloid Process 15.1 cm    At Base of 2nd Digit 6 cm                                     PT Long Term Goals - 03/09/21 1333       PT LONG TERM GOAL #1   Title Pt will have full post op Right shoulder ROM to resume all usual home activities    Time 6    Period Weeks    Status Achieved    Target Date 03/09/21                   Plan - 03/09/21 1326     Clinical Impression Statement Pt is now s/p right lumpectomy on 12/29/20  with SLNB done on 02/21/2021. She had 0/6 LN's.  She will have radiation simulation on 03/22/21.  Reassessment today reveals shoulder ROM WNL and circumferential measurements without concern for lymphedema.  Incisions are well healed although there is noted scar tissue/fibrosis under incisions.  Pt has been instructed in scar massage.  A small chip pack was made to wear in compression bra at axillary incision and a small piece of gray foam in stockinette was made for breast incision.  We discussed skin care and lymphedema precautions.  Discussed changes that may occur with radiation and  that pt should continue to assess her ROM to prevent tightness  from occuring.  She should also check to be sure she is not developing any swelling. Pt was set up to attend the ABC class a second time as she really enjoyed it.  She was given a script to purchase a prophylactic sleeve and we discussed proper wearing of sleeve. She was given my card to call should she have any other questions. She is discharged from PT at this time.    Personal Factors and Comorbidities Comorbidity 1    Comorbidities Right breast CA pending SLNB/radiation    Stability/Clinical Decision Making Stable/Uncomplicated    Rehab Potential Excellent    PT Frequency 1x / week    PT Duration 6 weeks    PT Treatment/Interventions Patient/family education;Therapeutic exercise;Scar mobilization    PT Next Visit Plan Pt is discharged to [facility].    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 Patient goals were met. Patient is being discharged due to meeting the stated rehab goals.  Claris Pong 03/09/2021,  1:36 PM  [place], [place], [address] Phone: [phone]   Fax:  [phone]  Name: Ann Buckley MRN: [id number] Date of Birth: 27-Nov-1980 Cheral Almas, PT 03/09/21 1:39 PM

## 2021-03-21 ENCOUNTER — Encounter: Payer: Self-pay | Admitting: Hematology and Oncology

## 2021-03-22 ENCOUNTER — Ambulatory Visit
Admission: RE | Admit: 2021-03-22 | Discharge: 2021-03-22 | Disposition: A | Discharge status: Home / Self Care | Payer: 59 | Source: Ambulatory Visit | Attending: Radiation Oncology | Admitting: Radiation Oncology

## 2021-03-22 ENCOUNTER — Other Ambulatory Visit: Payer: Self-pay

## 2021-03-22 ENCOUNTER — Ambulatory Visit: Payer: 59 | Admitting: Radiation Oncology

## 2021-03-22 ENCOUNTER — Encounter: Payer: Self-pay | Admitting: Radiation Oncology

## 2021-03-22 VITALS — BP 119/79 | HR 82 | Temp 98.6°F | Resp 18 | Ht 66.0 in | Wt 148.0 lb

## 2021-03-22 DIAGNOSIS — D0511 Intraductal carcinoma in situ of right breast: Secondary | ICD-10-CM

## 2021-03-22 DIAGNOSIS — Z170 Estrogen receptor positive status [ER+]: Secondary | ICD-10-CM | POA: Insufficient documentation

## 2021-03-22 DIAGNOSIS — C50411 Malignant neoplasm of upper-outer quadrant of right female breast: Secondary | ICD-10-CM | POA: Insufficient documentation

## 2021-03-22 DIAGNOSIS — Z510 Encounter for antineoplastic radiation therapy: Secondary | ICD-10-CM | POA: Diagnosis not present

## 2021-03-22 MED ORDER — RADIAPLEXRX EX GEL: Freq: Once | CUTANEOUS | Status: AC

## 2021-03-22 NOTE — Addendum Note (Signed)
Encounter addended by: Hayden Pedro, PA-C on: 03/22/2021 4:46 PM  Actions taken: Level of Service modified, Clinical Note Signed

## 2021-03-22 NOTE — Progress Notes (Signed)
Radiation Oncology         [phone] ________________________________  Name: Ann Buckley        MRN: [id number]  Date of Service: 03/22/2021 DOB: 03-29-1981  [id number], Ann Kaufmann, MD  Ann Lose, MD     REFERRING PHYSICIAN: Nicholas Lose, MD   DIAGNOSIS: The encounter diagnosis was Malignant neoplasm of upper-outer quadrant of right breast in female, estrogen receptor positive ([place]).   HISTORY OF PRESENT ILLNESS: Ann Buckley is a 40 y.o. female originally seen in the multidisciplinary breast clinic for a new diagnosis of right breast cancer. The patient was noted to have screening detected calcifications in the right breast.  Diagnostic imaging showed 4 groups of calcifications spanning 2.8 cm.  She subsequently underwent biopsy on 12/02/2020 of the upper outer right breast which revealed DCIS with calcifications, the cancer was intermediate grade.  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    Since her last visit, the patient underwent MRI imaging of the breast on 12/21/2020 that showed the expected postbiopsy changes in the upper outer quadrant of the right breast as well as a 7 mm enhancing mass in the left central right breast and a 6 mm mass in the upper inner left breast.  She underwent MRI guided biopsies of these sites on 01/02/2021 and the right breast biopsy showed fibrocystic change negative for carcinoma the left biopsy showed fibrocystic change with focal pseudo angiomatous stromal hyperplasia negative for carcinoma.  She underwent right lumpectomy on 01/18/2021 which showed an incidental focus of invasive ductal carcinoma grade 1 measuring 2 mm with associated intermediate grade DCIS with focal necrosis and calcifications.  Her neck resection margins were negative for carcinoma  closest with the anterior margin and 2.5 mm with fibrocystic change and usual ductal hyperplasia as well.  Given these findings she was counseled on the need for sentinel lymph node biopsy which she underwent on 02/21/2021 and 6 nodes were sampled all of which were negative.  No Oncotype scoring is anticipated and she is seen today to discuss adjuvant radiotherapy.    PREVIOUS RADIATION THERAPY: No   PAST MEDICAL HISTORY:  Past Medical History:  Diagnosis Date   Abnormal Pap smear of cervix 2001   Anxiety    Family history of breast cancer    PCOS (polycystic ovarian syndrome)    STD (sexually transmitted disease)    hsv1       PAST SURGICAL HISTORY: Past Surgical History:  Procedure Laterality Date   BREAST LUMPECTOMY WITH RADIOACTIVE SEED LOCALIZATION Right 01/18/2021   Procedure: RIGHT BREAST LUMPECTOMY WITH RADIOACTIVE SEED LOCALIZATION X 2;  Surgeon: Jovita Kussmaul, MD;  Location: [place];  Service: General;  Laterality: Right;   CERVICAL BIOPSY  W/ LOOP ELECTRODE EXCISION  Age 22 2001   Paps have been normal eversince    COLPOSCOPY     INTRAUTERINE DEVICE (IUD) INSERTION  07/2017   Mirena    SENTINEL NODE BIOPSY Right 02/21/2021   Procedure: RIGHT SENTINEL NODE BIOPSY;  Surgeon: Jovita Kussmaul, MD;  Location: [place];  Service: General;  Laterality: Right;  26 MINUTES ROOM 5     FAMILY HISTORY:  Family History  Problem Relation Age of Onset   Diabetes Mother    Heart attack Paternal Grandfather    Hypertension Father    Breast cancer Paternal Grandmother  dx 74s   Diabetes Maternal Grandmother    Diabetes Maternal Grandfather      SOCIAL HISTORY:  reports that she has never smoked. She has never used smokeless tobacco. She reports that she does not currently use alcohol. She reports current drug use. Drug: Marijuana.  The patient is married and resides in [place].  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 She has two  stepchildren in their teens. They are active with [employer].   ALLERGIES: Other, Penicillins, and Sulfamethoxazole-trimethoprim   MEDICATIONS:  Current Outpatient Medications  Medication Sig Dispense Refill   ALPRAZolam (XANAX) 1 MG tablet Take 1 mg by mouth 3 (three) times daily as needed for anxiety.     clotrimazole-betamethasone (LOTRISONE) cream Apply 1 application topically 2 (two) times daily.     fluticasone (FLONASE) 50 MCG/ACT nasal spray Place 2 sprays into both nostrils daily as needed for allergies or rhinitis.     ibuprofen (ADVIL,MOTRIN) 800 MG tablet Take 1 tablet (800 mg total) by mouth every 8 (eight) hours as needed. 30 tablet 1   loratadine (CLARITIN) 10 MG tablet Take 10 mg by mouth daily.     No current facility-administered medications for this encounter.     REVIEW OF SYSTEMS: On review of systems, the patient reports she has been doing fairly well since our last visit but a lot has taken place with multiple biopsies and surgeries.  She states that she did have significant dermatitis following her first surgery in the upper inner right arm.  She also has several other areas scattered throughout the trunk consistent with the same finding.  She shows me pictures and I am suspicious that this might be contact dermatitis as the seem to respond to antihistamine therapy.  She did have some swelling following her surgery in the axilla and was able to use a compression wedge in her bra to alleviate this in a very short amount of time.  Otherwise she feels like she is healing up fairly well.  No other specific complaints are verbalized.  PHYSICAL EXAM:  Wt Readings from Last 3 Encounters:  03/22/21 148 lb (67.1 kg)  02/21/21 150 lb 5.7 oz (68.2 kg)  01/30/21 152 lb 9.6 oz (69.2 kg)   Temp Readings from Last 3 Encounters:  03/22/21 98.6 F (37 C) (Oral)  02/21/21 98.3 F (36.8 C)  01/30/21 97.7 F (36.5 C) (Temporal)   BP Readings from Last 3 Encounters:   03/22/21 119/79  02/21/21 111/78  01/18/21 106/74   Pulse Readings from Last 3 Encounters:  03/22/21 82  02/21/21 74  01/30/21 82    In general this is a well appearing female in no acute distress. She's alert and oriented x4 and appropriate throughout the examination. Cardiopulmonary assessment is negative for acute distress and she exhibits normal effort.  The right breast reveals a well-healed periareolar incision without cellulitic streaking bleeding or drainage.  Her separate axillary incision is also well-healed.  Mild induration is noted deep to both of these sites without fluctuation.    ECOG = 1  0 - Asymptomatic (Fully active, able to carry on all predisease activities without restriction)  1 - Symptomatic but completely ambulatory (Restricted in physically strenuous activity but ambulatory and able to carry out work of a light or sedentary nature. For example, light housework, office work)  2 - Symptomatic, <50% in bed during the day (Ambulatory and capable of all self care but unable to carry out any work  activities. Up and about more than 50% of waking hours)  3 - Symptomatic, >50% in bed, but not bedbound (Capable of only limited self-care, confined to bed or chair 50% or more of waking hours)  4 - Bedbound (Completely disabled. Cannot carry on any self-care. Totally confined to bed or chair)  5 - Death   Eustace Pen MM, Creech RH, Tormey DC, et al. [phone]). "Toxicity and response criteria of the [facility]". [place] Oncol. 5 (6): 649-55    LABORATORY DATA:  Lab Results  Component Value Date   WBC 5.3 01/11/2021   HGB 14.1 01/11/2021   HCT 41.4 01/11/2021   MCV 97.6 01/11/2021   PLT 322 01/11/2021   Lab Results  Component Value Date   NA 136 01/11/2021   K 4.1 01/11/2021   CL 105 01/11/2021   CO2 24 01/11/2021   Lab Results  Component Value Date   ALT 15 01/11/2021   AST 19 01/11/2021   ALKPHOS 32 (L) 01/11/2021   BILITOT 1.1  01/11/2021      RADIOGRAPHY: NM Sentinel Node Inj-No Rpt (Breast)  Result Date: 02/21/2021 Sulfur Colloid was injected by the Nuclear Medicine Technologist for sentinel lymph node localization.        IMPRESSION/PLAN: 1. 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 Dr. Lisbeth Renshaw discusses the pathology findings and reviews the nature of early stage right breast disease.  The patient has had a prolonged course due to multiple procedures that were needed to clarify her disease status.  Fortunately she seems to be doing well now at this time with healing.  Dr. Lisbeth Renshaw discusses the rationale for adjuvant external radiotherapy to the breast  to reduce risks of local recurrence followed by antiestrogen therapy. We discussed the risks, benefits, short, and long term effects of radiotherapy, as well as the curative intent, and the patient is interested in proceeding. Dr. Lisbeth Renshaw discusses the delivery and logistics of radiotherapy and anticipates a course of 6 1/2 weeks of radiotherapy to the right breast. Written consent is obtained and placed in the chart, a copy was provided to the patient.  She will simulate today as well. 2. Contraceptive Counseling.  The patient's IUD has been removed since her last visit with [place].  She has not been sexually active since her surgery and have a negative pregnancy test prior to this.  No pregnancy testing is necessary at this time but she is aware that she should use condoms during sexual activity if she becomes active during radiotherapy as her husband just had a recent vasectomy  In a visit lasting 60 minutes, greater than 50% of the time was spent face to face reviewing her case, as well as in preparation of, discussing, and coordinating the patient's care.  The above documentation reflects my direct findings during this shared patient visit. Please see the separate note by Dr. Lisbeth Renshaw on this date for the remainder of the patient's plan of  care.    Carola Rhine, [facility]    **Disclaimer: This note was dictated with voice recognition software. Similar sounding words can inadvertently be transcribed and this note may contain transcription errors which may not have been corrected upon publication of note.**

## 2021-03-22 NOTE — Progress Notes (Signed)
Patient denies pain, fatigue, skin changes, swelling, and tenderness. Has good range of motion bilaterally.  Meaningful use questions complete.  BP 119/79 (BP Location: Left Arm, Patient Position: Sitting, Cuff Size: Normal)   Pulse 82   Temp 98.6 F (37 C) (Oral)   Resp 18   Ht '5\' 6"'$  (1.676 m)   Wt 148 lb (67.1 kg)   SpO2 100%   BMI 23.89 kg/m

## 2021-03-28 ENCOUNTER — Encounter: Payer: Self-pay | Admitting: *Deleted

## 2021-03-30 ENCOUNTER — Ambulatory Visit: Payer: 59 | Admitting: Radiation Oncology

## 2021-03-31 ENCOUNTER — Ambulatory Visit: Payer: 59

## 2021-04-02 ENCOUNTER — Ambulatory Visit
Admission: RE | Admit: 2021-04-02 | Discharge: 2021-04-02 | Disposition: A | Discharge status: Home / Self Care | Payer: 59 | Source: Ambulatory Visit | Attending: Radiation Oncology | Admitting: Radiation Oncology

## 2021-04-02 DIAGNOSIS — C50411 Malignant neoplasm of upper-outer quadrant of right female breast: Secondary | ICD-10-CM | POA: Insufficient documentation

## 2021-04-02 DIAGNOSIS — Z170 Estrogen receptor positive status [ER+]: Secondary | ICD-10-CM | POA: Insufficient documentation

## 2021-04-03 ENCOUNTER — Other Ambulatory Visit: Payer: Self-pay

## 2021-04-03 DIAGNOSIS — C50411 Malignant neoplasm of upper-outer quadrant of right female breast: Secondary | ICD-10-CM | POA: Diagnosis not present

## 2021-04-04 ENCOUNTER — Ambulatory Visit
Admission: RE | Admit: 2021-04-04 | Discharge: 2021-04-04 | Disposition: A | Discharge status: Home / Self Care | Payer: 59 | Source: Ambulatory Visit | Attending: Radiation Oncology | Admitting: Radiation Oncology

## 2021-04-04 ENCOUNTER — Encounter: Payer: Self-pay | Admitting: Radiation Oncology

## 2021-04-04 DIAGNOSIS — C50411 Malignant neoplasm of upper-outer quadrant of right female breast: Secondary | ICD-10-CM | POA: Diagnosis not present

## 2021-04-05 ENCOUNTER — Other Ambulatory Visit: Payer: Self-pay

## 2021-04-05 ENCOUNTER — Ambulatory Visit
Admission: RE | Admit: 2021-04-05 | Discharge: 2021-04-05 | Disposition: A | Discharge status: Home / Self Care | Payer: 59 | Source: Ambulatory Visit | Attending: Radiation Oncology | Admitting: Radiation Oncology

## 2021-04-05 DIAGNOSIS — C50411 Malignant neoplasm of upper-outer quadrant of right female breast: Secondary | ICD-10-CM | POA: Diagnosis not present

## 2021-04-06 ENCOUNTER — Ambulatory Visit
Admission: RE | Admit: 2021-04-06 | Discharge: 2021-04-06 | Disposition: A | Discharge status: Home / Self Care | Payer: 59 | Source: Ambulatory Visit | Attending: Radiation Oncology | Admitting: Radiation Oncology

## 2021-04-06 DIAGNOSIS — C50411 Malignant neoplasm of upper-outer quadrant of right female breast: Secondary | ICD-10-CM | POA: Diagnosis not present

## 2021-04-06 NOTE — Progress Notes (Signed)
Pt here for patient teaching.  Pt given Radiation and You booklet, skin care instructions, Alra deodorant, and Radiaplex gel.  Reviewed areas of pertinence such as fatigue, hair loss, skin changes, breast tenderness, and breast swelling . Pt able to give teach back of to pat skin and use unscented/gentle soap,apply Radiaplex bid, avoid applying anything to skin within 4 hours of treatment, avoid wearing an under wire bra, and to use an electric razor if they must shave. Pt verbalizes understanding of information given and will contact nursing with any questions or concerns.     Http://rtanswers.org/treatmentinformation/whattoexpect/index  Giavonni Cizek M. Donney Caraveo RN, BSN       

## 2021-04-07 ENCOUNTER — Ambulatory Visit
Admission: RE | Admit: 2021-04-07 | Discharge: 2021-04-07 | Disposition: A | Discharge status: Home / Self Care | Payer: 59 | Source: Ambulatory Visit | Attending: Radiation Oncology | Admitting: Radiation Oncology

## 2021-04-07 ENCOUNTER — Encounter: Payer: Self-pay | Admitting: Radiation Oncology

## 2021-04-07 ENCOUNTER — Other Ambulatory Visit: Payer: Self-pay

## 2021-04-07 DIAGNOSIS — Z170 Estrogen receptor positive status [ER+]: Secondary | ICD-10-CM

## 2021-04-07 DIAGNOSIS — C50411 Malignant neoplasm of upper-outer quadrant of right female breast: Secondary | ICD-10-CM

## 2021-04-07 MED ORDER — ALRA NON-METALLIC DEODORANT (RAD-ONC)
1.0000 "application " | Freq: Once | TOPICAL | Status: AC
  Administered 2021-04-07: 1 via TOPICAL

## 2021-04-07 MED ORDER — RADIAPLEXRX EX GEL: Freq: Once | CUTANEOUS | Status: AC

## 2021-04-10 ENCOUNTER — Ambulatory Visit
Admission: RE | Admit: 2021-04-10 | Discharge: 2021-04-10 | Disposition: A | Discharge status: Home / Self Care | Payer: 59 | Source: Ambulatory Visit | Attending: Radiation Oncology | Admitting: Radiation Oncology

## 2021-04-10 ENCOUNTER — Other Ambulatory Visit: Payer: Self-pay

## 2021-04-10 DIAGNOSIS — C50411 Malignant neoplasm of upper-outer quadrant of right female breast: Secondary | ICD-10-CM | POA: Diagnosis not present

## 2021-04-11 ENCOUNTER — Ambulatory Visit
Admission: RE | Admit: 2021-04-11 | Discharge: 2021-04-11 | Disposition: A | Discharge status: Home / Self Care | Payer: 59 | Source: Ambulatory Visit | Attending: Radiation Oncology | Admitting: Radiation Oncology

## 2021-04-11 DIAGNOSIS — C50411 Malignant neoplasm of upper-outer quadrant of right female breast: Secondary | ICD-10-CM | POA: Diagnosis not present

## 2021-04-12 ENCOUNTER — Ambulatory Visit
Admission: RE | Admit: 2021-04-12 | Discharge: 2021-04-12 | Disposition: A | Discharge status: Home / Self Care | Payer: 59 | Source: Ambulatory Visit | Attending: Radiation Oncology | Admitting: Radiation Oncology

## 2021-04-12 ENCOUNTER — Other Ambulatory Visit: Payer: Self-pay

## 2021-04-12 DIAGNOSIS — C50411 Malignant neoplasm of upper-outer quadrant of right female breast: Secondary | ICD-10-CM | POA: Diagnosis not present

## 2021-04-13 ENCOUNTER — Ambulatory Visit
Admission: RE | Admit: 2021-04-13 | Discharge: 2021-04-13 | Disposition: A | Discharge status: Home / Self Care | Payer: 59 | Source: Ambulatory Visit | Attending: Radiation Oncology | Admitting: Radiation Oncology

## 2021-04-13 DIAGNOSIS — C50411 Malignant neoplasm of upper-outer quadrant of right female breast: Secondary | ICD-10-CM | POA: Diagnosis not present

## 2021-04-14 ENCOUNTER — Other Ambulatory Visit: Payer: Self-pay

## 2021-04-14 ENCOUNTER — Ambulatory Visit
Admission: RE | Admit: 2021-04-14 | Discharge: 2021-04-14 | Disposition: A | Discharge status: Home / Self Care | Payer: 59 | Source: Ambulatory Visit | Attending: Radiation Oncology | Admitting: Radiation Oncology

## 2021-04-14 DIAGNOSIS — C50411 Malignant neoplasm of upper-outer quadrant of right female breast: Secondary | ICD-10-CM | POA: Diagnosis not present

## 2021-04-17 ENCOUNTER — Ambulatory Visit
Admission: RE | Admit: 2021-04-17 | Discharge: 2021-04-17 | Disposition: A | Discharge status: Home / Self Care | Payer: 59 | Source: Ambulatory Visit | Attending: Radiation Oncology | Admitting: Radiation Oncology

## 2021-04-17 DIAGNOSIS — C50411 Malignant neoplasm of upper-outer quadrant of right female breast: Secondary | ICD-10-CM | POA: Diagnosis not present

## 2021-04-18 ENCOUNTER — Ambulatory Visit
Admission: RE | Admit: 2021-04-18 | Discharge: 2021-04-18 | Disposition: A | Discharge status: Home / Self Care | Payer: 59 | Source: Ambulatory Visit | Attending: Radiation Oncology | Admitting: Radiation Oncology

## 2021-04-18 ENCOUNTER — Other Ambulatory Visit: Payer: Self-pay

## 2021-04-18 DIAGNOSIS — C50411 Malignant neoplasm of upper-outer quadrant of right female breast: Secondary | ICD-10-CM | POA: Diagnosis not present

## 2021-04-19 ENCOUNTER — Ambulatory Visit
Admission: RE | Admit: 2021-04-19 | Discharge: 2021-04-19 | Disposition: A | Discharge status: Home / Self Care | Payer: 59 | Source: Ambulatory Visit | Attending: Radiation Oncology | Admitting: Radiation Oncology

## 2021-04-19 DIAGNOSIS — C50411 Malignant neoplasm of upper-outer quadrant of right female breast: Secondary | ICD-10-CM | POA: Diagnosis not present

## 2021-04-20 ENCOUNTER — Ambulatory Visit
Admission: RE | Admit: 2021-04-20 | Discharge: 2021-04-20 | Disposition: A | Discharge status: Home / Self Care | Payer: 59 | Source: Ambulatory Visit | Attending: Radiation Oncology | Admitting: Radiation Oncology

## 2021-04-20 ENCOUNTER — Other Ambulatory Visit: Payer: Self-pay

## 2021-04-20 DIAGNOSIS — C50411 Malignant neoplasm of upper-outer quadrant of right female breast: Secondary | ICD-10-CM | POA: Diagnosis not present

## 2021-04-21 ENCOUNTER — Ambulatory Visit
Admission: RE | Admit: 2021-04-21 | Discharge: 2021-04-21 | Disposition: A | Discharge status: Home / Self Care | Payer: 59 | Source: Ambulatory Visit | Attending: Radiation Oncology | Admitting: Radiation Oncology

## 2021-04-21 DIAGNOSIS — C50411 Malignant neoplasm of upper-outer quadrant of right female breast: Secondary | ICD-10-CM | POA: Diagnosis not present

## 2021-04-24 ENCOUNTER — Ambulatory Visit: Payer: 59

## 2021-04-24 ENCOUNTER — Other Ambulatory Visit: Payer: Self-pay

## 2021-04-24 ENCOUNTER — Ambulatory Visit
Admission: RE | Admit: 2021-04-24 | Discharge: 2021-04-24 | Disposition: A | Discharge status: Home / Self Care | Payer: 59 | Source: Ambulatory Visit | Attending: Radiation Oncology | Admitting: Radiation Oncology

## 2021-04-24 DIAGNOSIS — Z510 Encounter for antineoplastic radiation therapy: Secondary | ICD-10-CM | POA: Insufficient documentation

## 2021-04-24 DIAGNOSIS — C50411 Malignant neoplasm of upper-outer quadrant of right female breast: Secondary | ICD-10-CM | POA: Insufficient documentation

## 2021-04-24 DIAGNOSIS — Z170 Estrogen receptor positive status [ER+]: Secondary | ICD-10-CM | POA: Insufficient documentation

## 2021-04-24 DIAGNOSIS — Z79810 Long term (current) use of selective estrogen receptor modulators (SERMs): Secondary | ICD-10-CM | POA: Diagnosis not present

## 2021-04-25 ENCOUNTER — Ambulatory Visit
Admission: RE | Admit: 2021-04-25 | Discharge: 2021-04-25 | Disposition: A | Discharge status: Home / Self Care | Payer: 59 | Source: Ambulatory Visit | Attending: Radiation Oncology | Admitting: Radiation Oncology

## 2021-04-25 DIAGNOSIS — Z510 Encounter for antineoplastic radiation therapy: Secondary | ICD-10-CM | POA: Diagnosis not present

## 2021-04-26 ENCOUNTER — Other Ambulatory Visit: Payer: Self-pay

## 2021-04-26 ENCOUNTER — Ambulatory Visit
Admission: RE | Admit: 2021-04-26 | Discharge: 2021-04-26 | Disposition: A | Discharge status: Home / Self Care | Payer: 59 | Source: Ambulatory Visit | Attending: Radiation Oncology | Admitting: Radiation Oncology

## 2021-04-26 DIAGNOSIS — Z510 Encounter for antineoplastic radiation therapy: Secondary | ICD-10-CM | POA: Diagnosis not present

## 2021-04-27 ENCOUNTER — Ambulatory Visit
Admission: RE | Admit: 2021-04-27 | Discharge: 2021-04-27 | Disposition: A | Discharge status: Home / Self Care | Payer: 59 | Source: Ambulatory Visit | Attending: Radiation Oncology | Admitting: Radiation Oncology

## 2021-04-27 DIAGNOSIS — Z510 Encounter for antineoplastic radiation therapy: Secondary | ICD-10-CM | POA: Diagnosis not present

## 2021-04-28 ENCOUNTER — Other Ambulatory Visit: Payer: Self-pay

## 2021-04-28 ENCOUNTER — Ambulatory Visit
Admission: RE | Admit: 2021-04-28 | Discharge: 2021-04-28 | Disposition: A | Discharge status: Home / Self Care | Payer: 59 | Source: Ambulatory Visit | Attending: Radiation Oncology | Admitting: Radiation Oncology

## 2021-04-28 DIAGNOSIS — Z510 Encounter for antineoplastic radiation therapy: Secondary | ICD-10-CM | POA: Diagnosis not present

## 2021-05-01 ENCOUNTER — Other Ambulatory Visit: Payer: Self-pay

## 2021-05-01 ENCOUNTER — Ambulatory Visit
Admission: RE | Admit: 2021-05-01 | Discharge: 2021-05-01 | Disposition: A | Discharge status: Home / Self Care | Payer: 59 | Source: Ambulatory Visit | Attending: Radiation Oncology | Admitting: Radiation Oncology

## 2021-05-01 DIAGNOSIS — Z510 Encounter for antineoplastic radiation therapy: Secondary | ICD-10-CM | POA: Diagnosis not present

## 2021-05-02 ENCOUNTER — Ambulatory Visit
Admission: RE | Admit: 2021-05-02 | Discharge: 2021-05-02 | Disposition: A | Discharge status: Home / Self Care | Payer: 59 | Source: Ambulatory Visit | Attending: Radiation Oncology | Admitting: Radiation Oncology

## 2021-05-02 DIAGNOSIS — Z510 Encounter for antineoplastic radiation therapy: Secondary | ICD-10-CM | POA: Diagnosis not present

## 2021-05-03 ENCOUNTER — Other Ambulatory Visit: Payer: Self-pay

## 2021-05-03 ENCOUNTER — Ambulatory Visit
Admission: RE | Admit: 2021-05-03 | Discharge: 2021-05-03 | Disposition: A | Discharge status: Home / Self Care | Payer: 59 | Source: Ambulatory Visit | Attending: Radiation Oncology | Admitting: Radiation Oncology

## 2021-05-03 DIAGNOSIS — Z510 Encounter for antineoplastic radiation therapy: Secondary | ICD-10-CM | POA: Diagnosis not present

## 2021-05-04 ENCOUNTER — Ambulatory Visit
Admission: RE | Admit: 2021-05-04 | Discharge: 2021-05-04 | Disposition: A | Discharge status: Home / Self Care | Payer: 59 | Source: Ambulatory Visit | Attending: Radiation Oncology | Admitting: Radiation Oncology

## 2021-05-04 DIAGNOSIS — Z510 Encounter for antineoplastic radiation therapy: Secondary | ICD-10-CM | POA: Diagnosis not present

## 2021-05-05 ENCOUNTER — Other Ambulatory Visit: Payer: Self-pay

## 2021-05-05 ENCOUNTER — Ambulatory Visit: Payer: 59 | Admitting: Radiation Oncology

## 2021-05-05 ENCOUNTER — Ambulatory Visit
Admission: RE | Admit: 2021-05-05 | Discharge: 2021-05-05 | Disposition: A | Discharge status: Home / Self Care | Payer: 59 | Source: Ambulatory Visit | Attending: Radiation Oncology | Admitting: Radiation Oncology

## 2021-05-05 DIAGNOSIS — Z510 Encounter for antineoplastic radiation therapy: Secondary | ICD-10-CM | POA: Diagnosis not present

## 2021-05-05 DIAGNOSIS — C50411 Malignant neoplasm of upper-outer quadrant of right female breast: Secondary | ICD-10-CM

## 2021-05-05 DIAGNOSIS — Z170 Estrogen receptor positive status [ER+]: Secondary | ICD-10-CM

## 2021-05-05 MED ORDER — RADIAPLEXRX EX GEL: Freq: Once | CUTANEOUS | Status: AC

## 2021-05-06 NOTE — Progress Notes (Addendum)
 Patient Care Team: Ramachandran, Ajith, MD as PCP - General (Internal Medicine) Stuart, Dawn C, RN as Oncology Nurse Navigator Martini, Keisha N, RN as Oncology Nurse Navigator Toth, Paul III, MD as Consulting Physician (General Surgery) Gudena, Vinay, MD as Consulting Physician (Hematology and Oncology) Moody, John, MD as Consulting Physician (Radiation Oncology)  DIAGNOSIS:    ICD-10-CM   1. Malignant neoplasm of upper-outer quadrant of right breast in female, estrogen receptor positive (HCC)  C50.411    Z17.0       SUMMARY OF ONCOLOGIC HISTORY: Oncology History  Malignant neoplasm of upper outer quadrant of female breast (HCC)  12/09/2020 Initial Diagnosis   Screening mammogram showed right breast calcifications. Diagnostic mammogram and US showed 3 groups of calcifications in the right breast, largest 1.4cm, with two smaller measuring 0.1cm. 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 Testing was completed through the BRCAplus and CancerNext-Expanded + RNAinsight panels offered by [facility] laboratories. The report date is 12/28/2020.   The BRCAplus panel offered by [facility] and includes sequencing and deletion/duplication analysis for the following 8 genes: ATM, BRCA1, BRCA2, CDH1, CHEK2, PALB2, PTEN, and TP53. The CancerNext-Expanded + RNAinsight gene panel offered by [facility] and includes sequencing and rearrangement analysis for the following 77 genes: AIP, ALK, APC, ATM, AXIN2, BAP1, BARD1, BLM, BMPR1A, BRCA1,  BRCA2, BRIP1, CDC73, CDH1, CDK4, CDKN1B, CDKN2A, CHEK2, CTNNA1, DICER1, FANCC, FH, FLCN, GALNT12, KIF1B, LZTR1, MAX, MEN1, MET, MLH1, MSH2, MSH3, MSH6, MUTYH, NBN, NF1, NF2, NTHL1, PALB2, PHOX2B, PMS2, POT1, PRKAR1A, PTCH1, PTEN, RAD51C, RAD51D, RB1, RECQL, RET, SDHA, SDHAF2, SDHB, SDHC, SDHD, SMAD4, SMARCA4, SMARCB1, SMARCE1, STK11, SUFU, TMEM127, TP53, TSC1, TSC2, VHL and XRCC2 (sequencing and deletion/duplication); EGFR, EGLN1, HOXB13, KIT, MITF, PDGFRA, POLD1 and POLE (sequencing only); EPCAM and GREM1 (deletion/duplication only). RNA data is routinely analyzed for use in variant interpretation for all genes.   01/18/2021 Surgery   Right lumpectomy: Incidental focus of IDC grade 1, 0.2 cm, foci of DCIS intermediate grade with focal necrosis, margins negative, ER 60%, PR 80% T1 a N0 stage Ia     CHIEF COMPLIANT: Follow-up towards end of radiation  INTERVAL HISTORY: Ann Buckley is a 40 y.o. with above-mentioned history of right breast DCIS having undergone right lumpectomy, currently on radiation therapy. She presents to the clinic today for follow-up.  She is tolerating radiation reasonably well.  ALLERGIES:  is allergic to other, penicillins, and sulfamethoxazole-trimethoprim.  MEDICATIONS:  Current Outpatient Medications  Medication Sig Dispense Refill   ALPRAZolam (XANAX) 1 MG tablet Take 1 mg by mouth 3 (three) times daily as needed for anxiety.     clotrimazole-betamethasone (LOTRISONE) cream Apply 1 application topically 2 (two) times daily.     fluticasone (FLONASE) 50 MCG/ACT nasal spray Place 2 sprays into both nostrils daily as needed for allergies or rhinitis.     ibuprofen (ADVIL,MOTRIN) 800 MG tablet Take 1 tablet (800 mg total) by mouth every 8 (eight) hours as needed. 30 tablet 1   loratadine (CLARITIN) 10 MG tablet Take 10 mg by mouth daily.       No current facility-administered medications for this visit.    PHYSICAL EXAMINATION: ECOG PERFORMANCE STATUS: 1  - Symptomatic but completely ambulatory  Vitals:   05/08/21 0936  BP: 103/71  Pulse: 76  Resp: 16  Temp: 97.9 F (36.6 C)  SpO2: 100%   Filed Weights   05/08/21 0936  Weight: 153 lb 3.2 oz (69.5 kg)      LABORATORY DATA:  I have reviewed the data as listed CMP Latest Ref Rng & Units 01/11/2021 12/14/2020  Glucose 70 - 99 mg/dL 84 130(H)  BUN 6 - 20 mg/dL 9 10  Creatinine 0.44 - 1.00 mg/dL 0.77 0.76  Sodium 135 - 145 mmol/L 136 140  Potassium 3.5 - 5.1 mmol/L 4.1 3.9  Chloride 98 - 111 mmol/L 105 106  CO2 22 - 32 mmol/L 24 26  Calcium 8.9 - 10.3 mg/dL 9.6 9.5  Total Protein 6.5 - 8.1 g/dL 7.6 7.4  Total Bilirubin 0.3 - 1.2 mg/dL 1.1 0.6  Alkaline Phos 38 - 126 U/L 32(L) 36(L)  AST 15 - 41 U/L 19 16  ALT 0 - 44 U/L 15 10    Lab Results  Component Value Date   WBC 5.3 01/11/2021   HGB 14.1 01/11/2021   HCT 41.4 01/11/2021   MCV 97.6 01/11/2021   PLT 322 01/11/2021   NEUTROABS 4.5 12/14/2020    ASSESSMENT & PLAN:  Malignant neoplasm of upper outer quadrant of female breast ([place]) 12/09/2020:Screening mammogram showed right breast calcifications. Diagnostic mammogram and US showed 3 groups of calcifications in the right breast, largest 1.4cm, with two smaller measuring 0.1cm. 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   Treatment plan: 1. Breast conserving surgery Right lumpectomy: Incidental focus of IDC grade 1, 0.2 cm, foci of DCIS intermediate grade with focal necrosis, margins negative, ER 60%, PR 80%, Ki-67 2%, HER2 negative T1 a N0 stage Ia 2. sentinel lymph node study 02/21/2021: 0/5 lymph nodes negative 3. Followed by adjuvant radiation therapy 04/04/2021-05/17/2021 4. Followed by antiestrogen therapy with tamoxifen 5-10 years ------------------------------------------------------------------------------------------------------------------------------------------ Tamoxifen counseling: We discussed the risks and benefits of tamoxifen. These include  but not limited to insomnia, hot flashes, mood changes, vaginal dryness, and weight gain. Although rare, serious side effects including endometrial cancer, risk of blood clots were also discussed. We strongly believe that the benefits far outweigh the risks. Patient understands these risks and consented to starting treatment. Planned treatment duration is 5-10 years.  Return to clinic in 3 months for survivorship care plan visit  No orders of the defined types were placed in this encounter.  The patient has a good understanding of the overall plan. she agrees with it. she will call with any problems that may develop before the next visit here.  Total time spent: 30 mins including face to face time and time spent for planning, charting and coordination of care  Rulon Eisenmenger, MD, MPH 05/08/2021  I, Thana Ates, am acting as scribe for Dr. Nicholas Lose.  I have reviewed the above documentation for accuracy and completeness, and I agree with the above.

## 2021-05-08 ENCOUNTER — Other Ambulatory Visit: Payer: Self-pay

## 2021-05-08 ENCOUNTER — Encounter: Payer: Self-pay | Admitting: Hematology and Oncology

## 2021-05-08 ENCOUNTER — Inpatient Hospital Stay: Payer: 59 | Attending: Hematology and Oncology | Admitting: Hematology and Oncology

## 2021-05-08 ENCOUNTER — Encounter: Payer: Self-pay | Admitting: *Deleted

## 2021-05-08 ENCOUNTER — Ambulatory Visit
Admission: RE | Admit: 2021-05-08 | Discharge: 2021-05-08 | Disposition: A | Discharge status: Home / Self Care | Payer: 59 | Source: Ambulatory Visit | Attending: Radiation Oncology | Admitting: Radiation Oncology

## 2021-05-08 DIAGNOSIS — Z170 Estrogen receptor positive status [ER+]: Secondary | ICD-10-CM | POA: Diagnosis not present

## 2021-05-08 DIAGNOSIS — D0511 Intraductal carcinoma in situ of right breast: Secondary | ICD-10-CM | POA: Insufficient documentation

## 2021-05-08 DIAGNOSIS — Z79810 Long term (current) use of selective estrogen receptor modulators (SERMs): Secondary | ICD-10-CM | POA: Insufficient documentation

## 2021-05-08 DIAGNOSIS — Z510 Encounter for antineoplastic radiation therapy: Secondary | ICD-10-CM | POA: Diagnosis not present

## 2021-05-08 DIAGNOSIS — C50411 Malignant neoplasm of upper-outer quadrant of right female breast: Secondary | ICD-10-CM | POA: Diagnosis not present

## 2021-05-08 MED ORDER — TAMOXIFEN CITRATE 20 MG PO TABS: 20.0000 mg | ORAL_TABLET | Freq: Every day | ORAL | 3 refills | Status: DC

## 2021-05-08 NOTE — Assessment & Plan Note (Signed)
12/09/2020:Screening mammogram showed right breast calcifications. Diagnostic mammogram and US showed 3 groups of calcifications in the right breast, largest 1.4cm, with two smaller measuring 0.1cm. 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  Treatment plan: 1. Breast conserving surgeryRight lumpectomy: Incidental focus of IDC grade 1, 0.2 cm, foci of DCIS intermediate grade with focal necrosis, margins negative, ER 60%, PR 80%, Ki-67 2%, HER2 negative T1 a N0 stage Ia 2. sentinel lymph node study 02/21/2021: 0/5 lymph nodes negative 3. Followed by adjuvant radiation therapy 04/04/2021-05/17/2021 4. Followed by antiestrogen therapy with tamoxifen 5 years ------------------------------------------------------------------------------------------------------------------------------------------ Tamoxifen counseling: We discussed the risks and benefits of tamoxifen. These include but not limited to insomnia, hot flashes, mood changes, vaginal dryness, and weight gain. Although rare, serious side effects including endometrial cancer, risk of blood clots were also discussed. We strongly believe that the benefits far outweigh the risks. Patient understands these risks and consented to starting treatment. Planned treatment duration is 10 years.  Return to clinic in 3 months for survivorship care plan visit

## 2021-05-09 ENCOUNTER — Ambulatory Visit
Admission: RE | Admit: 2021-05-09 | Discharge: 2021-05-09 | Disposition: A | Discharge status: Home / Self Care | Payer: 59 | Source: Ambulatory Visit | Attending: Radiation Oncology | Admitting: Radiation Oncology

## 2021-05-09 ENCOUNTER — Ambulatory Visit: Payer: 59

## 2021-05-09 DIAGNOSIS — Z510 Encounter for antineoplastic radiation therapy: Secondary | ICD-10-CM | POA: Diagnosis not present

## 2021-05-10 ENCOUNTER — Other Ambulatory Visit: Payer: Self-pay

## 2021-05-10 ENCOUNTER — Ambulatory Visit
Admission: RE | Admit: 2021-05-10 | Discharge: 2021-05-10 | Disposition: A | Discharge status: Home / Self Care | Payer: 59 | Source: Ambulatory Visit | Attending: Radiation Oncology | Admitting: Radiation Oncology

## 2021-05-10 ENCOUNTER — Encounter: Payer: Self-pay | Admitting: Hematology and Oncology

## 2021-05-10 ENCOUNTER — Ambulatory Visit: Payer: 59

## 2021-05-10 DIAGNOSIS — Z510 Encounter for antineoplastic radiation therapy: Secondary | ICD-10-CM | POA: Diagnosis not present

## 2021-05-11 ENCOUNTER — Ambulatory Visit
Admission: RE | Admit: 2021-05-11 | Discharge: 2021-05-11 | Disposition: A | Discharge status: Home / Self Care | Payer: 59 | Source: Ambulatory Visit | Attending: Radiation Oncology | Admitting: Radiation Oncology

## 2021-05-11 ENCOUNTER — Ambulatory Visit: Payer: 59

## 2021-05-11 DIAGNOSIS — Z510 Encounter for antineoplastic radiation therapy: Secondary | ICD-10-CM | POA: Diagnosis not present

## 2021-05-12 ENCOUNTER — Ambulatory Visit: Payer: 59

## 2021-05-12 ENCOUNTER — Ambulatory Visit
Admission: RE | Admit: 2021-05-12 | Discharge: 2021-05-12 | Disposition: A | Discharge status: Home / Self Care | Payer: 59 | Source: Ambulatory Visit | Attending: Radiation Oncology | Admitting: Radiation Oncology

## 2021-05-12 ENCOUNTER — Other Ambulatory Visit: Payer: Self-pay

## 2021-05-12 DIAGNOSIS — Z510 Encounter for antineoplastic radiation therapy: Secondary | ICD-10-CM | POA: Diagnosis not present

## 2021-05-15 ENCOUNTER — Encounter: Payer: Self-pay | Admitting: *Deleted

## 2021-05-15 ENCOUNTER — Other Ambulatory Visit: Payer: Self-pay

## 2021-05-15 ENCOUNTER — Ambulatory Visit
Admission: RE | Admit: 2021-05-15 | Discharge: 2021-05-15 | Disposition: A | Discharge status: Home / Self Care | Payer: 59 | Source: Ambulatory Visit | Attending: Radiation Oncology | Admitting: Radiation Oncology

## 2021-05-15 ENCOUNTER — Ambulatory Visit: Payer: 59

## 2021-05-15 DIAGNOSIS — Z510 Encounter for antineoplastic radiation therapy: Secondary | ICD-10-CM | POA: Diagnosis not present

## 2021-05-15 DIAGNOSIS — C50411 Malignant neoplasm of upper-outer quadrant of right female breast: Secondary | ICD-10-CM

## 2021-05-16 ENCOUNTER — Ambulatory Visit
Admission: RE | Admit: 2021-05-16 | Discharge: 2021-05-16 | Disposition: A | Discharge status: Home / Self Care | Payer: 59 | Source: Ambulatory Visit | Attending: Radiation Oncology | Admitting: Radiation Oncology

## 2021-05-16 ENCOUNTER — Ambulatory Visit: Payer: 59

## 2021-05-16 DIAGNOSIS — Z510 Encounter for antineoplastic radiation therapy: Secondary | ICD-10-CM | POA: Diagnosis not present

## 2021-05-17 ENCOUNTER — Other Ambulatory Visit: Payer: Self-pay

## 2021-05-17 ENCOUNTER — Ambulatory Visit
Admission: RE | Admit: 2021-05-17 | Discharge: 2021-05-17 | Disposition: A | Discharge status: Home / Self Care | Payer: 59 | Source: Ambulatory Visit | Attending: Radiation Oncology | Admitting: Radiation Oncology

## 2021-05-17 ENCOUNTER — Encounter: Payer: Self-pay | Admitting: Radiation Oncology

## 2021-05-17 ENCOUNTER — Ambulatory Visit: Payer: 59

## 2021-05-17 DIAGNOSIS — Z510 Encounter for antineoplastic radiation therapy: Secondary | ICD-10-CM | POA: Diagnosis not present

## 2021-05-18 ENCOUNTER — Ambulatory Visit: Payer: 59

## 2021-05-22 ENCOUNTER — Ambulatory Visit: Payer: 59 | Attending: General Surgery | Admitting: Physical Therapy

## 2021-05-22 ENCOUNTER — Other Ambulatory Visit: Payer: Self-pay

## 2021-05-22 DIAGNOSIS — Z483 Aftercare following surgery for neoplasm: Secondary | ICD-10-CM | POA: Diagnosis not present

## 2021-05-22 NOTE — Therapy (Signed)
[place] @ [address] [place], [place], [address] Phone: [phone]   Fax:  [phone]  Physical Therapy Treatment  Patient Details  Name: Ann Buckley MRN: [id number] Date of Birth: [birth year] Referring Provider (PT): Dr. Marlou Buckley   Encounter Date: 05/22/2021   PT End of Session - 05/22/21 0945     Visit Number 2    Number of Visits 2    PT Start Time 0935    PT Stop Time 0945    PT Time Calculation (min) 10 min    Activity Tolerance Patient tolerated treatment well    Behavior During Therapy [facility] for tasks assessed/performed             Past Medical History:  Diagnosis Date   Abnormal Pap smear of cervix 2001   Anxiety    Family history of breast cancer    PCOS (polycystic ovarian syndrome)    STD (sexually transmitted disease)    hsv1    Past Surgical History:  Procedure Laterality Date   BREAST LUMPECTOMY WITH RADIOACTIVE SEED LOCALIZATION Right 01/18/2021   Procedure: RIGHT BREAST LUMPECTOMY WITH RADIOACTIVE SEED LOCALIZATION X 2;  Surgeon: Ann Kussmaul, MD;  Location: [place];  Service: General;  Laterality: Right;   CERVICAL BIOPSY  W/ LOOP ELECTRODE EXCISION  Age 19 2001   Paps have been normal eversince    COLPOSCOPY     INTRAUTERINE DEVICE (IUD) INSERTION  07/2017   Mirena    SENTINEL NODE BIOPSY Right 02/21/2021   Procedure: RIGHT SENTINEL NODE BIOPSY;  Surgeon: Ann Kussmaul, MD;  Location: [place];  Service: General;  Laterality: Right;  60 MINUTES ROOM 5    There were no vitals filed for this visit.   Subjective Assessment - 05/22/21 0944     Subjective Pt is here for her follow up SOZO screen; no complaints except fatigue    Pertinent History Screening mammogram showed right breast calcifications. Diagnostic mammogram and US showed 3 groups of calcifications in the right breast, largest 1.4cm, with two smaller measuring 0.1cm. Biopsy showed intermediate grade  DCIS, ER+ 60%, PR+ 80%, Her 2- with Ki67 of 2%.  She had a right lumpectomy performed on 12/29/20 and is pending a SLNB on 02/21/2021.  She also had a positive genetic test.She will have adjuvant radiation and anti estrogens                    L-DEX FLOWSHEETS - 05/22/21 0900       L-DEX LYMPHEDEMA SCREENING   Measurement Type Unilateral    L-DEX MEASUREMENT EXTREMITY Upper Extremity    POSITION  Standing    DOMINANT SIDE Right    At Risk Side Right    BASELINE SCORE (UNILATERAL) -1.7    L-DEX SCORE (UNILATERAL) -2.8    VALUE CHANGE (UNILAT) -1.1                                     PT Long Term Goals - 03/09/21 1333       PT LONG TERM GOAL #1   Title Pt will have full post op Right shoulder ROM to resume all usual home activities    Time 6    Period Weeks    Status Achieved    Target Date 03/09/21  Plan - 05/22/21 0945     Clinical Impression Statement Patient's score is -1.1 change from baseline. No concerns.    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

## 2021-05-31 NOTE — Progress Notes (Signed)
                                                                                                                                                             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  Intent: Curative  Radiation Treatment Dates: 04/03/2021 through 05/17/2021 Site Technique Total Dose (Gy) Dose per Fx (Gy) Completed Fx Beam Energies  Breast, Right: Breast_Rt 3D 50.4/50.4 1.8 28/28 6X  Breast, Right: Breast_Rt_Bst 3D 10/10 2 5/5 6X   Narrative: The patient tolerated radiation therapy relatively well. She developed dry desquamation changes in the treatment field.   Plan: The patient will receive a call in about one month from the radiation oncology department. She will continue follow up with Dr. Lindi Adie as well.   ________________________________________________    Carola Rhine, [facility]

## 2021-06-05 ENCOUNTER — Ambulatory Visit: Payer: 59

## 2021-06-10 ENCOUNTER — Encounter: Payer: Self-pay | Admitting: Hematology and Oncology

## 2021-06-12 ENCOUNTER — Ambulatory Visit
Admission: RE | Admit: 2021-06-12 | Discharge: 2021-06-12 | Disposition: A | Discharge status: Home / Self Care | Payer: 59 | Source: Ambulatory Visit | Attending: Hematology and Oncology | Admitting: Hematology and Oncology

## 2021-06-12 DIAGNOSIS — C50411 Malignant neoplasm of upper-outer quadrant of right female breast: Secondary | ICD-10-CM | POA: Insufficient documentation

## 2021-06-12 DIAGNOSIS — Z170 Estrogen receptor positive status [ER+]: Secondary | ICD-10-CM | POA: Insufficient documentation

## 2021-06-13 NOTE — Progress Notes (Signed)
  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  Interval Since Last Radiation:  4 weeks   04/03/2021 through 05/17/2021 Site Technique Total Dose (Gy) Dose per Fx (Gy) Completed Fx Beam Energies  Breast, Right: Breast_Rt 3D 50.4/50.4 1.8 28/28 6X  Breast, Right: Breast_Rt_Bst 3D 10/10 2 5/5 6X    Narrative:  The patient was contacted today for routine follow-up. During treatment she did very well with radiotherapy and did not have moist desquamation but did have some dry peeling at the conclusion of therapy.   Impression/Plan: 1. 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 I was unable to reach the patient but left a voicemail and on the message, and encouraged her to call back if she had concerns since her last visit.     Carola Rhine, PAC

## 2021-06-23 ENCOUNTER — Encounter: Payer: Self-pay | Admitting: Hematology and Oncology

## 2021-06-28 ENCOUNTER — Other Ambulatory Visit (HOSPITAL_COMMUNITY)
Admission: RE | Admit: 2021-06-28 | Discharge: 2021-06-28 | Disposition: A | Discharge status: Home / Self Care | Payer: 59 | Source: Ambulatory Visit | Attending: Nurse Practitioner | Admitting: Nurse Practitioner

## 2021-06-28 ENCOUNTER — Ambulatory Visit (INDEPENDENT_AMBULATORY_CARE_PROVIDER_SITE_OTHER): Payer: 59 | Admitting: Nurse Practitioner

## 2021-06-28 ENCOUNTER — Other Ambulatory Visit: Payer: Self-pay

## 2021-06-28 ENCOUNTER — Encounter: Payer: Self-pay | Admitting: Nurse Practitioner

## 2021-06-28 VITALS — BP 102/72 | HR 82 | Ht 65.5 in | Wt 150.0 lb

## 2021-06-28 DIAGNOSIS — Z170 Estrogen receptor positive status [ER+]: Secondary | ICD-10-CM

## 2021-06-28 DIAGNOSIS — C50411 Malignant neoplasm of upper-outer quadrant of right female breast: Secondary | ICD-10-CM | POA: Diagnosis not present

## 2021-06-28 DIAGNOSIS — Z01419 Encounter for gynecological examination (general) (routine) without abnormal findings: Secondary | ICD-10-CM | POA: Diagnosis not present

## 2021-06-28 NOTE — Progress Notes (Signed)
   Ann Buckley May 15, 1981 [id number]   History:  40 y.o. G0 presents for annual exam. Monthly cycles. 2001 LEEP, subsequent paps normal. History of PCOS. 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 She has been experiencing some vaginal dryness but overall is tolerating well. Mirena IUD removed at time of diagnosis, husband has had vasectomy since removal.   Gynecologic History Patient's last menstrual period was 05/30/2021 (exact date). Period Cycle (Days):  (28-30) Period Duration (Days): 4-5 Period Pattern: Regular Menstrual Flow:  (varies) Menstrual Control: Panty liner, Tampon Dysmenorrhea: (!) Mild (not usually) Contraception/Family planning: vasectomy Sexually active: Yes  Health Maintenance Last Pap: 06/10/2018. Results were: Normal Last mammogram: 10/31/2020. Results were: Right DCIS Last colonoscopy: Not indicated Last Dexa: Not indicated  Past medical history, past surgical history, family history and social history were all reviewed and documented in the EPIC chart. Married. HR director.   ROS:  A ROS was performed and pertinent positives and negatives are included.  Exam:  Vitals:   06/28/21 1532  BP: 102/72  Pulse: 82  SpO2: 98%  Weight: 150 lb (68 kg)  Height: 5' 5.5" (1.664 m)   Body mass index is 24.58 kg/m.  General appearance:  Normal Thyroid:  Symmetrical, normal in size, without palpable masses or nodularity. Respiratory  Auscultation:  Clear without wheezing or rhonchi Cardiovascular  Auscultation:  Regular rate, without rubs, murmurs or gallops  Edema/varicosities:  Not grossly evident Abdominal  Soft,nontender, without masses, guarding or rebound.  Liver/spleen:  No organomegaly noted  Hernia:  None appreciated  Skin  Inspection:  Grossly normal Breasts: Examined lying and sitting.   Right: Without masses, retractions, nipple discharge  or axillary adenopathy. Well-healed lumpectomy scar.    Left: Without masses, retractions, nipple discharge or axillary adenopathy. Genitourinary   Inguinal/mons:  Normal without inguinal adenopathy  External genitalia:  Normal appearing vulva with no masses, tenderness, or lesions  BUS/Urethra/Skene's glands:  Normal  Vagina:  Normal appearing with normal color and discharge, no lesions  Cervix:  Normal appearing without discharge or lesions  Uterus:  Normal in size, shape and contour.  Midline and mobile, nontender  Adnexa/parametria:     Rt: Normal in size, without masses or tenderness.   Lt: Normal in size, without masses or tenderness.  Anus and perineum: Normal  Patient informed chaperone available to be present for breast and pelvic exam. Patient has requested no chaperone to be present. Patient has been advised what will be completed during breast and pelvic exam.   Assessment/Plan:  40 y.o. G0 for annual exam.   Well female exam with routine gynecological exam - Plan: Cytology - PAP( [place]). Education provided on SBEs, importance of preventative screenings, current guidelines, high calcium diet, regular exercise, and multivitamin daily.  Labs with PCP.   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 She has been experiencing some vaginal dryness but overall is tolerating well. Recommend using coconut oil for hydration and intercourse.   Screening for cervical cancer - 2001 LEEP - no dysplasia. Requesting pap today.   Return in 1 year for annual.   [place], 4:13 PM 06/28/2021

## 2021-06-29 LAB — CYTOLOGY - PAP
Adequacy: ABSENT
Diagnosis: NEGATIVE

## 2021-07-03 ENCOUNTER — Ambulatory Visit: Payer: 59 | Admitting: Nurse Practitioner

## 2021-07-04 ENCOUNTER — Ambulatory Visit: Payer: 59 | Admitting: Nurse Practitioner

## 2021-08-03 ENCOUNTER — Telehealth: Payer: Self-pay | Admitting: *Deleted

## 2021-08-04 ENCOUNTER — Encounter: Payer: Self-pay | Admitting: Adult Health

## 2021-08-08 ENCOUNTER — Other Ambulatory Visit: Payer: Self-pay

## 2021-08-08 ENCOUNTER — Inpatient Hospital Stay: Payer: 59 | Attending: Adult Health | Admitting: Adult Health

## 2021-08-08 ENCOUNTER — Encounter: Payer: Self-pay | Admitting: Adult Health

## 2021-08-08 VITALS — BP 121/71 | HR 71 | Temp 97.7°F | Resp 18 | Ht 65.5 in | Wt 150.5 lb

## 2021-08-08 DIAGNOSIS — Z1509 Genetic susceptibility to other malignant neoplasm: Secondary | ICD-10-CM | POA: Insufficient documentation

## 2021-08-08 DIAGNOSIS — Z79810 Long term (current) use of selective estrogen receptor modulators (SERMs): Secondary | ICD-10-CM | POA: Diagnosis not present

## 2021-08-08 DIAGNOSIS — C50411 Malignant neoplasm of upper-outer quadrant of right female breast: Secondary | ICD-10-CM | POA: Diagnosis present

## 2021-08-08 DIAGNOSIS — Z923 Personal history of irradiation: Secondary | ICD-10-CM | POA: Diagnosis not present

## 2021-08-08 DIAGNOSIS — Z1501 Genetic susceptibility to malignant neoplasm of breast: Secondary | ICD-10-CM | POA: Insufficient documentation

## 2021-08-08 DIAGNOSIS — R232 Flushing: Secondary | ICD-10-CM | POA: Diagnosis not present

## 2021-08-08 DIAGNOSIS — Z170 Estrogen receptor positive status [ER+]: Secondary | ICD-10-CM | POA: Diagnosis not present

## 2021-08-08 NOTE — Progress Notes (Signed)
Faxed referral and progress notes from [facility] and Genetics to Urology @ [phone]

## 2021-08-08 NOTE — Progress Notes (Signed)
SURVIVORSHIP VISIT:   BRIEF ONCOLOGIC HISTORY:  Oncology History  Malignant neoplasm of upper outer quadrant of female breast ([place])  12/09/2020 Initial Diagnosis   Screening mammogram showed right breast calcifications. Diagnostic mammogram and US showed 3 groups of calcifications in the right breast, largest 1.4cm, with two smaller measuring 0.1cm. 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 Testing was completed through the BRCAplus and CancerNext-Expanded + RNAinsight panels offered by [facility] laboratories. The report date is 12/28/2020.   The BRCAplus panel offered by [facility] and includes sequencing and deletion/duplication analysis for the following 8 genes: ATM, BRCA1, BRCA2, CDH1, CHEK2, PALB2, PTEN, and TP53. The CancerNext-Expanded + RNAinsight gene panel offered by [facility] and includes sequencing and rearrangement analysis for the following 77 genes: AIP, ALK, APC, ATM, AXIN2, BAP1, BARD1, BLM, BMPR1A, BRCA1, BRCA2, BRIP1, CDC73, CDH1, CDK4, CDKN1B, CDKN2A, CHEK2, CTNNA1, DICER1, FANCC, FH, FLCN, GALNT12, KIF1B, LZTR1, MAX, MEN1, MET, MLH1, MSH2, MSH3, MSH6, MUTYH, NBN, NF1, NF2, NTHL1, PALB2, PHOX2B, PMS2, POT1, PRKAR1A, PTCH1, PTEN, RAD51C, RAD51D, RB1, RECQL, RET, SDHA, SDHAF2, SDHB, SDHC, SDHD, SMAD4, SMARCA4, SMARCB1, SMARCE1, STK11, SUFU, TMEM127, TP53, TSC1, TSC2, VHL and XRCC2 (sequencing and deletion/duplication); EGFR, EGLN1, HOXB13, KIT, MITF, PDGFRA, POLD1 and POLE (sequencing only); EPCAM and GREM1  (deletion/duplication only). RNA data is routinely analyzed for use in variant interpretation for all genes.   01/18/2021 Surgery   Right lumpectomy: Incidental focus of IDC grade 1, 0.2 cm, foci of DCIS intermediate grade with focal necrosis, margins negative, ER 60%, PR 80% T1 a N0 stage Ia   01/18/2021 Cancer Staging   Staging form: Breast, AJCC 8th Edition - Pathologic stage from 01/18/2021: Stage IA (pT1a, pN0, cM0, G1, ER+, PR+, HER2-) - Signed by Gardenia Phlegm, NP on 07/28/2021 Stage prefix: Initial diagnosis Histologic grading system: 3 grade system    04/03/2021 - 05/17/2021 Radiation Therapy   Site Technique Total Dose (Gy) Dose per Fx (Gy) Completed Fx Beam Energies  Breast, Right: Breast_Rt 3D 50.4/50.4 1.8 28/28 6X  Breast, Right: Breast_Rt_Bst 3D 10/10 2 5/5 6X     06/11/2021 -  Anti-estrogen oral therapy   Tamoxifen, started at half dose, increased to full dose 06/25/21     INTERVAL HISTORY:  Ann Buckley to review her survivorship care plan detailing her treatment course for breast cancer, as well as monitoring long-term side effects of that treatment, education regarding health maintenance, screening, and overall wellness and health promotion.     Overall, Ann Buckley reports feeling quite well.  She is taking tamoxifen daily.  She started out at 10 mg and then dose increased to 20 mg.  She did have increased irritability and hot flashes when she increased the dosage, however, those have subsided for the most part.  She feels her hot flashes are still present however manageable.  Otherwise she is doing quite well today and is up-to-date with her health maintenance.  REVIEW OF SYSTEMS:  Review of Systems  Constitutional:  Negative for appetite change, chills, fatigue, fever and unexpected weight change.  HENT:  Negative for hearing loss, lump/mass and trouble swallowing.   Eyes:  Negative for eye problems and icterus.  Respiratory:   Negative for chest tightness, cough and shortness of breath.   Cardiovascular:  Negative for chest pain, leg swelling and palpitations.  Gastrointestinal:  Negative for abdominal distention, abdominal pain, constipation, diarrhea, nausea and vomiting.  Endocrine: Positive for hot flashes.  Genitourinary:  Negative for difficulty urinating.   Musculoskeletal:  Negative for arthralgias.  Skin:  Negative for itching and rash.  Neurological:  Negative for dizziness, extremity weakness, headaches and numbness.  Hematological:  Negative for adenopathy. Does not bruise/bleed easily.  Psychiatric/Behavioral:  Negative for depression. The patient is not nervous/anxious.   Breast: Denies any new nodularity, masses, tenderness, nipple changes, or nipple discharge.      ONCOLOGY TREATMENT TEAM:  1. Surgeon:  Dr. Marlou Starks at [facility] 2. Medical Oncologist: Dr. Lisbeth Renshaw  3. Radiation Oncologist: Dr. Lindi Adie    PAST MEDICAL/SURGICAL HISTORY:  Past Medical History:  Diagnosis Date   Abnormal Pap smear of cervix 2001   Anxiety    Family history of breast cancer    PCOS (polycystic ovarian syndrome)    STD (sexually transmitted disease)    hsv1   Past Surgical History:  Procedure Laterality Date   BREAST LUMPECTOMY WITH RADIOACTIVE SEED LOCALIZATION Right 01/18/2021   Procedure: RIGHT BREAST LUMPECTOMY WITH RADIOACTIVE SEED LOCALIZATION X 2;  Surgeon: Jovita Kussmaul, MD;  Location: [facility];  Service: General;  Laterality: Right;   CERVICAL BIOPSY  W/ LOOP ELECTRODE EXCISION  Age 41 2001   Paps have been normal eversince    COLPOSCOPY     INTRAUTERINE DEVICE (IUD) INSERTION  07/2017   Mirena    SENTINEL NODE BIOPSY Right 02/21/2021   Procedure: RIGHT SENTINEL NODE BIOPSY;  Surgeon: Jovita Kussmaul, MD;  Location: [place] [facility];  Service: General;  Laterality: Right;  60 MINUTES ROOM 5     ALLERGIES:  Allergies  Allergen Reactions   Other Hives    Chlorhexidine skin prep    Penicillins Rash    Tolerates ancef   Sulfamethoxazole-Trimethoprim Rash     CURRENT MEDICATIONS:  Outpatient Encounter Medications as of 08/08/2021  Medication Sig   ALPRAZolam (XANAX) 1 MG tablet Take 1 mg by mouth 3 (three) times daily as needed for anxiety.   MAGNESIUM PO Take by mouth.   Multiple Vitamin (MULTIVITAMIN PO) Take by mouth.   Probiotic Product (PROBIOTIC PO) Take by mouth.   tamoxifen (NOLVADEX) 20 MG tablet Take 1 tablet (20 mg total) by mouth daily.   fluticasone (FLONASE) 50 MCG/ACT nasal spray Place 2 sprays into both nostrils daily as needed for allergies or rhinitis. (Patient not taking: Reported on 08/08/2021)   [DISCONTINUED] ibuprofen (ADVIL,MOTRIN) 800 MG tablet Take 1 tablet (800 mg total) by mouth every 8 (eight) hours as needed.   No facility-administered encounter medications on file as of 08/08/2021.     ONCOLOGIC FAMILY HISTORY:  Family History  Problem Relation Age of Onset   Diabetes Mother    Heart attack Paternal Grandfather    Hypertension Father    Breast cancer Paternal Grandmother        dx 71s   Diabetes Maternal Grandmother    Diabetes Maternal Grandfather      GENETIC COUNSELING/TESTING: See above   SOCIAL HISTORY:  Social History   Socioeconomic History   Marital status: Married    Spouse name: Not on file   Number of children:  Not on file   Years of education: Not on file   Highest education level: Not on file  Occupational History   Not on file  Tobacco Use   Smoking status: Never   Smokeless tobacco: Never  Vaping Use   Vaping Use: Never used  Substance and Sexual Activity   Alcohol use: Yes    Alcohol/week: 5.0 standard drinks    Types: 5 Shots of liquor per week    Comment: occasionally   Drug use: Yes    Types: Marijuana   Sexual activity: Yes    Partners: Male    Comment: Mirena removed 12/15/20, partner VAS  Other Topics Concern   Not on file  Social History Narrative   Not on file   Social  Determinants of Health   Financial Resource Strain: Low Risk    Difficulty of Paying Living Expenses: Not hard at all  Food Insecurity: No Food Insecurity   Worried About Charity fundraiser in the Last Year: Never true   [place] in the Last Year: Never true  Transportation Needs: No Transportation Needs   Lack of Transportation (Medical): No   Lack of Transportation (Non-Medical): No  Physical Activity: Sufficiently Active   Days of Exercise per Week: 6 days   Minutes of Exercise per Session: 60 min  Stress: No Stress Concern Present   Feeling of Stress : Only a little  Social Connections: Moderately Isolated   Frequency of Communication with Friends and Family: More than three times a week   Frequency of Social Gatherings with Friends and Family: Twice a week   Attends Religious Services: Never   Marine scientist or Organizations: No   Attends Music therapist: Never   Marital Status: Married  Human resources officer Violence: Not At Risk   Fear of Current or Ex-Partner: No   Emotionally Abused: No   Physically Abused: No   Sexually Abused: No     OBSERVATIONS/OBJECTIVE:  BP 121/71 (BP Location: Left Arm, Patient Position: Sitting)    Pulse 71    Temp 97.7 F (36.5 C) (Temporal)    Resp 18    Ht 5' 5.5" (1.664 m)    Wt 150 lb 8 oz (68.3 kg)    SpO2 100%    BMI 24.66 kg/m  GENERAL: Patient is a well appearing female in no acute distress HEENT:  Sclerae anicteric.  Oropharynx clear and moist. No ulcerations or evidence of oropharyngeal candidiasis. Neck is supple.  NODES:  No cervical, supraclavicular, or axillary lymphadenopathy palpated.  BREAST EXAM: Right breast status postlumpectomy and radiation no sign of local recurrence left breast is benign LUNGS:  Clear to auscultation bilaterally.  No wheezes or rhonchi. HEART:  Regular rate and rhythm. No murmur appreciated. ABDOMEN:  Soft, nontender.  Positive, normoactive bowel sounds. No organomegaly  palpated. MSK:  No focal spinal tenderness to palpation. Full range of motion bilaterally in the upper extremities. EXTREMITIES:  No peripheral edema.   SKIN:  Clear with no obvious rashes or skin changes. No nail dyscrasia. NEURO:  Nonfocal. Well oriented.  Appropriate affect.   LABORATORY DATA:  None for this visit.  DIAGNOSTIC IMAGING:  None for this visit.      ASSESSMENT AND PLAN:  Ms.. Buckley is a pleasant 41 y.o. female with Stage IA right breast invasive ductal carcinoma, ER+/PR+/HER2-, diagnosed in 11/2020, treated with lumpectomy, adjuvant radiation therapy, and anti-estrogen therapy with Tamoxifen beginning in 05/2021.  She presents to the  Survivorship Clinic for our initial meeting and routine follow-up post-completion of treatment for breast cancer.    1. Stage IA right breast cancer:  Ann Buckley is continuing to recover from definitive treatment for breast cancer. She will follow-up with her medical oncologist, Dr. Lindi Adie in 6 months with history and physical exam per surveillance protocol.  She will continue her anti-estrogen therapy with Tamoxifen. Thus far, she is tolerating the Tamoxifen well, with minimal side effects. She was instructed to make Dr. Lindi Adie or myself aware if she begins to experience any worsening side effects of the medication and I could see her back in clinic to help manage those side effects, as needed. Her mammogram is due 10/2021; orders placed today.  Her breast density is category C.  She would like to pursue additional testing.  We discussed the option of the abbreviated breast MRI.  She is very interested in this additional screening and I gave a pamphlet about it today.  If she proceeds with this imaging we would recommend that she do it in October 2023.  Today, a comprehensive survivorship care plan and treatment summary was reviewed with the patient today detailing her breast cancer diagnosis, treatment course, potential  late/long-term effects of treatment, appropriate follow-up care with recommendations for the future, and patient education resources.  A copy of this summary, along with a letter will be sent to the patients primary care provider via mail/fax/In Basket message after todays visit.    2. MITF genetic mutation: This can increase the risk of melanoma and also renal cell carcinoma.  We discussed this in detail.  She is seeing dermatology for skin exam.  She has no skin concerns.  I will also place [facility] to discuss this mutation further and the possibility of renal ultrasound or other testing modality for surveillance.  3. Bone health:   She was given education on specific activities to promote bone health.  4. Cancer screening:  Due to Ann Buckley's history and her age, she should receive screening for skin cancers, colon cancer, and gynecologic cancers.  The information and recommendations are listed on the patient's comprehensive care plan/treatment summary and were reviewed in detail with the patient.    5. Health maintenance and wellness promotion: Ann Buckley was encouraged to consume 5-7 servings of fruits and vegetables per day. We reviewed the "Nutrition Rainbow" handout.  She was also encouraged to engage in moderate to vigorous exercise for 30 minutes per day most days of the week. We discussed the [employer] fitness program, which is designed for cancer survivors to help them become more physically fit after cancer treatments.  She was instructed to limit her alcohol consumption and continue to abstain from tobacco use.     6. Support services/counseling: It is not uncommon for this period of the patient's cancer care trajectory to be one of many emotions and stressors.  She was given information regarding our available services and encouraged to contact me with any questions or for help enrolling in any of our support group/programs.    Follow  up instructions:    -Return to cancer center in August 2023 for follow-up with Dr. Lindi Adie -Mammogram due in April 2023 -[facility]erral [facility] -Follow up with surgery in February 2023 -Abbreviated breast MRI -She is welcome to return back to the Survivorship Clinic at any time; no additional follow-up needed at this time.  -Consider referral back to survivorship as a long-term survivor for continued surveillance  The patient was provided an opportunity to ask questions and all were answered. The patient agreed with the plan and demonstrated an understanding of the instructions.   Total encounter time: 45 minutes in face-to-face visit time, chart review, lab review, order entry, care coordination, and documentation of the encounter.  Wilber Bihari, NP 08/08/21 10:25 AM Medical Oncology and Hematology [facility] [place], [place] [address] Tel. [phone]    Fax. [phone]  *Total Encounter Time as defined by the Centers for Medicare and Medicaid Services includes, in addition to the face-to-face time of a patient visit (documented in the note above) non-face-to-face time: obtaining and reviewing outside history, ordering and reviewing medications, tests or procedures, care coordination (communications with other health care professionals or caregivers) and documentation in the medical record.

## 2021-08-14 ENCOUNTER — Ambulatory Visit: Payer: 59

## 2021-09-04 ENCOUNTER — Ambulatory Visit: Payer: 59 | Attending: General Surgery

## 2021-09-04 ENCOUNTER — Other Ambulatory Visit: Payer: Self-pay

## 2021-09-04 VITALS — Wt 150.1 lb

## 2021-09-04 DIAGNOSIS — Z483 Aftercare following surgery for neoplasm: Secondary | ICD-10-CM | POA: Insufficient documentation

## 2021-09-04 NOTE — Therapy (Signed)
[place] @ [address] [place], [place], [address] Phone: [phone]   Fax:  [phone]  Physical Therapy Treatment  Patient Details  Name: Ann Buckley MRN: [id number] Date of Birth: [birth year] Referring Provider (PT): Dr. Marlou Starks   Encounter Date: 09/04/2021   PT End of Session - 09/04/21 0909     Visit Number 2   # unchanged due to screen only   PT Start Time 0908    PT Stop Time 0912    PT Time Calculation (min) 4 min    Activity Tolerance Patient tolerated treatment well    Behavior During Therapy [facility] for tasks assessed/performed             Past Medical History:  Diagnosis Date   Abnormal Pap smear of cervix 2001   Anxiety    Family history of breast cancer    PCOS (polycystic ovarian syndrome)    STD (sexually transmitted disease)    hsv1    Past Surgical History:  Procedure Laterality Date   BREAST LUMPECTOMY WITH RADIOACTIVE SEED LOCALIZATION Right 01/18/2021   Procedure: RIGHT BREAST LUMPECTOMY WITH RADIOACTIVE SEED LOCALIZATION X 2;  Surgeon: Jovita Kussmaul, MD;  Location: [place];  Service: General;  Laterality: Right;   CERVICAL BIOPSY  W/ LOOP ELECTRODE EXCISION  Age 50 2001   Paps have been normal eversince    COLPOSCOPY     INTRAUTERINE DEVICE (IUD) INSERTION  07/2017   Mirena    SENTINEL NODE BIOPSY Right 02/21/2021   Procedure: RIGHT SENTINEL NODE BIOPSY;  Surgeon: Jovita Kussmaul, MD;  Location: [place];  Service: General;  Laterality: Right;  60 MINUTES ROOM 5    Vitals:   09/04/21 0908  Weight: 150 lb 2 oz (68.1 kg)     Subjective Assessment - 09/04/21 0908     Subjective Pt returns for her 3 month L-Dex screen.    Pertinent History Screening mammogram showed right breast calcifications. Diagnostic mammogram and US showed 3 groups of calcifications in the right breast, largest 1.4cm, with two smaller measuring 0.1cm. Biopsy showed intermediate grade DCIS,  ER+ 60%, PR+ 80%, Her 2- with Ki67 of 2%.  She had a right lumpectomy performed on 12/29/20 and is pending a SLNB on 02/21/2021.  She also had a positive genetic test.She will have adjuvant radiation and anti estrogens                    L-DEX FLOWSHEETS - 09/04/21 0900       L-DEX LYMPHEDEMA SCREENING   Measurement Type Unilateral    L-DEX MEASUREMENT EXTREMITY Upper Extremity    POSITION  Standing    DOMINANT SIDE Right    At Risk Side Right    BASELINE SCORE (UNILATERAL) -1.7    L-DEX SCORE (UNILATERAL) -3    VALUE CHANGE (UNILAT) -1.3                                     PT Long Term Goals - 03/09/21 1333       PT LONG TERM GOAL #1   Title Pt will have full post op Right shoulder ROM to resume all usual home activities    Time 6    Period Weeks    Status Achieved    Target Date 03/09/21  Plan - 09/04/21 0914     Clinical Impression Statement Pt returns for her 3 month L-Dex screen. Her change from baseline of -1.3 is WNLs so no further treatment is required at this time except to cont every 3 month L-Dex screens which pt is agreeable to.    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

## 2021-09-07 ENCOUNTER — Encounter: Payer: Self-pay | Admitting: Hematology and Oncology

## 2021-09-08 ENCOUNTER — Encounter: Payer: Self-pay | Admitting: Adult Health

## 2021-09-13 ENCOUNTER — Encounter: Payer: Self-pay | Admitting: *Deleted

## 2021-09-13 ENCOUNTER — Encounter: Payer: Self-pay | Admitting: Hematology and Oncology

## 2021-09-20 ENCOUNTER — Other Ambulatory Visit: Payer: Self-pay | Admitting: Urology

## 2021-09-20 DIAGNOSIS — Z1289 Encounter for screening for malignant neoplasm of other sites: Secondary | ICD-10-CM

## 2021-09-28 DIAGNOSIS — L219 Seborrheic dermatitis, unspecified: Secondary | ICD-10-CM | POA: Insufficient documentation

## 2021-09-28 DIAGNOSIS — L811 Chloasma: Secondary | ICD-10-CM | POA: Insufficient documentation

## 2021-10-13 ENCOUNTER — Ambulatory Visit
Admission: RE | Admit: 2021-10-13 | Discharge: 2021-10-13 | Disposition: A | Discharge status: Home / Self Care | Payer: 59 | Source: Ambulatory Visit | Attending: Urology | Admitting: Urology

## 2021-10-13 DIAGNOSIS — Z1289 Encounter for screening for malignant neoplasm of other sites: Secondary | ICD-10-CM

## 2021-10-31 ENCOUNTER — Encounter (HOSPITAL_COMMUNITY): Payer: Self-pay

## 2021-11-10 ENCOUNTER — Ambulatory Visit
Admission: RE | Admit: 2021-11-10 | Discharge: 2021-11-10 | Disposition: A | Discharge status: Home / Self Care | Payer: 59 | Source: Ambulatory Visit | Attending: Adult Health | Admitting: Adult Health

## 2021-11-10 DIAGNOSIS — C50411 Malignant neoplasm of upper-outer quadrant of right female breast: Secondary | ICD-10-CM

## 2021-11-10 HISTORY — DX: Personal history of irradiation: Z92.3

## 2021-11-27 ENCOUNTER — Ambulatory Visit: Payer: 59 | Attending: General Surgery

## 2021-11-27 VITALS — Wt 154.4 lb

## 2021-11-27 DIAGNOSIS — Z483 Aftercare following surgery for neoplasm: Secondary | ICD-10-CM | POA: Insufficient documentation

## 2021-11-27 NOTE — Therapy (Signed)
?  OUTPATIENT PHYSICAL THERAPY SOZO SCREENING NOTE ? ? ?Patient Name: Ann Buckley ?MRN: [id number] ?DOB:1981-03-25, 41 y.o., female ?Today's Date: 11/27/2021 ? ?PCP: Merrilee Seashore, MD ?REFERRING PROVIDER: Merrilee Seashore, MD ? ? PT End of Session - 11/27/21 0848   ? ? Visit Number 2   # unchanged due to screen only  ? PT Start Time [id number]   ? PT Stop Time 0850   ? PT Time Calculation (min) 3 min   ? Activity Tolerance Patient tolerated treatment well   ? Behavior During Therapy [facility] for tasks assessed/performed   ? ?  ?  ? ?  ? ? ?Past Medical History:  ?Diagnosis Date  ? Abnormal Pap smear of cervix 2001  ? Anxiety   ? Breast cancer ([place]) 01/18/2021  ? right lumpectomy  ? Family history of breast cancer   ? PCOS (polycystic ovarian syndrome)   ? Personal history of radiation therapy   ? 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 ?Past Surgical History:  ?Procedure Laterality Date  ? BREAST LUMPECTOMY Right 01/18/2021  ? BREAST LUMPECTOMY WITH RADIOACTIVE SEED LOCALIZATION Right 01/18/2021  ? Procedure: RIGHT BREAST LUMPECTOMY WITH RADIOACTIVE SEED LOCALIZATION X 2;  Surgeon: Jovita Kussmaul, MD;  Location: [place];  Service: General;  Laterality: Right;  ? CERVICAL BIOPSY  W/ LOOP ELECTRODE EXCISION  Age 20 2001  ? Paps have been normal eversince   ? COLPOSCOPY    ? INTRAUTERINE DEVICE (IUD) INSERTION  07/2017  ? Mirena   ? SENTINEL NODE BIOPSY Right 02/21/2021  ? Procedure: RIGHT SENTINEL NODE BIOPSY;  Surgeon: Jovita Kussmaul, MD;  Location: [place];  Service: General;  Laterality: Right;  60 MINUTES ROOM 5  ? ?Patient Active Problem List  ? Diagnosis Date Noted  ? 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 Genetic testing 12/20/2020  ? Family history of breast cancer   ? Malignant neoplasm of upper outer quadrant of female breast ([place]) 12/09/2020  ? ? ?REFERRING DIAG: right breast cancer at risk for lymphedema ? ?THERAPY DIAG:  ?Aftercare following surgery for  neoplasm ? ?PERTINENT HISTORY: Screening mammogram showed right breast calcifications. Diagnostic mammogram and US showed 3 groups of calcifications in the right breast, largest 1.4cm, with two smaller measuring 0.1cm. 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  She had a right lumpectomy performed on 12/29/20 and is pending a SLNB on 02/21/2021.  She also had a positive genetic test.She will have adjuvant radiation and anti estrogens  ? ?PRECAUTIONS: right UE Lymphedema risk, None ? ?SUBJECTIVE: Pt returns for her 3 month L-Dex screen.  ? ?PAIN:  ?Are you having pain? No ? ?SOZO SCREENING: ?Patient was assessed today using the SOZO machine to determine the lymphedema index score. This was compared to her baseline score. It was determined that she is within the recommended range when compared to her baseline and no further action is needed at this time. She will continue SOZO screenings. These are done every 3 months for 2 years post operatively followed by every 6 months for 2 years, and then annually. ? ? ?Otelia Limes, PTA ?11/27/2021, 8:52 AM ? ?  ? ?

## 2022-02-21 ENCOUNTER — Telehealth: Payer: Self-pay | Admitting: Hematology and Oncology

## 2022-02-21 NOTE — Telephone Encounter (Signed)
Rescheduled appointment per provider PAL. Left voicemail. 

## 2022-02-23 ENCOUNTER — Ambulatory Visit: Payer: 59

## 2022-03-14 ENCOUNTER — Encounter: Payer: Self-pay | Admitting: Hematology and Oncology

## 2022-03-16 ENCOUNTER — Inpatient Hospital Stay: Payer: 59 | Admitting: Hematology and Oncology

## 2022-03-21 NOTE — Progress Notes (Addendum)
Patient Care Team: Merrilee Seashore, MD as PCP - General (Internal Medicine) Jovita Kussmaul, MD as Consulting Physician (General Surgery) Nicholas Lose, MD as Consulting Physician (Hematology and Oncology) Kyung Rudd, MD as Consulting Physician (Radiation Oncology) Tamela Gammon, NP as Nurse Practitioner (Gynecology)  DIAGNOSIS:  Encounter Diagnosis  Name Primary?   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 Diagnostic mammogram and US showed 3 groups of calcifications in the right breast, largest 1.4cm, with two smaller measuring 0.1cm. 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   12/14/2020 Cancer Staging   Staging form: Breast, AJCC 8th Edition - Clinical stage from 12/14/2020: Stage 0 (cTis (DCIS), cN0, cM0, G2, ER+, PR+, HER2: Not Assessed) - Signed by Nicholas Lose, MD on 12/14/2020 Stage prefix: Initial diagnosis Histologic grading system: 3 grade system   12/20/2020 Genetic Testing   Positive genetic testing:  A single, heterozygous pathogenic variant was detected in the MITF gene called p.[id number] (c.952G>A). Testing was completed through the BRCAplus and CancerNext-Expanded + RNAinsight panels offered by [facility] laboratories. The report date is 12/28/2020.   The BRCAplus panel offered by [facility] and includes sequencing and deletion/duplication analysis for the following 8 genes: ATM, BRCA1, BRCA2, CDH1, CHEK2, PALB2, PTEN, and TP53. The CancerNext-Expanded + RNAinsight gene panel offered by [facility] and includes sequencing and rearrangement analysis for the following 77 genes: AIP, ALK, APC, ATM, AXIN2, BAP1, BARD1, BLM, BMPR1A, BRCA1, BRCA2, BRIP1, CDC73, CDH1, CDK4, CDKN1B,  CDKN2A, CHEK2, CTNNA1, DICER1, FANCC, FH, FLCN, GALNT12, KIF1B, LZTR1, MAX, MEN1, MET, MLH1, MSH2, MSH3, MSH6, MUTYH, NBN, NF1, NF2, NTHL1, PALB2, PHOX2B, PMS2, POT1, PRKAR1A, PTCH1, PTEN, RAD51C, RAD51D, RB1, RECQL, RET, SDHA, SDHAF2, SDHB, SDHC, SDHD, SMAD4, SMARCA4, SMARCB1, SMARCE1, STK11, SUFU, TMEM127, TP53, TSC1, TSC2, VHL and XRCC2 (sequencing and deletion/duplication); EGFR, EGLN1, HOXB13, KIT, MITF, PDGFRA, POLD1 and POLE (sequencing only); EPCAM and GREM1 (deletion/duplication only). RNA data is routinely analyzed for use in variant interpretation for all genes.   01/18/2021 Surgery   Right lumpectomy: Incidental focus of IDC grade 1, 0.2 cm, foci of DCIS intermediate grade with focal necrosis, margins negative, ER 60%, PR 80% T1 a N0 stage Ia   01/18/2021 Cancer Staging   Staging form: Breast, AJCC 8th Edition - Pathologic stage from 01/18/2021: Stage IA (pT1a, pN0, cM0, G1, ER+, PR+, HER2-) - Signed by Gardenia Phlegm, NP on 07/28/2021 Stage prefix: Initial diagnosis Histologic grading system: 3 grade system   04/03/2021 - 05/17/2021 Radiation Therapy   Site Technique Total Dose (Gy) Dose per Fx (Gy) Completed Fx Beam Energies  Breast, Right: Breast_Rt 3D 50.4/50.4 1.8 28/28 6X  Breast, Right: Breast_Rt_Bst 3D 10/10 2 5/5 6X     06/11/2021 -  Anti-estrogen oral therapy   Tamoxifen, started at half dose, increased to full dose 06/25/21     CHIEF COMPLIANT: Follow-up breast cancer on Tamoxifen  INTERVAL HISTORY: Ann Buckley is a 41 y.o. with above-mentioned history of right breast cancer having undergone right lumpectomy, currently on radiation therapy. She presents to the clinic today for follow-up. She states that she is having some side effects.. She does have some pain under arm. She has intense hot flashes aches and pain in bones, cycle irregular no clots and not heavy. She has insomnia  that started lately. She does take the tamoxifen in the morning. Overall  she can tolerate it. She does boot camp 2-3 times a week. Opposite days she walks. She also does yoga.   ALLERGIES:  is allergic to other, penicillins, and sulfamethoxazole-trimethoprim.  MEDICATIONS:  Current Outpatient Medications  Medication Sig Dispense Refill   ALPRAZolam (XANAX) 1 MG tablet Take 1 mg by mouth 3 (three) times daily as needed for anxiety.     Cholecalciferol (VITAMIN D3) 50 MCG (2000 UT) TABS Take 1 capsule by mouth daily.     fluocinolone (SYNALAR) 0.01 % external solution SMARTSIG:Topical 2-3 Times Weekly     ketoconazole (NIZORAL) 2 % shampoo SMARTSIG:5 Milliliter(s) Topical 2-3 Times Daily     MAGNESIUM PO Take by mouth.     Multiple Vitamin (MULTIVITAMIN PO) Take by mouth.     Probiotic Product (PROBIOTIC PO) Take by mouth.     tamoxifen (NOLVADEX) 20 MG tablet Take 1 tablet (20 mg total) by mouth daily. 90 tablet 3   triamcinolone ointment (KENALOG) 0.5 % Apply topically.     No current facility-administered medications for this visit.    PHYSICAL EXAMINATION: ECOG PERFORMANCE STATUS: 1 - Symptomatic but completely ambulatory  Vitals:   03/23/22 1055  BP: 107/76  Pulse: 98  Resp: 15  Temp: 97.7 F (36.5 C)  SpO2: 98%   Filed Weights   03/23/22 1055  Weight: 151 lb 8 oz (68.7 kg)    BREAST: No palpable masses or nodules in either right or left breasts. No palpable axillary supraclavicular or infraclavicular adenopathy no breast tenderness or nipple discharge. (exam performed in the presence of a chaperone)  LABORATORY DATA:  I have reviewed the data as listed    Latest Ref Rng & Units 01/11/2021    9:19 AM 12/14/2020   12:18 PM  CMP  Glucose 70 - 99 mg/dL 84  130   BUN 6 - 20 mg/dL 9  10   Creatinine 0.44 - 1.00 mg/dL 0.77  0.76   Sodium 135 - 145 mmol/L 136  140   Potassium 3.5 - 5.1 mmol/L 4.1  3.9   Chloride 98 - 111 mmol/L 105  106   CO2 22 - 32 mmol/L 24  26   Calcium 8.9 - 10.3 mg/dL 9.6  9.5   Total Protein 6.5 - 8.1 g/dL 7.6  7.4    Total Bilirubin 0.3 - 1.2 mg/dL 1.1  0.6   Alkaline Phos 38 - 126 U/L 32  36   AST 15 - 41 U/L 19  16   ALT 0 - 44 U/L 15  10     Lab Results  Component Value Date   WBC 5.3 01/11/2021   HGB 14.1 01/11/2021   HCT 41.4 01/11/2021   MCV 97.6 01/11/2021   PLT 322 01/11/2021   NEUTROABS 4.5 12/14/2020    ASSESSMENT & PLAN:  Malignant neoplasm of upper outer quadrant of female breast ([place]) 12/09/2020:Screening mammogram showed right breast calcifications. Diagnostic mammogram and US showed 3 groups of calcifications in the right breast, largest 1.4cm, with two smaller measuring 0.1cm. 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   Treatment plan: 1. Breast conserving surgery Right lumpectomy: Incidental focus of IDC grade 1, 0.2 cm, foci of DCIS intermediate grade with focal necrosis, margins negative, ER 60%, PR 80%, Ki-67 2%, HER2 negative T1 a N0 stage Ia 2. sentinel lymph node study 02/21/2021: 0/5 lymph nodes negative 3. Followed by adjuvant radiation therapy 04/04/2021-05/17/2021 4.  Followed by antiestrogen therapy with tamoxifen 5-10 years ------------------------------------------------------------------------------------------------------------------------------------------ Tamoxifen toxicities: 1.  Insomnia: Encouraged her to take Tamoxifen at bedtime. 2. hot flashes: Intermittently they come and go. Overall she is tolerating tamoxifen fairly well and will continue to remain on it.  Breast cancer surveillance: 1.  Breast exam 03/23/2022: Benign 2. mammogram 11/10/2021: Benign breast density category C 3.  Breast MRI scheduled for October 2023 (she may decide to cancel it based upon our discussion that she may not really need this MRI and that there are significant number of false positives.)  Return to clinic in 1 year for follow-up    No orders of the defined types were placed in this encounter.  The patient has a good understanding of the overall plan. she  agrees with it. she will call with any problems that may develop before the next visit here. Total time spent: 30 mins including face to face time and time spent for planning, charting and co-ordination of care   Harriette Ohara, MD 03/23/22    I Gardiner Coins am scribing for Dr. Lindi Adie  I have reviewed the above documentation for accuracy and completeness, and I agree with the above.

## 2022-03-23 ENCOUNTER — Inpatient Hospital Stay: Payer: 59 | Attending: Hematology and Oncology | Admitting: Hematology and Oncology

## 2022-03-23 ENCOUNTER — Other Ambulatory Visit: Payer: Self-pay

## 2022-03-23 DIAGNOSIS — Z79810 Long term (current) use of selective estrogen receptor modulators (SERMs): Secondary | ICD-10-CM | POA: Insufficient documentation

## 2022-03-23 DIAGNOSIS — Z1501 Genetic susceptibility to malignant neoplasm of breast: Secondary | ICD-10-CM | POA: Diagnosis not present

## 2022-03-23 DIAGNOSIS — Z170 Estrogen receptor positive status [ER+]: Secondary | ICD-10-CM | POA: Diagnosis not present

## 2022-03-23 DIAGNOSIS — C50411 Malignant neoplasm of upper-outer quadrant of right female breast: Secondary | ICD-10-CM | POA: Insufficient documentation

## 2022-03-23 NOTE — Assessment & Plan Note (Addendum)
12/09/2020:Screening mammogram showed right breast calcifications. Diagnostic mammogram and US showed 3 groups of calcifications in the right breast, largest 1.4cm, with two smaller measuring 0.1cm. 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  Treatment plan: 1. Breast conserving surgeryRight lumpectomy: Incidental focus of IDC grade 1, 0.2 cm, foci of DCIS intermediate grade with focal necrosis, margins negative, ER 60%, PR 80%, Ki-67 2%, HER2 negative T1 a N0 stage Ia 2.sentinel lymph node study 02/21/2021: 0/5 lymph nodes negative 3. Followed by adjuvant radiation therapy 04/04/2021-05/17/2021 4. Followed by antiestrogen therapy with tamoxifen 5-10 years ------------------------------------------------------------------------------------------------------------------------------------------ Tamoxifen toxicities: 1.  Insomnia: Encouraged her to take Tamoxifen at bedtime. 2. hot flashes: Intermittently they come and go. Overall she is tolerating tamoxifen fairly well and will continue to remain on it.  Breast cancer surveillance: 1.  Breast exam 03/23/2022: Benign 2. mammogram 11/10/2021: Benign breast density category C 3.  Breast MRI scheduled for October 2023 (she may decide to cancel it based upon our discussion that she may not really need this MRI and that there are significant number of false positives.)  Return to clinic in 1 year for follow-up

## 2022-03-27 ENCOUNTER — Telehealth: Payer: Self-pay | Admitting: Hematology and Oncology

## 2022-03-27 NOTE — Telephone Encounter (Signed)
Scheduled appointment per 9/1 los. Patient is aware.

## 2022-03-29 ENCOUNTER — Encounter: Payer: Self-pay | Admitting: Hematology and Oncology

## 2022-04-02 ENCOUNTER — Ambulatory Visit: Payer: 59 | Attending: General Surgery

## 2022-04-02 VITALS — Wt 151.2 lb

## 2022-04-02 DIAGNOSIS — Z483 Aftercare following surgery for neoplasm: Secondary | ICD-10-CM | POA: Insufficient documentation

## 2022-04-02 NOTE — Therapy (Signed)
  OUTPATIENT PHYSICAL THERAPY SOZO SCREENING NOTE   Patient Name: Ann Buckley MRN: [id number] DOB:1981/03/19, 41 y.o., female Today's Date: 04/02/2022  PCP: Merrilee Seashore, MD REFERRING PROVIDER: Jovita Kussmaul, MD   PT End of Session - 04/02/22 [phone]     Visit Number 2   # unchanged due to screen only   PT Start Time 8338    PT Stop Time 2505    PT Time Calculation (min) 4 min    Activity Tolerance Patient tolerated treatment well    Behavior During Therapy [facility] for tasks assessed/performed             Past Medical History:  Diagnosis Date   Abnormal Pap smear of cervix 2001   Anxiety    Breast cancer ([place]) 01/18/2021   right lumpectomy   Family history of breast cancer    PCOS (polycystic ovarian syndrome)    Personal history of radiation therapy    STD (sexually transmitted disease)    hsv1   Past Surgical History:  Procedure Laterality Date   BREAST LUMPECTOMY Right 01/18/2021   BREAST LUMPECTOMY WITH RADIOACTIVE SEED LOCALIZATION Right 01/18/2021   Procedure: RIGHT BREAST LUMPECTOMY WITH RADIOACTIVE SEED LOCALIZATION X 2;  Surgeon: Jovita Kussmaul, MD;  Location: [place];  Service: General;  Laterality: Right;   CERVICAL BIOPSY  W/ LOOP ELECTRODE EXCISION  Age 59 2001   Paps have been normal eversince    COLPOSCOPY     INTRAUTERINE DEVICE (IUD) INSERTION  07/2017   Mirena    SENTINEL NODE BIOPSY Right 02/21/2021   Procedure: RIGHT SENTINEL NODE BIOPSY;  Surgeon: Jovita Kussmaul, MD;  Location: [place];  Service: General;  Laterality: Right;  60 MINUTES ROOM 5   Patient Active Problem List   Diagnosis Date Noted   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 Diagnostic mammogram and US showed 3 groups of calcifications in the right breast, largest 1.4cm, with two smaller measuring 0.1cm. 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  She had a right lumpectomy performed on 12/29/20 and is pending a SLNB on 02/21/2021.  She also had a positive genetic test.She will have adjuvant radiation and anti estrogens   PRECAUTIONS: right UE Lymphedema risk, None  SUBJECTIVE: Pt returns for her 3 month L-Dex screen.   PAIN:  Are you having pain? No  SOZO SCREENING: Patient was assessed today using the SOZO machine to determine the lymphedema index score. This was compared to her baseline score. It was determined that she is within the recommended range when compared to her baseline and no further action is needed at this time. She will continue SOZO screenings. These are done every 3 months for 2 years post operatively followed by every 6 months for 2 years, and then annually.   Otelia Limes, PTA 04/02/2022, 8:53 AM

## 2022-04-27 ENCOUNTER — Ambulatory Visit
Admission: RE | Admit: 2022-04-27 | Discharge: 2022-04-27 | Disposition: A | Discharge status: Home / Self Care | Payer: 59 | Source: Ambulatory Visit | Attending: Adult Health | Admitting: Adult Health

## 2022-04-27 DIAGNOSIS — Z170 Estrogen receptor positive status [ER+]: Secondary | ICD-10-CM

## 2022-04-27 MED ORDER — GADOBUTROL 1 MMOL/ML IV SOLN
7.0000 mL | Freq: Once | INTRAVENOUS | Status: AC | PRN
  Administered 2022-04-27: 7 mL via INTRAVENOUS

## 2022-04-30 ENCOUNTER — Telehealth: Payer: Self-pay | Admitting: *Deleted

## 2022-04-30 NOTE — Telephone Encounter (Signed)
Per Marline Backbone, called pt with message below. Left message on pt personal cell with results, advised pt to call office with concerns.

## 2022-04-30 NOTE — Telephone Encounter (Signed)
-----   Message from Gardenia Phlegm, NP sent at 04/30/2022 12:50 PM EDT ----- No cancer on breast MRI please let patient know ----- Message ----- From: Interface, Rad Results In Sent: 04/27/2022  12:57 PM EDT To: Gardenia Phlegm, NP

## 2022-05-05 ENCOUNTER — Other Ambulatory Visit: Payer: Self-pay | Admitting: Hematology and Oncology

## 2022-07-04 ENCOUNTER — Encounter: Payer: Self-pay | Admitting: Hematology and Oncology

## 2022-07-09 ENCOUNTER — Ambulatory Visit: Payer: 59 | Attending: General Surgery

## 2022-07-09 VITALS — Wt 156.0 lb

## 2022-07-09 DIAGNOSIS — Z483 Aftercare following surgery for neoplasm: Secondary | ICD-10-CM | POA: Insufficient documentation

## 2022-07-09 NOTE — Therapy (Signed)
OUTPATIENT PHYSICAL THERAPY SOZO SCREENING NOTE   Patient Name: Ann Buckley MRN: [id number] DOB:23-Jul-1981, 41 y.o., female Today's Date: 07/09/2022  PCP: Merrilee Seashore, MD REFERRING PROVIDER: Jovita Kussmaul, MD   PT End of Session - 07/09/22 [phone]     Visit Number 2   # unchanged due to screen only   PT Start Time 7616    PT Stop Time 0938    PT Time Calculation (min) 4 min    Activity Tolerance Patient tolerated treatment well    Behavior During Therapy [facility] for tasks assessed/performed             Past Medical History:  Diagnosis Date   Abnormal Pap smear of cervix 2001   Anxiety    Breast cancer ([place]) 01/18/2021   right lumpectomy   Family history of breast cancer    PCOS (polycystic ovarian syndrome)    Personal history of radiation therapy    STD (sexually transmitted disease)    hsv1   Past Surgical History:  Procedure Laterality Date   BREAST LUMPECTOMY Right 01/18/2021   BREAST LUMPECTOMY WITH RADIOACTIVE SEED LOCALIZATION Right 01/18/2021   Procedure: RIGHT BREAST LUMPECTOMY WITH RADIOACTIVE SEED LOCALIZATION X 2;  Surgeon: Jovita Kussmaul, MD;  Location: [place];  Service: General;  Laterality: Right;   CERVICAL BIOPSY  W/ LOOP ELECTRODE EXCISION  Age 48 2001   Paps have been normal eversince    COLPOSCOPY     INTRAUTERINE DEVICE (IUD) INSERTION  07/2017   Mirena    SENTINEL NODE BIOPSY Right 02/21/2021   Procedure: RIGHT SENTINEL NODE BIOPSY;  Surgeon: Jovita Kussmaul, MD;  Location: [place];  Service: General;  Laterality: Right;  60 MINUTES ROOM 5   Patient Active Problem List   Diagnosis Date Noted   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 Diagnostic mammogram and US showed 3 groups of calcifications in the right breast, largest 1.4cm, with two smaller measuring 0.1cm. 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  She had a right lumpectomy performed on 12/29/20 and is pending a SLNB on 02/21/2021.  She also had a positive genetic test.She will have adjuvant radiation and anti estrogens   PRECAUTIONS: right UE Lymphedema risk, None  SUBJECTIVE: Pt returns for her 3 month L-Dex screen.   PAIN:  Are you having pain? No  SOZO SCREENING: Patient was assessed today using the SOZO machine to determine the lymphedema index score. This was compared to her baseline score. It was determined that she is within the recommended range when compared to her baseline and no further action is needed at this time. She will continue SOZO screenings. These are done every 3 months for 2 years post operatively followed by every 6 months for 2 years, and then annually.   L-DEX FLOWSHEETS - 07/09/22 0900       L-DEX LYMPHEDEMA SCREENING   Measurement Type Unilateral    L-DEX MEASUREMENT EXTREMITY Upper Extremity    POSITION  Standing    DOMINANT SIDE Right    At Risk Side Right    BASELINE SCORE (UNILATERAL) -1.7    L-DEX  SCORE (UNILATERAL) -1.1    VALUE CHANGE (UNILAT) 0.6              Otelia Limes, PTA 07/09/2022, 9:37 AM

## 2022-07-12 ENCOUNTER — Encounter: Payer: Self-pay | Admitting: Nurse Practitioner

## 2022-07-12 ENCOUNTER — Ambulatory Visit (INDEPENDENT_AMBULATORY_CARE_PROVIDER_SITE_OTHER): Payer: 59 | Admitting: Nurse Practitioner

## 2022-07-12 VITALS — BP 106/70 | HR 70 | Resp 16 | Ht 66.0 in | Wt 153.0 lb

## 2022-07-12 DIAGNOSIS — R102 Pelvic and perineal pain: Secondary | ICD-10-CM | POA: Diagnosis not present

## 2022-07-12 DIAGNOSIS — C50411 Malignant neoplasm of upper-outer quadrant of right female breast: Secondary | ICD-10-CM | POA: Diagnosis not present

## 2022-07-12 DIAGNOSIS — Z01419 Encounter for gynecological examination (general) (routine) without abnormal findings: Secondary | ICD-10-CM | POA: Diagnosis not present

## 2022-07-12 DIAGNOSIS — Z170 Estrogen receptor positive status [ER+]: Secondary | ICD-10-CM

## 2022-07-12 NOTE — Progress Notes (Signed)
Ann Buckley 02-Jul-1981 [id number]   History:  41 y.o. G0 presents for annual exam. Complains of left sided pelvic pain x 2 months. Pain is intermittent, seems to be mid cycle and achy. No changes in bowels. This past month she experienced as episode of right sided pain that lasted about 45 minutes, she almost went to ER due to pain. Cycles ranging from 30-45 days. History of PCOS. 2001 LEEP, subsequent paps normal. 12/2020 right DCIS hormone reception positive breast cancer managed with lumpectomy, radiation and Tamoxifen (started 05/2021). Mirena IUD removed at time of diagnosis, husband has had vasectomy since removal.   Gynecologic History Patient's last menstrual period was 07/03/2022 (exact date). Period Cycle (Days):  (30-45 days) Period Pattern: Regular Menstrual Flow:  (heavy for 2 days & then light) Menstrual Control: Panty liner, Tampon Dysmenorrhea:  (some cramping,burning) Contraception/Family planning: vasectomy Sexually active: Yes  Health Maintenance Last Pap: 06/28/2021. Results were: Normal, 3-year repeat Last mammogram: 11/10/2021. Results were: Normal, breast MRI 04/27/2022 normal Last colonoscopy: Not indicated Last Dexa: Not indicated  Past medical history, past surgical history, family history and social history were all reviewed and documented in the EPIC chart. Married. HR director. 53 yo stepdaughter, 41 yo stepson. Possibly moving to [place] next summer for husband's job.   ROS:  A ROS was performed and pertinent positives and negatives are included.  Exam:  Vitals:   07/12/22 0829  BP: 106/70  Pulse: 70  Resp: 16  Weight: 153 lb (69.4 kg)  Height: '5\' 6"'$  (1.676 m)    Body mass index is 24.69 kg/m.  General appearance:  Normal Thyroid:  Symmetrical, normal in size, without palpable masses or nodularity. Respiratory  Auscultation:  Clear without wheezing or rhonchi Cardiovascular  Auscultation:  Regular rate, without rubs, murmurs or  gallops  Edema/varicosities:  Not grossly evident Abdominal  Soft,nontender, without masses, guarding or rebound.  Liver/spleen:  No organomegaly noted  Hernia:  None appreciated  Skin  Inspection:  Grossly normal Breasts: Examined lying and sitting.   Right: Without masses, retractions, nipple discharge or axillary adenopathy.   Left: Without masses, retractions, nipple discharge or axillary adenopathy. Genitourinary   Inguinal/mons:  Normal without inguinal adenopathy  External genitalia:  Normal appearing vulva with no masses, tenderness, or lesions  BUS/Urethra/Skene's glands:  Normal  Vagina:  Normal appearing with normal color and discharge, no lesions  Cervix:  Normal appearing without discharge or lesions  Uterus:  Normal in size, shape and contour.  Midline and mobile, nontender  Adnexa/parametria:     Rt: Normal in size, without masses or tenderness.   Lt: Normal in size, without masses or tenderness.  Anus and perineum: Normal  Patient informed chaperone available to be present for breast and pelvic exam. Patient has requested no chaperone to be present. Patient has been advised what will be completed during breast and pelvic exam.   Assessment/Plan:  41 y.o. G0 for annual exam.   Well female exam with routine gynecological exam - Education provided on SBEs, importance of preventative screenings, current guidelines, high calcium diet, regular exercise, and multivitamin daily.  Labs with PCP.   Malignant neoplasm of upper-outer quadrant of right breast in female, estrogen receptor positive ([place]) - 12/2020 right DCIS hormone reception positive breast cancer managed with lumpectomy, radiation and Tamoxifen (started 05/2021). UTD on breast screenings.   Pelvic pain - Plan: US PELVIS TRANSVAGINAL NON-OB (TV ONLY)  Screening for cervical cancer - 2001 LEEP - no dysplasia. Will repeat at 3-year interval  per guidelines.   Return in 1 year for annual.      Tamela Gammon  DNP, 9:14 AM 07/12/2022

## 2022-07-25 ENCOUNTER — Other Ambulatory Visit: Payer: Self-pay | Admitting: Hematology and Oncology

## 2022-07-25 DIAGNOSIS — Z1231 Encounter for screening mammogram for malignant neoplasm of breast: Secondary | ICD-10-CM

## 2022-07-31 ENCOUNTER — Other Ambulatory Visit (HOSPITAL_COMMUNITY): Payer: Self-pay | Admitting: Urology

## 2022-07-31 DIAGNOSIS — Z1289 Encounter for screening for malignant neoplasm of other sites: Secondary | ICD-10-CM

## 2022-08-02 ENCOUNTER — Ambulatory Visit (INDEPENDENT_AMBULATORY_CARE_PROVIDER_SITE_OTHER): Payer: 59 | Admitting: Nurse Practitioner

## 2022-08-02 ENCOUNTER — Ambulatory Visit (INDEPENDENT_AMBULATORY_CARE_PROVIDER_SITE_OTHER): Payer: 59

## 2022-08-02 ENCOUNTER — Encounter: Payer: Self-pay | Admitting: Nurse Practitioner

## 2022-08-02 VITALS — BP 124/80

## 2022-08-02 DIAGNOSIS — R102 Pelvic and perineal pain: Secondary | ICD-10-CM

## 2022-08-02 DIAGNOSIS — D259 Leiomyoma of uterus, unspecified: Secondary | ICD-10-CM

## 2022-08-02 NOTE — Progress Notes (Signed)
   Acute Office Visit  Subjective:    Patient ID: Ann Buckley, female    DOB: 21-May-1981, 42 y.o.   MRN: [id number]   HPI 41 y.o. presents today for ultrasound. Seen 07/12/2022 with complaints of left sided pelvic pain x 2 months. Pain is intermittent, seems to be mid cycle and achy. No changes in bowels. This past month she experienced as episode of right sided pain that lasted about 45 minutes, she almost went to ER due to pain. Cycles ranging from 30-45 days. Bleeding heavy first 2 days then is light. History of PCOS.    Review of Systems  Constitutional: Negative.   Genitourinary:  Positive for pelvic pain.       Objective:    Physical Exam Constitutional:      Appearance: Normal appearance.   GI: not indicated  BP 124/80 (BP Location: Right Arm, Patient Position: Sitting, Cuff Size: Normal)   LMP 07/03/2022 (Exact Date)  Wt Readings from Last 3 Encounters:  07/12/22 153 lb (69.4 kg)  07/09/22 156 lb (70.8 kg)  04/02/22 151 lb 4 oz (68.6 kg)        Assessment & Plan:   Problem List Items Addressed This Visit   None Visit Diagnoses     Pelvic pain    -  Primary   Pedunculated leiomyoma of uterus          Vaginal ultrasound: Comparison made with previous outside scan in 2020.   Anteverted uterus, increase in size and number of fibroids seen on previous scan.  Largest fibroid pedunculated to right side is now 10 x 6 cm (was 6 x 5 x 4 cm on previous scan).  Symmetrical endometrium - 7.6 mm.  Slightly distorted by small adjacent fibroid.  No masses seen.  Both ovaries normal size with normal follicle pattern.  2.3 x 1.8 cm resolving corpus luteum on the left side - cycle day 31.  Trace amount of free fluid.   Plan: Ultrasound report reviewed with patient. Large pedunculated fibroid likely cause for discomfort. Does not need management for heavy bleeding. Will schedule visit with Dr. Dellis Filbert to discuss surgical options.      [place],  4:42 PM 08/02/2022

## 2022-08-03 ENCOUNTER — Encounter: Payer: Self-pay | Admitting: Hematology and Oncology

## 2022-08-03 ENCOUNTER — Encounter: Payer: Self-pay | Admitting: Obstetrics & Gynecology

## 2022-08-03 ENCOUNTER — Ambulatory Visit (INDEPENDENT_AMBULATORY_CARE_PROVIDER_SITE_OTHER): Payer: 59 | Admitting: Obstetrics & Gynecology

## 2022-08-03 VITALS — BP 118/79 | HR 87 | Ht 65.75 in | Wt 154.0 lb

## 2022-08-03 DIAGNOSIS — R102 Pelvic and perineal pain unspecified side: Secondary | ICD-10-CM

## 2022-08-03 DIAGNOSIS — D259 Leiomyoma of uterus, unspecified: Secondary | ICD-10-CM

## 2022-08-03 DIAGNOSIS — Z170 Estrogen receptor positive status [ER+]: Secondary | ICD-10-CM

## 2022-08-03 DIAGNOSIS — Z1589 Genetic susceptibility to other disease: Secondary | ICD-10-CM | POA: Diagnosis not present

## 2022-08-03 DIAGNOSIS — C50411 Malignant neoplasm of upper-outer quadrant of right female breast: Secondary | ICD-10-CM

## 2022-08-03 DIAGNOSIS — Z1509 Genetic susceptibility to other malignant neoplasm: Secondary | ICD-10-CM

## 2022-08-03 NOTE — Progress Notes (Signed)
Ann Buckley 24-Feb-1981 [id number]        42 y.o.  G0 Husband Vasectomized.  RP: Counseling and management of Right pelvic pain with large pedunculated fibroid   HPI: Right pelvic pain with large pedunculated fibroid.  Menses regular with normal flow.  No BTB. 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    OB History  Gravida Para Term Preterm AB Living  0 0 0 0 0 0  SAB IAB Ectopic Multiple Live Births  0 0 0 0      Past medical history,surgical history, problem list, medications, allergies, family history and social history were all reviewed and documented in the EPIC chart.   Directed ROS with pertinent positives and negatives documented in the history of present illness/assessment and plan.  Exam:  Vitals:   08/03/22 1403  BP: 118/79  Pulse: 87  SpO2: 99%  Weight: 154 lb (69.9 kg)  Height: 5' 5.75" (1.67 m)   General appearance:  Normal  Pelvic US 08/02/2022: Vaginal ultrasound: Comparison made with previous outside scan in 2020.    Anteverted uterus, increase in size and number of fibroids seen on previous scan.   Largest fibroid pedunculated to right side is now 10 x 6 cm (was 6 x 5 x 4 cm on previous scan).  Symmetrical endometrium - 7.6 mm.  Slightly distorted by small adjacent fibroid.  No masses seen.     Both ovaries normal size with normal follicle pattern.  2.3 x 1.8 cm resolving corpus luteum on the left side - cycle day 31.     Trace amount of free fluid.  MRI Pelvis 10/28/2018: Reproductive:   Uterus: Measures 7.6 x 4.1 x 4.7 cm. Normal endometrium. Intrauterine device. The ultrasound abnormality corresponds to a subserosal 3.8 x 4.8 by 6.7 cm fibroid, projecting into the right hemipelvis including image 16/4 and image 9/3. This avidly enhances after contrast.   There is also a left-sided subserosal fibroid off the fundus at 2.2 cm on image 42/8.   The uterine architecture is atypical within  the fundus and body, including on image 18/3. Ill definition of junctional zone.   Right ovary: Identified on image 18/4. Multiple small follicles within. 3.6 x 3.7 x 3.7 cm (volume = 26 cm^3)   Left ovary: Identified on image 19/4. Multiple small follicles identified within. 3.0 x 2.2 by 2.8 cm (volume = 9.7 cm^3).   Other: No significant free fluid.    Assessment/Plan:  42 y.o. G0P0000   1. Pelvic pain Right pelvic pain with large pedunculated fibroid.  Menses regular with normal flow.  No BTB. 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  Pelvic US 08/02/2022 showed an increase in size of the Rt pedunculated fibroid from 6.7 cm in diameter on the MRI 10/2018 to 10 cm on the Pelvic US yesterday.  Other fibroids are also present.  Normal bilateral ovaries.  Endometrial line at 7.6 mm symmetrical with no mass.  Will proceed with an EBx at next visit given that patient is on Tamoxifen.  No desire to preserve fertility, husband is vasectomized.  Will proceed with XI Robotic TLH/BSO.  Patient will consult with her Oncologist to confirm that a Bilateral Oophorectomy is recommended by Onco.  Surgery and risks thoroughly reviewed with patient.  Preop preparation and post op complications and precautions reviewed.  Patient has allergy to Penicillin/Sulfa and Surgical tape.  2.  Pedunculated leiomyoma of uterus Rt pelvic pain with increase in size of the largest/pedunculated Fibroid x 2020.  Probably the cause of patient's pelvic pain on the Right.    3. 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 - F/U EBx  4. Monoallelic mutation of MITF gene                         Patient was counseled as to the risk of surgery to include the following:  1. Infection (prohylactic antibiotics will be administered)  2. DVT/Pulmonary  Embolism (prophylactic pneumo compression stockings will be used)  3.Trauma to internal organs requiring additional surgical procedure to repair any injury to internal organs requiring perhaps additional hospitalization days.  4.Hemmorhage requiring transfusion and blood products which carry risks such as   anaphylactic reaction, hepatitis and AIDS  Patient had received literature information on the procedure scheduled and all her questions were answered and fully accepts all risk.   Princess Bruins MD, 2:08 PM 08/03/2022

## 2022-08-05 ENCOUNTER — Encounter: Payer: Self-pay | Admitting: Nurse Practitioner

## 2022-08-06 ENCOUNTER — Telehealth: Payer: Self-pay | Admitting: *Deleted

## 2022-08-06 NOTE — Telephone Encounter (Signed)
-----  Message from Princess Bruins, MD sent at 08/03/2022  3:22 PM EST ----- Regarding: Schedule Surgery Surgery: CPT [phone] - XI Robotic Total Laparoscopic Hysterectomy with Bilateral Salpingectomy-Oophorectomy  Diagnosis: D25.9 Fibroids, Pelvic Pain, H/O Breast Cancer  Location: [facility]  Status: Outpatient  Time: 120 Minutes  Assistant: First Available Provider: Olivia Mackie RNFA  Urgency: First Available  Pre-Op Appointment: To Be Scheduled  Post-Op Appointment(s): 2 Weeks, 6 Weeks  Time Out Of Work: 6 Weeks

## 2022-08-08 NOTE — Telephone Encounter (Signed)
Call to patient x3. Call does not go through, verified number on file.   MyChart message to patient.

## 2022-08-09 NOTE — Telephone Encounter (Addendum)
  Spoke with patient. Surgery date request confirmed.  Advised surgery is scheduled for 10/16/22, [facility] at 0830.  Surgery instruction sheet and hospital brochure reviewed, printed copy will be mailed.  Patient verbalizes understanding and is agreeable.   Patient states advised by oncology to stop tamoxifen 7 days prior and resume 7 days after surgery.   Pre-op and EMB scheduled for 09/28/22.   Routing to provider. Encounter closed.

## 2022-09-18 ENCOUNTER — Ambulatory Visit (HOSPITAL_COMMUNITY)
Admission: RE | Admit: 2022-09-18 | Discharge: 2022-09-18 | Disposition: A | Discharge status: Home / Self Care | Payer: 59 | Source: Ambulatory Visit | Attending: Urology | Admitting: Urology

## 2022-09-18 DIAGNOSIS — Z1289 Encounter for screening for malignant neoplasm of other sites: Secondary | ICD-10-CM

## 2022-09-21 HISTORY — PX: HYSTERECTOMY, VAGINAL, WITH SALPINGO-OOPHORECTOMY: SHX7589

## 2022-09-28 ENCOUNTER — Encounter: Payer: Self-pay | Admitting: Obstetrics & Gynecology

## 2022-09-28 ENCOUNTER — Other Ambulatory Visit (HOSPITAL_COMMUNITY)
Admission: RE | Admit: 2022-09-28 | Discharge: 2022-09-28 | Disposition: A | Discharge status: Home / Self Care | Payer: 59 | Source: Ambulatory Visit | Attending: Obstetrics & Gynecology | Admitting: Obstetrics & Gynecology

## 2022-09-28 ENCOUNTER — Ambulatory Visit (INDEPENDENT_AMBULATORY_CARE_PROVIDER_SITE_OTHER): Payer: 59 | Admitting: Obstetrics & Gynecology

## 2022-09-28 VITALS — BP 122/74 | HR 84 | Ht 65.5 in | Wt 158.0 lb

## 2022-09-28 DIAGNOSIS — Z1589 Genetic susceptibility to other disease: Secondary | ICD-10-CM | POA: Diagnosis not present

## 2022-09-28 DIAGNOSIS — Z170 Estrogen receptor positive status [ER+]: Secondary | ICD-10-CM | POA: Insufficient documentation

## 2022-09-28 DIAGNOSIS — R102 Pelvic and perineal pain: Secondary | ICD-10-CM

## 2022-09-28 DIAGNOSIS — L309 Dermatitis, unspecified: Secondary | ICD-10-CM | POA: Insufficient documentation

## 2022-09-28 DIAGNOSIS — C50411 Malignant neoplasm of upper-outer quadrant of right female breast: Secondary | ICD-10-CM | POA: Diagnosis not present

## 2022-09-28 DIAGNOSIS — D259 Leiomyoma of uterus, unspecified: Secondary | ICD-10-CM | POA: Diagnosis not present

## 2022-09-28 DIAGNOSIS — Z1509 Genetic susceptibility to other malignant neoplasm: Secondary | ICD-10-CM

## 2022-09-28 HISTORY — PX: ENDOMETRIAL BIOPSY: SHX622

## 2022-09-28 NOTE — Progress Notes (Signed)
Ann Buckley 1980-10-07 [id number]        42 y.o.  G0 Husband vasectomized  RP: Preop with EBx for XL robotic assisted total hysterectomy with bilateral salpingo oophorectomy 10/16/22  HPI: Right pelvic pain with large pedunculated fibroid.  Menses regular with normal flow. No BTB. 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 XL robotic assisted total hysterectomy with bilateral salpingo oophorectomy scheduled on 10/16/22.  Endo bx today given that patient is on Tamoxifen.    OB History  Gravida Para Term Preterm AB Living  0 0 0 0 0 0  SAB IAB Ectopic Multiple Live Births  0 0 0 0      Past medical history,surgical history, problem list, medications, allergies, family history and social history were all reviewed and documented in the EPIC chart.   Directed ROS with pertinent positives and negatives documented in the history of present illness/assessment and plan.  Exam:  Vitals:   09/28/22 1020  BP: 122/74  Pulse: 84  SpO2: 100%  Weight: 158 lb (71.7 kg)  Height: 5' 5.5" (1.664 m)   General appearance:  Normal  Pelvic US 08/02/2022: Vaginal ultrasound: Comparison made with previous outside scan in 2020.    Anteverted uterus, increase in size and number of fibroids seen on previous scan.   Largest fibroid pedunculated to right side is now 10 x 6 cm (was 6 x 5 x 4 cm on previous scan).  Symmetrical endometrium - 7.6 mm.  Slightly distorted by small adjacent fibroid. No masses seen.     Both ovaries normal size with normal follicle pattern.  2.3 x 1.8 cm resolving corpus luteum on the left side - cycle day 31.     Trace amount of free fluid.   MRI Pelvis 10/28/2018: Reproductive:   Uterus: Measures 7.6 x 4.1 x 4.7 cm. Normal endometrium. Intrauterine device. The ultrasound abnormality corresponds to a subserosal 3.8 x 4.8 by 6.7 cm fibroid, projecting into the right hemipelvis including image 16/4 and image 9/3. This  avidly enhances after contrast.   There is also a left-sided subserosal fibroid off the fundus at 2.2 cm on image 42/8.   The uterine architecture is atypical within the fundus and body, including on image 18/3. Ill definition of junctional zone.   Right ovary: Identified on image 18/4. Multiple small follicles within. 3.6 x 3.7 x 3.7 cm (volume = 26 cm^3)   Left ovary: Identified on image 19/4. Multiple small follicles identified within. 3.0 x 2.2 by 2.8 cm (volume = 9.7 cm^3).   Other: No significant free fluid.    Written informed consent for EBx.  Speculum inserted in the vagina.  Betadine prep.  Hurricane spray.  Tenaculum on anterior cervical lip.  Os finder.  Endometrial Bx with negative pressure on all endometrial surfaces.  EBx canula removed. Good specimen obtained, sent to pathology.  All instruments removed.  Good hemostasis.  No Cx.  Well tolerated by patient.    Assessment/Plan:  42 y.o. G0P0000    1. Pelvic pain Right pelvic pain with large pedunculated fibroid.  Menses regular with normal flow.  No BTB. 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 Pelvic US 08/02/2022 showed an increase in size of the Rt pedunculated fibroid from 6.7 cm in diameter on the MRI 10/2018 to 10 cm on the last Pelvic US.  Other fibroids are also present.  Normal bilateral ovaries.  Endometrial line at 7.6 mm symmetrical with no mass. EBx done today given that patient is on Tamoxifen.  No desire to preserve fertility, husband is vasectomized.  Will proceed with XI Robotic TLH/BSO. Oncologist confirmed that a Bilateral Oophorectomy is recommended.  Surgery and risks thoroughly reviewed with patient.  Preop preparation and post op complications and precautions reviewed.  Patient has allergy to Penicillin/Sulfa and Surgical tape.  Will give Lovenox prophylaxis. - Endometrial biopsy - Surgical pathology( [facility]/ [facility])  2. Pedunculated leiomyoma of  uterus Rt pelvic pain with increase in size of the largest/pedunculated Fibroid x 2020.  Probably the cause of patient's pelvic pain on the Right.     3. 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  Will stop Tamoxifen 7 days before surgery and restart 7 days after.   4. Monoallelic mutation of MITF gene                          Patient was counseled as to the risk of surgery to include the following:   1. Infection (prohylactic antibiotics will be administered)   2. DVT/Pulmonary Embolism (prophylactic pneumo compression stockings will be used)   3.Trauma to internal organs requiring additional surgical procedure to repair any injury to internal organs requiring perhaps additional hospitalization days.   4.Hemmorhage requiring transfusion and blood products which carry risks such as   anaphylactic reaction, hepatitis and AIDS   Patient had received literature information on the procedure scheduled and all her questions were answered and fully accepts all risk.    Princess Bruins MD, 10:45 AM 09/28/2022

## 2022-10-02 LAB — SURGICAL PATHOLOGY

## 2022-10-08 ENCOUNTER — Encounter (HOSPITAL_BASED_OUTPATIENT_CLINIC_OR_DEPARTMENT_OTHER): Payer: Self-pay | Admitting: Obstetrics & Gynecology

## 2022-10-09 ENCOUNTER — Encounter (HOSPITAL_BASED_OUTPATIENT_CLINIC_OR_DEPARTMENT_OTHER): Payer: Self-pay | Admitting: Obstetrics & Gynecology

## 2022-10-09 ENCOUNTER — Other Ambulatory Visit: Payer: Self-pay

## 2022-10-09 NOTE — Progress Notes (Signed)
Your procedure is scheduled on Tuesday, 10/16/2022.  Report to [place] AT  6:30 AM.   Call this number if you have problems the morning of surgery  :[phone].   OUR ADDRESS IS [address].  WE ARE LOCATED IN [facility].  PLEASE BRING YOUR INSURANCE CARD AND PHOTO ID DAY OF SURGERY.  ONLY 2 PEOPLE ARE ALLOWED IN  WAITING  ROOM / CURRENTLY NO ONE UNDER AGE 43                                     REMEMBER:  DO NOT EAT FOOD, CANDY GUM OR MINTS  AFTER MIDNIGHT THE NIGHT BEFORE YOUR SURGERY . YOU MAY HAVE CLEAR LIQUIDS FROM MIDNIGHT THE NIGHT BEFORE YOUR SURGERY UNTIL  5:30 AM. NO CLEAR LIQUIDS AFTER   5:30 AM DAY OF SURGERY.  YOU MAY  BRUSH YOUR TEETH MORNING OF SURGERY AND RINSE YOUR MOUTH OUT, NO CHEWING GUM CANDY OR MINTS.     CLEAR LIQUID DIET    Allowed      Water                                                                   Coffee and tea, regular and decaf  (NO cream or milk products of any type, may sweeten)                         Carbonated beverages, regular and diet                                    Sports drinks like Gatorade _____________________________________________________________________     TAKE ONLY THESE MEDICATIONS MORNING OF SURGERY: Xanax if needed, Flonase if needed    UP TO 4 VISITORS  MAY VISIT IN THE EXTENDED RECOVERY ROOM UNTIL 800 PM ONLY.  ONE  VISITOR AGE 2 AND OVER MAY SPEND THE NIGHT AND MUST BE IN EXTENDED RECOVERY ROOM NO LATER THAN 800 PM . YOUR DISCHARGE TIME AFTER YOU SPEND THE NIGHT IS 900 AM THE MORNING AFTER YOUR SURGERY.  YOU MAY PACK A SMALL OVERNIGHT BAG WITH TOILETRIES FOR YOUR OVERNIGHT STAY IF YOU WISH.  YOUR PRESCRIPTION MEDICATIONS WILL BE PROVIDED DURING [place].                                      DO NOT WEAR JEWERLY/  METAL/  PIERCINGS (INCLUDING NO PLASTIC PIERCINGS) DO NOT WEAR LOTIONS, POWDERS, PERFUMES OR NAIL POLISH ON YOUR FINGERNAILS. TOENAIL  POLISH IS OK TO WEAR. DO NOT SHAVE FOR 48 HOURS PRIOR TO DAY OF SURGERY.  CONTACTS, GLASSES, OR DENTURES MAY NOT BE WORN TO SURGERY.  REMEMBER: NO SMOKING, VAPING ,  DRUGS OR ALCOHOL FOR 24 HOURS BEFORE YOUR SURGERY.  [facility] IS NOT RESPONSIBLE  FOR ANY BELONGINGS.                                                                    Marland Kitchen           [facility] - Preparing for Surgery Before surgery, you can play an important role.  Because skin is not sterile, your skin needs to be as free of germs as possible.  You can reduce the number of germs on your skin by washing with CHG (chlorahexidine gluconate) soap before surgery.  CHG is an antiseptic cleaner which kills germs and bonds with the skin to continue killing germs even after washing. Please DO NOT use if you have an allergy to CHG or antibacterial soaps.  If your skin becomes reddened/irritated stop using the CHG and inform your nurse when you arrive at Short Stay. Do not shave (including legs and underarms) for at least 48 hours prior to the first CHG shower.  You may shave your face/neck. Please follow these instructions carefully:  1.  Shower with CHG Soap the night before surgery and the  morning of Surgery.  2.  If you choose to wash your hair, wash your hair first as usual with your  normal  shampoo.  3.  After you shampoo, rinse your hair and body thoroughly to remove the  shampoo.                                        4.  Use CHG as you would any other liquid soap.  You can apply chg directly  to the skin and wash , chg soap provided, night before and morning of your surgery.  5.  Apply the CHG Soap to your body ONLY FROM THE NECK DOWN.   Do not use on face/ open                           Wound or open sores. Avoid contact with eyes, ears mouth and genitals (private parts).                       Wash face,  Genitals (private parts) with your normal soap.             6.  Wash thoroughly, paying  special attention to the area where your surgery  will be performed.  7.  Thoroughly rinse your body with warm water from the neck down.  8.  DO NOT shower/wash with your normal soap after using and rinsing off  the CHG Soap.             9.  Pat yourself dry with a clean towel.            10.  Wear clean pajamas.            11.  Place clean sheets on your bed the night of your first shower and do not  sleep with pets. Day of Surgery : Do not apply any lotions/ powders the morning of surgery.  Please wear clean clothes to the hospital/surgery  center.  IF YOU HAVE ANY SKIN IRRITATION OR PROBLEMS WITH THE SURGICAL SOAP, PLEASE GET A BAR OF GOLD DIAL SOAP AND SHOWER THE NIGHT BEFORE YOUR SURGERY AND THE MORNING OF YOUR SURGERY. PLEASE LET THE NURSE KNOW MORNING OF YOUR SURGERY IF YOU HAD ANY PROBLEMS WITH THE SURGICAL SOAP.   ________________________________________________________________________                                                        QUESTIONS [place] [facility] PRE OP NURSE PHONE [phone].

## 2022-10-09 NOTE — Progress Notes (Signed)
Spoke w/ via phone for pre-op interview---[facility] Lab needs dos----urine pregnancy               Lab results------10/10/22 lab appt for cbc, type & screen, bmp, serum creatinine COVID test -----patient states asymptomatic no test needed Arrive at -------0630 on Tuesday, 10/16/22 NPO after MN NO Solid Food.  Clear liquids from MN until---0530 Med rec completed Medications to take morning of surgery -----Xanax prn, Flonase prn Diabetic medication -----n/a Patient instructed no nail polish to be worn day of surgery Patient instructed to bring photo id and insurance card day of surgery Patient aware to have Driver (ride ) / caregiver    for 24 hours after surgery - husband, [facility] Patient Special Instructions -----Extended/ overnight stay instructions given.Use Gold Dial Soap instead of Chlorhexidine to prep for surgery due to allergy. Pre-Op special Istructions -----none Patient verbalized understanding of instructions that were given at this phone interview. Patient denies shortness of breath, chest pain, fever, cough at this phone interview.

## 2022-10-10 ENCOUNTER — Encounter (HOSPITAL_COMMUNITY)
Admission: RE | Admit: 2022-10-10 | Discharge: 2022-10-10 | Disposition: A | Discharge status: Home / Self Care | Payer: 59 | Source: Ambulatory Visit | Attending: Obstetrics & Gynecology | Admitting: Obstetrics & Gynecology

## 2022-10-10 DIAGNOSIS — Z01812 Encounter for preprocedural laboratory examination: Secondary | ICD-10-CM | POA: Diagnosis not present

## 2022-10-10 DIAGNOSIS — Z01818 Encounter for other preprocedural examination: Secondary | ICD-10-CM

## 2022-10-10 LAB — BASIC METABOLIC PANEL
Anion gap: 7 (ref 5–15)
BUN: 15 mg/dL (ref 6–20)
CO2: 23 mmol/L (ref 22–32)
Calcium: 9.1 mg/dL (ref 8.9–10.3)
Chloride: 108 mmol/L (ref 98–111)
Creatinine, Ser: 0.74 mg/dL (ref 0.44–1.00)
GFR, Estimated: >60 mL/min (ref >60)
Glucose, Bld: 90 mg/dL (ref 70–99)
Potassium: 3.9 mmol/L (ref 3.5–5.1)
Sodium: 138 mmol/L (ref 135–145)

## 2022-10-10 LAB — CBC
HCT: 39.4 % (ref 36.0–46.0)
Hemoglobin: 12.7 g/dL (ref 12.0–15.0)
MCH: 31.9 pg (ref 26.0–34.0)
MCHC: 32.2 g/dL (ref 30.0–36.0)
MCV: 99 fL (ref 80.0–100.0)
Platelets: 333 10*3/uL (ref 150–400)
RBC: 3.98 MIL/uL (ref 3.87–5.11)
RDW: 12.4 % (ref 11.5–15.5)
WBC: 5.1 10*3/uL (ref 4.0–10.5)
nRBC: 0 % (ref 0.0–0.2)

## 2022-10-15 ENCOUNTER — Ambulatory Visit: Payer: 59 | Attending: General Surgery

## 2022-10-15 VITALS — Wt 161.1 lb

## 2022-10-15 DIAGNOSIS — Z483 Aftercare following surgery for neoplasm: Secondary | ICD-10-CM

## 2022-10-15 NOTE — Therapy (Signed)
OUTPATIENT PHYSICAL THERAPY SOZO SCREENING NOTE   Patient Name: Ann Buckley MRN: [id number] DOB:06-Apr-1981, 42 y.o., female Today's Date: 10/15/2022  PCP: Merrilee Seashore, MD REFERRING PROVIDER: Jovita Kussmaul, MD   PT End of Session - 10/15/22 1628     Visit Number 2   # unchanged due to screen only   PT Start Time 1627    PT Stop Time 1630    PT Time Calculation (min) 3 min    Activity Tolerance Patient tolerated treatment well    Behavior During Therapy [facility] for tasks assessed/performed             Past Medical History:  Diagnosis Date   Abnormal Pap smear of cervix 2001   Anxiety    Follows w/ PCP, Dr. Ashby Dawes.   Breast cancer ([place]) 01/18/2021   DCIS, right lumpectomy, Follows with Dr. Idelle Leech, oncologist.   Family history of breast cancer    PCOS (polycystic ovarian syndrome)    Personal history of radiation therapy    right breast, 04/04/21 - 05/17/21   STD (sexually transmitted disease)    hsv1   Uterine fibroid    Wears contact lenses    Wears glasses    Past Surgical History:  Procedure Laterality Date   BREAST LUMPECTOMY Right 01/18/2021   BREAST LUMPECTOMY WITH RADIOACTIVE SEED LOCALIZATION Right 01/18/2021   Procedure: RIGHT BREAST LUMPECTOMY WITH RADIOACTIVE SEED LOCALIZATION X 2;  Surgeon: Jovita Kussmaul, MD;  Location: [place];  Service: General;  Laterality: Right;   CERVICAL BIOPSY  W/ LOOP ELECTRODE EXCISION  Age 30 2001   Paps have been normal eversince    COLPOSCOPY     ENDOMETRIAL BIOPSY  09/28/2022   INTRAUTERINE DEVICE (IUD) INSERTION  07/2017   Mirena    SENTINEL NODE BIOPSY Right 02/21/2021   Procedure: RIGHT SENTINEL NODE BIOPSY;  Surgeon: Jovita Kussmaul, MD;  Location: [place];  Service: General;  Laterality: Right;  [place] 5   Patient Active Problem List   Diagnosis Date Noted   Eczema 09/28/2022   Melasma 09/28/2021   Seborrheic dermatitis of scalp [id number]   Monoallelic  mutation of MITF gene 12/29/2020   Genetic testing 12/20/2020   Family history of breast cancer    Malignant neoplasm of upper outer quadrant of female breast ([place]) 12/09/2020    REFERRING DIAG: right breast cancer at risk for lymphedema  THERAPY DIAG: Aftercare following surgery for neoplasm  PERTINENT HISTORY: Screening mammogram showed right breast calcifications. Diagnostic mammogram and US showed 3 groups of calcifications in the right breast, largest 1.4cm, with two smaller measuring 0.1cm. 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  She had a right lumpectomy performed on 12/29/20 and is pending a SLNB on 02/21/2021.  She also had a positive genetic test.She will have adjuvant radiation and anti estrogens   PRECAUTIONS: right UE Lymphedema risk, None  SUBJECTIVE: Pt returns for her 3 month L-Dex screen.   PAIN:  Are you having pain? No  SOZO SCREENING: Patient was assessed today using the SOZO machine to determine the lymphedema index score. This was compared to her baseline score. It was determined that she is within the recommended range when compared to her baseline and no further action is needed at this time. She will continue SOZO screenings. These are done every 3 months for 2 years post operatively followed by every 6 months for 2 years, and then annually.  L-DEX FLOWSHEETS - 10/15/22 1600       L-DEX LYMPHEDEMA SCREENING   Measurement Type Unilateral    L-DEX MEASUREMENT EXTREMITY Upper Extremity    POSITION  Standing    DOMINANT SIDE Right    At Risk Side Right    BASELINE SCORE (UNILATERAL) -1.7    L-DEX SCORE (UNILATERAL) -3.9    VALUE CHANGE (UNILAT) -2.2              Otelia Limes, PTA 10/15/2022, 4:30 PM

## 2022-10-16 ENCOUNTER — Ambulatory Visit (HOSPITAL_BASED_OUTPATIENT_CLINIC_OR_DEPARTMENT_OTHER): Payer: 59 | Admitting: Certified Registered"

## 2022-10-16 ENCOUNTER — Ambulatory Visit (HOSPITAL_BASED_OUTPATIENT_CLINIC_OR_DEPARTMENT_OTHER)
Admission: RE | Admit: 2022-10-16 | Discharge: 2022-10-16 | Disposition: A | Discharge status: Home / Self Care | Payer: 59 | Attending: Obstetrics & Gynecology | Admitting: Obstetrics & Gynecology

## 2022-10-16 ENCOUNTER — Other Ambulatory Visit: Payer: Self-pay

## 2022-10-16 ENCOUNTER — Encounter (HOSPITAL_BASED_OUTPATIENT_CLINIC_OR_DEPARTMENT_OTHER): Payer: Self-pay | Admitting: Obstetrics & Gynecology

## 2022-10-16 ENCOUNTER — Encounter (HOSPITAL_BASED_OUTPATIENT_CLINIC_OR_DEPARTMENT_OTHER)
Admission: RE | Disposition: A | Discharge status: Home / Self Care | Payer: Self-pay | Source: Home / Self Care | Attending: Obstetrics & Gynecology

## 2022-10-16 DIAGNOSIS — Z170 Estrogen receptor positive status [ER+]: Secondary | ICD-10-CM

## 2022-10-16 DIAGNOSIS — D259 Leiomyoma of uterus, unspecified: Secondary | ICD-10-CM

## 2022-10-16 DIAGNOSIS — Z79810 Long term (current) use of selective estrogen receptor modulators (SERMs): Secondary | ICD-10-CM | POA: Diagnosis not present

## 2022-10-16 DIAGNOSIS — Z853 Personal history of malignant neoplasm of breast: Secondary | ICD-10-CM | POA: Diagnosis not present

## 2022-10-16 DIAGNOSIS — C50911 Malignant neoplasm of unspecified site of right female breast: Secondary | ICD-10-CM | POA: Diagnosis not present

## 2022-10-16 DIAGNOSIS — R102 Pelvic and perineal pain: Secondary | ICD-10-CM

## 2022-10-16 DIAGNOSIS — F419 Anxiety disorder, unspecified: Secondary | ICD-10-CM

## 2022-10-16 DIAGNOSIS — Z01818 Encounter for other preprocedural examination: Secondary | ICD-10-CM

## 2022-10-16 DIAGNOSIS — C50411 Malignant neoplasm of upper-outer quadrant of right female breast: Secondary | ICD-10-CM

## 2022-10-16 DIAGNOSIS — Z9889 Other specified postprocedural states: Secondary | ICD-10-CM

## 2022-10-16 HISTORY — DX: Presence of spectacles and contact lenses: Z97.3

## 2022-10-16 HISTORY — DX: Leiomyoma of uterus, unspecified: D25.9

## 2022-10-16 HISTORY — PX: ROBOTIC ASSISTED TOTAL HYSTERECTOMY WITH BILATERAL SALPINGO OOPHERECTOMY: SHX6086

## 2022-10-16 LAB — CBC
HCT: 40.7 % (ref 36.0–46.0)
Hemoglobin: 13.1 g/dL (ref 12.0–15.0)
MCH: 32.3 pg (ref 26.0–34.0)
MCHC: 32.2 g/dL (ref 30.0–36.0)
MCV: 100.5 fL — ABNORMAL HIGH (ref 80.0–100.0)
Platelets: 315 10*3/uL (ref 150–400)
RBC: 4.05 MIL/uL (ref 3.87–5.11)
RDW: 12.5 % (ref 11.5–15.5)
WBC: 13.1 10*3/uL — ABNORMAL HIGH (ref 4.0–10.5)
nRBC: 0 % (ref 0.0–0.2)

## 2022-10-16 LAB — ABO/RH: ABO/RH(D): A POS

## 2022-10-16 LAB — TYPE AND SCREEN
ABO/RH(D): A POS
Antibody Screen: NEGATIVE

## 2022-10-16 LAB — POCT PREGNANCY, URINE: Preg Test, Ur: NEGATIVE

## 2022-10-16 SURGERY — HYSTERECTOMY, TOTAL, ROBOT-ASSISTED, LAPAROSCOPIC, WITH BILATERAL SALPINGO-OOPHORECTOMY
Anesthesia: General | Site: Abdomen | Laterality: Bilateral

## 2022-10-16 MED ORDER — KETAMINE HCL 50 MG/5ML IJ SOSY
PREFILLED_SYRINGE | INTRAMUSCULAR | Status: AC
  Filled 2022-10-16: qty 5

## 2022-10-16 MED ORDER — ONDANSETRON 4 MG PO TBDP
4.0000 mg | ORAL_TABLET | Freq: Once | ORAL | Status: AC
  Administered 2022-10-16: 4 mg via ORAL

## 2022-10-16 MED ORDER — LIDOCAINE 2% (20 MG/ML) 5 ML SYRINGE
INTRAMUSCULAR | Status: DC | PRN
  Administered 2022-10-16: 60 mg via INTRAVENOUS
  Administered 2022-10-16: 1.5 mg/kg/h via INTRAVENOUS

## 2022-10-16 MED ORDER — AMISULPRIDE (ANTIEMETIC) 5 MG/2ML IV SOLN: 10.0000 mg | Freq: Once | INTRAVENOUS | Status: DC | PRN

## 2022-10-16 MED ORDER — CLINDAMYCIN PHOSPHATE 900 MG/50ML IV SOLN
900.0000 mg | INTRAVENOUS | Status: AC
  Administered 2022-10-16: 900 mg via INTRAVENOUS

## 2022-10-16 MED ORDER — ONDANSETRON 4 MG PO TBDP
ORAL_TABLET | ORAL | Status: AC
  Filled 2022-10-16: qty 1

## 2022-10-16 MED ORDER — DEXAMETHASONE SODIUM PHOSPHATE 10 MG/ML IJ SOLN
INTRAMUSCULAR | Status: AC
  Filled 2022-10-16: qty 1

## 2022-10-16 MED ORDER — HYDROMORPHONE HCL 2 MG/ML IJ SOLN
INTRAMUSCULAR | Status: AC
  Filled 2022-10-16: qty 1

## 2022-10-16 MED ORDER — KETAMINE HCL 10 MG/ML IJ SOLN
INTRAMUSCULAR | Status: DC | PRN
  Administered 2022-10-16 (×5): 10 mg via INTRAVENOUS

## 2022-10-16 MED ORDER — LACTATED RINGERS IV SOLN: INTRAVENOUS | Status: DC

## 2022-10-16 MED ORDER — ROCURONIUM BROMIDE 10 MG/ML (PF) SYRINGE
PREFILLED_SYRINGE | INTRAVENOUS | Status: AC
  Filled 2022-10-16: qty 10

## 2022-10-16 MED ORDER — STERILE WATER FOR IRRIGATION IR SOLN
Status: DC | PRN
  Administered 2022-10-16: 500 mL

## 2022-10-16 MED ORDER — ENOXAPARIN SODIUM 40 MG/0.4ML IJ SOSY
PREFILLED_SYRINGE | INTRAMUSCULAR | Status: AC
  Filled 2022-10-16: qty 0.4

## 2022-10-16 MED ORDER — DEXAMETHASONE SODIUM PHOSPHATE 10 MG/ML IJ SOLN
INTRAMUSCULAR | Status: DC | PRN
  Administered 2022-10-16: 10 mg via INTRAVENOUS

## 2022-10-16 MED ORDER — ONDANSETRON HCL 4 MG/2ML IJ SOLN
INTRAMUSCULAR | Status: DC | PRN
  Administered 2022-10-16: 4 mg via INTRAVENOUS

## 2022-10-16 MED ORDER — DIPHENHYDRAMINE HCL 50 MG/ML IJ SOLN
INTRAMUSCULAR | Status: AC
  Filled 2022-10-16: qty 1

## 2022-10-16 MED ORDER — OXYCODONE-ACETAMINOPHEN 5-325 MG PO TABS
2.0000 | ORAL_TABLET | ORAL | Status: DC | PRN
  Administered 2022-10-16: 2 via ORAL

## 2022-10-16 MED ORDER — PROMETHAZINE HCL 25 MG/ML IJ SOLN: 6.2500 mg | INTRAMUSCULAR | Status: DC | PRN

## 2022-10-16 MED ORDER — PROPOFOL 10 MG/ML IV BOLUS
INTRAVENOUS | Status: AC
  Filled 2022-10-16: qty 20

## 2022-10-16 MED ORDER — FENTANYL CITRATE (PF) 100 MCG/2ML IJ SOLN
INTRAMUSCULAR | Status: AC
  Filled 2022-10-16: qty 2

## 2022-10-16 MED ORDER — ACETAMINOPHEN 325 MG PO TABS: 650.0000 mg | ORAL_TABLET | ORAL | Status: DC | PRN

## 2022-10-16 MED ORDER — FENTANYL CITRATE (PF) 100 MCG/2ML IJ SOLN
INTRAMUSCULAR | Status: DC | PRN
  Administered 2022-10-16 (×2): 50 ug via INTRAVENOUS

## 2022-10-16 MED ORDER — LIDOCAINE HCL (PF) 2 % IJ SOLN
INTRAMUSCULAR | Status: AC
  Filled 2022-10-16: qty 10

## 2022-10-16 MED ORDER — HYDROCODONE-ACETAMINOPHEN 5-325 MG PO TABS
1.0000 | ORAL_TABLET | ORAL | Status: DC | PRN
  Administered 2022-10-16: 2 via ORAL

## 2022-10-16 MED ORDER — HYDROCODONE-ACETAMINOPHEN 5-325 MG PO TABS
ORAL_TABLET | ORAL | Status: AC
  Filled 2022-10-16: qty 2

## 2022-10-16 MED ORDER — PROPOFOL 10 MG/ML IV BOLUS
INTRAVENOUS | Status: DC | PRN
  Administered 2022-10-16: 150 mg via INTRAVENOUS

## 2022-10-16 MED ORDER — SUGAMMADEX SODIUM 200 MG/2ML IV SOLN
INTRAVENOUS | Status: DC | PRN
  Administered 2022-10-16: 200 mg via INTRAVENOUS

## 2022-10-16 MED ORDER — ENOXAPARIN SODIUM 40 MG/0.4ML IJ SOSY
40.0000 mg | PREFILLED_SYRINGE | Freq: Once | INTRAMUSCULAR | Status: AC
  Administered 2022-10-16: 40 mg via SUBCUTANEOUS

## 2022-10-16 MED ORDER — OXYCODONE HCL 5 MG/5ML PO SOLN: 5.0000 mg | Freq: Once | ORAL | Status: DC | PRN

## 2022-10-16 MED ORDER — MEPERIDINE HCL 25 MG/ML IJ SOLN: 6.2500 mg | INTRAMUSCULAR | Status: DC | PRN

## 2022-10-16 MED ORDER — ONDANSETRON HCL 4 MG/2ML IJ SOLN
INTRAMUSCULAR | Status: AC
  Filled 2022-10-16: qty 2

## 2022-10-16 MED ORDER — OXYCODONE HCL 5 MG PO TABS: 5.0000 mg | ORAL_TABLET | Freq: Once | ORAL | Status: DC | PRN

## 2022-10-16 MED ORDER — DIPHENHYDRAMINE HCL 50 MG/ML IJ SOLN
50.0000 mg | Freq: Once | INTRAMUSCULAR | Status: AC
  Administered 2022-10-16 (×2): 25 mg via INTRAVENOUS

## 2022-10-16 MED ORDER — POVIDONE-IODINE 10 % EX SWAB: 2.0000 | Freq: Once | CUTANEOUS | Status: DC

## 2022-10-16 MED ORDER — CLINDAMYCIN PHOSPHATE 900 MG/50ML IV SOLN
INTRAVENOUS | Status: AC
  Filled 2022-10-16: qty 50

## 2022-10-16 MED ORDER — SODIUM CHLORIDE 0.9 % IR SOLN
Status: DC | PRN
  Administered 2022-10-16: 1000 mL

## 2022-10-16 MED ORDER — HYDROMORPHONE HCL 1 MG/ML IJ SOLN
0.2500 mg | INTRAMUSCULAR | Status: DC | PRN
  Administered 2022-10-16 (×2): 0.25 mg via INTRAVENOUS

## 2022-10-16 MED ORDER — OXYCODONE-ACETAMINOPHEN 5-325 MG PO TABS
ORAL_TABLET | ORAL | Status: AC
  Filled 2022-10-16: qty 2

## 2022-10-16 MED ORDER — ROCURONIUM BROMIDE 10 MG/ML (PF) SYRINGE
PREFILLED_SYRINGE | INTRAVENOUS | Status: DC | PRN
  Administered 2022-10-16: 10 mg via INTRAVENOUS
  Administered 2022-10-16: 20 mg via INTRAVENOUS
  Administered 2022-10-16: 10 mg via INTRAVENOUS
  Administered 2022-10-16: 80 mg via INTRAVENOUS
  Administered 2022-10-16: 20 mg via INTRAVENOUS

## 2022-10-16 MED ORDER — OXYCODONE-ACETAMINOPHEN 7.5-325 MG PO TABS: 1.0000 | ORAL_TABLET | Freq: Four times a day (QID) | ORAL | 0 refills | Status: AC | PRN

## 2022-10-16 MED ORDER — MIDAZOLAM HCL 5 MG/5ML IJ SOLN
INTRAMUSCULAR | Status: DC | PRN
  Administered 2022-10-16: 2 mg via INTRAVENOUS

## 2022-10-16 MED ORDER — HYDROMORPHONE HCL 1 MG/ML IJ SOLN
INTRAMUSCULAR | Status: AC
  Filled 2022-10-16: qty 1

## 2022-10-16 MED ORDER — HYDROMORPHONE HCL 1 MG/ML IJ SOLN
INTRAMUSCULAR | Status: DC | PRN
  Administered 2022-10-16 (×2): .5 mg via INTRAVENOUS
  Administered 2022-10-16: 1 mg via INTRAVENOUS

## 2022-10-16 MED ORDER — BUPIVACAINE HCL (PF) 0.25 % IJ SOLN
INTRAMUSCULAR | Status: DC | PRN
  Administered 2022-10-16: 18 mL

## 2022-10-16 MED ORDER — MIDAZOLAM HCL 2 MG/2ML IJ SOLN
INTRAMUSCULAR | Status: AC
  Filled 2022-10-16: qty 2

## 2022-10-16 MED ORDER — GENTAMICIN SULFATE 40 MG/ML IJ SOLN
5.0000 mg/kg | INTRAVENOUS | Status: AC
  Administered 2022-10-16: 360 mg via INTRAVENOUS
  Filled 2022-10-16: qty 9

## 2022-10-16 SURGICAL SUPPLY — 63 items
ADH SKN CLS APL DERMABOND .7 (GAUZE/BANDAGES/DRESSINGS) ×1
BARRIER ADHS 3X4 INTERCEED (GAUZE/BANDAGES/DRESSINGS) IMPLANT
BRR ADH 4X3 ABS CNTRL BYND (GAUZE/BANDAGES/DRESSINGS)
CATH FOLEY 3WAY  5CC 16FR (CATHETERS) ×1
CATH FOLEY 3WAY 5CC 16FR (CATHETERS) ×1 IMPLANT
COVER BACK TABLE 60X90IN (DRAPES) ×1 IMPLANT
COVER TIP SHEARS 8 DVNC (MISCELLANEOUS) ×1 IMPLANT
COVER TIP SHEARS 8MM DA VINCI (MISCELLANEOUS) ×1
DEFOGGER SCOPE WARMER CLEARIFY (MISCELLANEOUS) ×1 IMPLANT
DERMABOND ADVANCED .7 DNX12 (GAUZE/BANDAGES/DRESSINGS) ×1 IMPLANT
DRAPE ARM DVNC X/XI (DISPOSABLE) ×4 IMPLANT
DRAPE COLUMN DVNC XI (DISPOSABLE) ×1 IMPLANT
DRAPE DA VINCI XI ARM (DISPOSABLE) ×4
DRAPE DA VINCI XI COLUMN (DISPOSABLE) ×1
DRAPE SURG IRRIG POUCH 19X23 (DRAPES) ×1 IMPLANT
DRAPE UTILITY XL STRL (DRAPES) ×1 IMPLANT
DURAPREP 26ML APPLICATOR (WOUND CARE) ×1 IMPLANT
ELECT REM PT RETURN 9FT ADLT (ELECTROSURGICAL) ×1
ELECTRODE REM PT RTRN 9FT ADLT (ELECTROSURGICAL) ×1 IMPLANT
GAUZE 4X4 16PLY ~~LOC~~+RFID DBL (SPONGE) IMPLANT
GAUZE VASELINE 3X9 (GAUZE/BANDAGES/DRESSINGS) ×1 IMPLANT
GLOVE BIO SURGEON STRL SZ 6.5 (GLOVE) ×3 IMPLANT
GLOVE BIOGEL PI IND STRL 6 (GLOVE) IMPLANT
GLOVE BIOGEL PI IND STRL 6.5 (GLOVE) IMPLANT
GLOVE BIOGEL PI IND STRL 7.0 (GLOVE) ×3 IMPLANT
GLOVE ECLIPSE 6.5 STRL STRAW (GLOVE) IMPLANT
GLOVE SURG SS PI 6.5 STRL IVOR (GLOVE) IMPLANT
HOLDER FOLEY CATH W/STRAP (MISCELLANEOUS) IMPLANT
IRRIG SUCT STRYKERFLOW 2 WTIP (MISCELLANEOUS) ×1
IRRIGATION SUCT STRKRFLW 2 WTP (MISCELLANEOUS) ×1 IMPLANT
IV NS 1000ML (IV SOLUTION) ×1
IV NS 1000ML BAXH (IV SOLUTION) IMPLANT
KIT PINK PAD W/HEAD ARE REST (MISCELLANEOUS) ×1
KIT PINK PAD W/HEAD ARM REST (MISCELLANEOUS) ×1 IMPLANT
KIT TURNOVER CYSTO (KITS) ×1 IMPLANT
LEGGING LITHOTOMY PAIR STRL (DRAPES) ×1 IMPLANT
NDL HYPO 18GX1.5 BLUNT FILL (NEEDLE) IMPLANT
NEEDLE HYPO 18GX1.5 BLUNT FILL (NEEDLE) ×1 IMPLANT
OBTURATOR OPTICAL STANDARD 8MM (TROCAR) ×1
OBTURATOR OPTICAL STND 8 DVNC (TROCAR) ×1
OBTURATOR OPTICALSTD 8 DVNC (TROCAR) ×1 IMPLANT
OCCLUDER COLPOPNEUMO (BALLOONS) ×1 IMPLANT
PACK ROBOT WH (CUSTOM PROCEDURE TRAY) ×1 IMPLANT
PACK ROBOTIC GOWN (GOWN DISPOSABLE) ×1 IMPLANT
PAD OB MATERNITY 4.3X12.25 (PERSONAL CARE ITEMS) ×1 IMPLANT
PAD PREP 24X48 CUFFED NSTRL (MISCELLANEOUS) ×1 IMPLANT
SEAL UNIV 5-12 XI (MISCELLANEOUS) ×4 IMPLANT
SEAL XI UNIVERSAL 5-12 (MISCELLANEOUS) ×4
SET IRRIG Y TYPE TUR BLADDER L (SET/KITS/TRAYS/PACK) IMPLANT
SET TRI-LUMEN FLTR TB AIRSEAL (TUBING) ×1 IMPLANT
SLEEVE SCD COMPRESS KNEE MED (STOCKING) ×1 IMPLANT
SPIKE FLUID TRANSFER (MISCELLANEOUS) ×2 IMPLANT
SUT ETHIBOND 0 (SUTURE) IMPLANT
SUT VIC AB 4-0 PS2 27 (SUTURE) ×3 IMPLANT
SUT VICRYL 0 UR6 27IN ABS (SUTURE) ×1 IMPLANT
SUT VLOC 180 0 9IN  GS21 (SUTURE) ×1
SUT VLOC 180 0 9IN GS21 (SUTURE) ×1 IMPLANT
SUT VLOC 180 2-0 6IN GS21 (SUTURE) IMPLANT
SYR 10ML LL (SYRINGE) IMPLANT
TIP UTERINE 6.7X8CM BLUE DISP (MISCELLANEOUS) IMPLANT
TOWEL OR 17X24 6PK STRL BLUE (TOWEL DISPOSABLE) ×1 IMPLANT
TROCAR PORT AIRSEAL 8X120 (TROCAR) ×1 IMPLANT
WATER STERILE IRR 500ML POUR (IV SOLUTION) IMPLANT

## 2022-10-16 NOTE — Progress Notes (Signed)
Dr Dellis Filbert spoke with patient over the phone.  Patient will be discharged home.  AVS discussed with patient and husband.

## 2022-10-16 NOTE — H&P (Signed)
Ann Buckley is an 42 y.o. female. G79 Husband vasectomized   RP: XL robotic assisted total hysterectomy with bilateral salpingo oophorectomy    HPI: Right pelvic pain with large pedunculated fibroid.  Menses regular with normal flow. No BTB. 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  EBx Benign. XL robotic assisted total hysterectomy with bilateral salpingo oophorectomy.    Pertinent Gynecological History: Menses: flow is moderate Contraception: vasectomy Blood transfusions: none Sexually transmitted diseases: no past history Last pap: normal.  H/O LEEP.   Menstrual History: Patient's last menstrual period was 10/08/2022 (exact date).    Past Medical History:  Diagnosis Date   Abnormal Pap smear of cervix 2001   Anxiety    Follows w/ PCP, Dr. Ashby Dawes.   Breast cancer ([place]) 01/18/2021   DCIS, right lumpectomy, Follows with Dr. Idelle Leech, oncologist.   Family history of breast cancer    PCOS (polycystic ovarian syndrome)    Personal history of radiation therapy    right breast, 04/04/21 - 05/17/21   STD (sexually transmitted disease)    hsv1   Uterine fibroid    Wears contact lenses    Wears glasses     Past Surgical History:  Procedure Laterality Date   BREAST LUMPECTOMY Right 01/18/2021   BREAST LUMPECTOMY WITH RADIOACTIVE SEED LOCALIZATION Right 01/18/2021   Procedure: RIGHT BREAST LUMPECTOMY WITH RADIOACTIVE SEED LOCALIZATION X 2;  Surgeon: Jovita Kussmaul, MD;  Location: [place];  Service: General;  Laterality: Right;   CERVICAL BIOPSY  W/ LOOP ELECTRODE EXCISION  Age 55 2001   Paps have been normal eversince    COLPOSCOPY     ENDOMETRIAL BIOPSY  09/28/2022   INTRAUTERINE DEVICE (IUD) INSERTION  07/2017   Mirena    SENTINEL NODE BIOPSY Right 02/21/2021   Procedure: RIGHT SENTINEL NODE BIOPSY;  Surgeon: Jovita Kussmaul, MD;  Location: [place];  Service: General;  Laterality: Right;  3  MINUTES ROOM 5    Family History  Problem Relation Age of Onset   Diabetes Mother    Heart attack Paternal Grandfather    Hypertension Father    Breast cancer Paternal Grandmother        dx 25s   Diabetes Maternal Grandmother    Diabetes Maternal Grandfather     Social History:  reports that she has never smoked. She has never used smokeless tobacco. She reports current alcohol use of about 1.0 standard drink of alcohol per week. She reports current drug use. Drug: Marijuana.  Allergies:  Allergies  Allergen Reactions   Other Hives    Chlorhexidine skin prep   Penicillins Rash    Tolerates ancef   Sulfamethoxazole-Trimethoprim Rash    Medications Prior to Admission  Medication Sig Dispense Refill Last Dose   ALPRAZolam (XANAX) 0.5 MG tablet Take 1 mg by mouth 2 (two) times daily as needed.   10/16/2022 at 0530   Cholecalciferol (VITAMIN D3) 50 MCG (2000 UT) TABS Take 1 capsule by mouth daily.   10/15/2022   fluticasone (FLONASE) 50 MCG/ACT nasal spray Place into both nostrils as needed for allergies or rhinitis.   10/15/2022   ketoconazole (NIZORAL) 2 % shampoo SMARTSIG:5 Milliliter(s) Topical 2-3 Times Daily   Past Week   MAGNESIUM PO Take by mouth every evening.   10/15/2022   Multiple Vitamin (MULTIVITAMIN PO) Take by mouth.   10/15/2022   Probiotic Product (PROBIOTIC PO) Take by mouth.   10/15/2022  fluocinolone (SYNALAR) 0.01 % external solution SMARTSIG:Topical 2-3 Times Weekly   Unknown   tamoxifen (NOLVADEX) 20 MG tablet TAKE 1 TABLET(20 MG) BY MOUTH DAILY (Patient taking differently: Not taking 7 days before surgery on 10/16/22 and 7 days after per oncologist.) 90 tablet 3 10/08/2022   triamcinolone ointment (KENALOG) 0.5 % Apply topically.   More than a month    REVIEW OF SYSTEMS: A ROS was performed and pertinent positives and negatives are included in the history. GENERAL: No fevers or chills. HEENT: No change in vision, no earache, sore throat or sinus congestion. NECK:  No pain or stiffness. CARDIOVASCULAR: No chest pain or pressure. No palpitations. PULMONARY: No shortness of breath, cough or wheeze. GASTROINTESTINAL: No abdominal pain, nausea, vomiting or diarrhea, melena or bright red blood per rectum. GENITOURINARY: No urinary frequency, urgency, hesitancy or dysuria. MUSCULOSKELETAL: No joint or muscle pain, no back pain, no recent trauma. DERMATOLOGIC: No rash, no itching, no lesions. ENDOCRINE: No polyuria, polydipsia, no heat or cold intolerance. No recent change in weight. HEMATOLOGICAL: No anemia or easy bruising or bleeding. NEUROLOGIC: No headache, seizures, numbness, tingling or weakness. PSYCHIATRIC: No depression, no loss of interest in normal activity or change in sleep pattern.    Blood pressure 117/77, pulse 70, temperature 98.1 F (36.7 C), temperature source Oral, resp. rate 16, height 5\' 6"  (1.676 m), weight 72.8 kg, last menstrual period 10/08/2022, SpO2 97 %.  Physical Exam:       Ann Buckley           02/22/1981        [id number]          61 y.o.  G0 Husband Vasectomized.   RP: Counseling and management of Right pelvic pain with large pedunculated fibroid    HPI: Right pelvic pain with large pedunculated fibroid.  Menses regular with normal flow.  No BTB. 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              OB History  Gravida Para Term Preterm AB Living  0 0 0 0 0 0  SAB IAB Ectopic Multiple Live Births   0 0 0 0         Past medical history,surgical history, problem list, medications, allergies, family history and social history were all reviewed and documented in the EPIC chart.     Directed ROS with pertinent positives and negatives documented in the history of present illness/assessment and plan.   Exam:      Vitals:    08/03/22 1403  BP: 118/79  Pulse: 87  SpO2: 99%  Weight: 154 lb (69.9 kg)  Height: 5' 5.75" (1.67 m)    General appearance:  Normal    Pelvic US 08/02/2022: Vaginal ultrasound: Comparison made with previous outside scan in 2020.    Anteverted uterus, increase in size and number of fibroids seen on previous scan.   Largest fibroid pedunculated to right side is now 10 x 6 cm (was 6 x 5 x 4 cm on previous scan).  Symmetrical endometrium - 7.6 mm.  Slightly distorted by small adjacent fibroid.  No masses seen.     Both ovaries normal size with normal follicle pattern.  2.3 x 1.8 cm resolving corpus luteum on the left side - cycle day 31.     Trace amount of free fluid.   MRI Pelvis 10/28/2018: Reproductive:   Uterus: Measures 7.6 x 4.1 x 4.7 cm. Normal endometrium.  Intrauterine device. The ultrasound abnormality corresponds to a subserosal 3.8 x 4.8 by 6.7 cm fibroid, projecting into the right hemipelvis including image 16/4 and image 9/3. This avidly enhances after contrast.   There is also a left-sided subserosal fibroid off the fundus at 2.2 cm on image 42/8.   The uterine architecture is atypical within the fundus and body, including on image 18/3. Ill definition of junctional zone.   Right ovary: Identified on image 18/4. Multiple small follicles within. 3.6 x 3.7 x 3.7 cm (volume = 26 cm^3)   Left ovary: Identified on image 19/4. Multiple small follicles identified within. 3.0 x 2.2 by 2.8 cm (volume = 9.7 cm^3).   Other: No significant free fluid.     Results for orders placed or performed during the hospital encounter of 10/16/22 (from the past 24 hour(s))  Pregnancy, urine POC     Status: None   Collection Time: 10/16/22  6:50 AM  Result Value Ref Range   Preg Test, Ur NEGATIVE NEGATIVE   1. Pelvic pain Right pelvic pain with large pedunculated fibroid.  Menses regular with normal flow.  No BTB. 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 Pelvic US 08/02/2022 showed an increase in size of the Rt pedunculated fibroid from 6.7 cm in diameter on the MRI 10/2018 to 10 cm  on the last Pelvic US. Other fibroids are also present.  Normal bilateral ovaries.  Endometrial line at 7.6 mm symmetrical with no mass. EBx done given that patient is on Tamoxifen. EBx Benign. No desire to preserve fertility, husband is vasectomized.  Will proceed with XI Robotic TLH/BSO. Oncologist confirmed that a Bilateral Oophorectomy is recommended.  Surgery and risks thoroughly reviewed with patient.  Preop preparation and post op complications and precautions reviewed.  Patient has allergy to Penicillin/Sulfa and Surgical tape.  Will give Lovenox prophylaxis.    2. Pedunculated leiomyoma of uterus Rt pelvic pain with increase in size of the largest/pedunculated Fibroid x 2020.  Probably the cause of patient's pelvic pain on the Right.     3. 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  Will stop Tamoxifen 7 days before surgery and restart 7 days after.   4. Monoallelic mutation of MITF gene                          Patient was counseled as to the risk of surgery to include the following:   1. Infection (prohylactic antibiotics will be administered)   2. DVT/Pulmonary Embolism (prophylactic pneumo compression stockings will be used)   3.Trauma to internal organs requiring additional surgical procedure to repair any injury to internal organs requiring perhaps additional hospitalization days.   4.Hemmorhage requiring transfusion and blood products which carry risks such as anaphylactic reaction, hepatitis and AIDS   Patient had received literature information on the procedure scheduled and all her questions were answered and fully accepts all risk.    Marie-Lyne Scheryl Sanborn 10/16/2022, 8:07 AM

## 2022-10-16 NOTE — Discharge Instructions (Signed)
  Post Anesthesia Home Care Instructions  Activity: Get plenty of rest for the remainder of the day. A responsible individual must stay with you for 24 hours following the procedure.  For the next 24 hours, DO NOT: -Drive a car -Operate machinery -Drink alcoholic beverages -Take any medication unless instructed by your physician -Make any legal decisions or sign important papers.  Meals: Start with liquid foods such as gelatin or soup. Progress to regular foods as tolerated. Avoid greasy, spicy, heavy foods. If nausea and/or vomiting occur, drink only clear liquids until the nausea and/or vomiting subsides. Call your physician if vomiting continues.  Special Instructions/Symptoms: Your throat may feel dry or sore from the anesthesia or the breathing tube placed in your throat during surgery. If this causes discomfort, gargle with warm salt water. The discomfort should disappear within 24 hours.  If you had a scopolamine patch placed behind your ear for the management of post- operative nausea and/or vomiting:  1. The medication in the patch is effective for 72 hours, after which it should be removed.  Wrap patch in a tissue and discard in the trash. Wash hands thoroughly with soap and water. 2. You may remove the patch earlier than 72 hours if you experience unpleasant side effects which may include dry mouth, dizziness or visual disturbances. 3. Avoid touching the patch. Wash your hands with soap and water after contact with the patch.     Post Anesthesia Home Care Instructions  Activity: Get plenty of rest for the remainder of the day. A responsible individual must stay with you for 24 hours following the procedure.  For the next 24 hours, DO NOT: -Drive a car -Operate machinery -Drink alcoholic beverages -Take any medication unless instructed by your physician -Make any legal decisions or sign important papers.  Meals: Start with liquid foods such as gelatin or soup. Progress  to regular foods as tolerated. Avoid greasy, spicy, heavy foods. If nausea and/or vomiting occur, drink only clear liquids until the nausea and/or vomiting subsides. Call your physician if vomiting continues.  Special Instructions/Symptoms: Your throat may feel dry or sore from the anesthesia or the breathing tube placed in your throat during surgery. If this causes discomfort, gargle with warm salt water. The discomfort should disappear within 24 hours.  If you had a scopolamine patch placed behind your ear for the management of post- operative nausea and/or vomiting:  1. The medication in the patch is effective for 72 hours, after which it should be removed.  Wrap patch in a tissue and discard in the trash. Wash hands thoroughly with soap and water. 2. You may remove the patch earlier than 72 hours if you experience unpleasant side effects which may include dry mouth, dizziness or visual disturbances. 3. Avoid touching the patch. Wash your hands with soap and water after contact with the patch.     

## 2022-10-16 NOTE — Progress Notes (Signed)
Patient up to bathroom unable to void at this time.

## 2022-10-16 NOTE — Op Note (Signed)
Operative Note  10/16/2022  11:28 AM  PATIENT:  Ann Buckley  42 y.o. female  PRE-OPERATIVE DIAGNOSIS:  Fibroids, pelvic pain, H/o breast cancer  POST-OPERATIVE DIAGNOSIS:  Fibroids, pelvic pain, H/o breast cancer  PROCEDURE:  Procedure(s): XI ROBOTIC ASSISTED TOTAL HYSTERECTOMY WITH BILATERAL SALPINGO-OOPHORECTOMY  SURGEON:  Surgeon(s): Princess Bruins, MD  ANESTHESIA:   none  FINDINGS: Uterus with large fibroids, normal tubes and ovaries.  Minimal pelvic endometriosis.  DESCRIPTION OF OPERATION: Under general anesthesia with endotracheal intubation, the patient is in lithotomy position.  She is prepped with DuraPrep on the abdomen and Betadine on the suprapubic, vulvar and vaginal areas.  She is draped as usual.  Timeout is done.  The Foley is inserted in the bladder.  The vaginal exam reveals a large fibroid on the right side with a nodular uterus, mobile.  No adnexal mass felt otherwise.  The weighted speculum is inserted into the vagina and the anterior lip of the cervix is grasped with a tenaculum.  The hysterometry is at 8 cm.  We therefore used a medium Koh ring with the 8 Rumi.  Those are put in place without difficulty.  The tenaculum is removed.  The weighted speculum is removed.  We go to the abdomen.      The supraumbilical area is infiltrated with Marcaine one quarter plain.  A 1.5 cm incision is done with a scalpel.  The aponeurosis is grasped with 2 Kocher's.  The aponeurosis is opened with Mayo scissors under direct vision.  The parietal peritoneum is opened bluntly with the finger.  A pursestring stitch of Vicryl 0 is done on the aponeurosis.  The Sheryle Hail is inserted at that level under direct vision and up pneumoperitoneum is created with CO2.  The camera is inserted.  The abdomen where ports will be introduced is free.  The uterus presents many small to average sized fibroids and a large pedunculated fibroid from the right fundus.  Both ovaries are normal in  size and appearance.  Both tubes are normal in size and appearance.  A small superficial lesion of endometriosis is present on the visceral peritoneum anteriorly.  The skin is marked with a surgical pen where the ports will be inserted.  Infiltration with Marcaine one quarter plain at each site.  Insertion of 3 robotic ports under direct vision and 1 assistant 5 mm port under direct vision.  2 robotic ports are on the right in line with the umbilicus.  1 robotic port is distal on the left in line with the umbilicus and the assistant port is medial on the left just above the umbilicus.  The patient is positioned in deep Trendelenburg and tolerates it well.  The robot is docked.  Robotic instruments are inserted under direct vision with the fenestrated clamp in the fourth arm, the scissors in the third arm, the camera in the second arm and the bipolar in the first arm.  We go to the console.      Both ureters are seen in normal anatomy position with good peristalsis.  The appendix and liver are normal in appearance.  Pictures are taken of the uterus with fibroids, the bilateral tubes and ovaries and the appendix.  We started on the right side with cauterization and section of the right infundibulopelvic ligament.  We then cauterized and sectioned just below the right ovary.  We cauterized and sectioned the right round ligament.  We opened the visceral peritoneum anteriorly and started lowering the bladder.  We proceeded exactly  the same way on the left side and further lowered the bladder past the [facility] ring.  We then cauterized the backflow on both the right and left uterine artery.  We then cauterized the right uterine artery.  We cauterized and sectioned the left uterine artery.  We come back to the right side and cauterized again and cut the right uterine artery.  The vaginal occluder is inflated.  We opened the vagina with the tip of the scissor on the Koh ring starting anteriorly and going to each side, finishing  posteriorly.  The uterus with cervix, bilateral tubes and ovaries are completely detached.  The specimen is too large to pass directly vaginally.  We therefore removed the large pedunculated fibroid first.  The uterus with smaller fibroids cervix bilateral tubes and bilateral ovaries are passed vaginally.  We then sectioned the larger fibroid in 3 pieces and passed vaginally.  All the specimens are sent together to pathology.  Hemostasis is verified and completed at the vaginal vault.  The instruments are changed to the cutting needle driver in the third arm and the long tip in the first arm.  We used a V-Loc 0 at 9 inches to close the vagina starting at the right angle in a running suture to the left angle and then back.  The uterosacral ligaments are included for suspension.  Hemostasis is adequate at all levels.  The needle is removed from the abdominopelvic cavity.  We irrigated and suctioned.  Hemostasis is confirmed. The ureters show good peristalsis.  Urine is clear. Pictures are taken.  Robotic instruments and laparoscopic instruments are removed.  The robot is undocked.  We go to laparoscopy time.      We confirmed good hemostasis.  We suctioned any fluid left after removing the deep Trendelenburg.  Laparoscopic instruments are removed.  The ports are removed under direct vision.  The CO2 is evacuated.  The pursestring stitch is attached at the supraumbilical incision.  A separate stitch of Vicryl 4 0 is done on the adipose tissue of the supraumbilical incision.  A subcuticular suture of Vicryl 4-0 is done on each incision.  Dermabond is added.  Hemostasis is adequate.  The occluder was removed from the vagina.  The patient was brought to recovery room in good and stable status.  ESTIMATED BLOOD LOSS: 100 mL   Intake/Output Summary (Last 24 hours) at 10/16/2022 1128 Last data filed at 10/16/2022 1120 Gross per 24 hour  Intake 900 ml  Output 300 ml  Net 600 ml     BLOOD ADMINISTERED:none    LOCAL MEDICATIONS USED:  MARCAINE     SPECIMEN:  Source of Specimen:  Uterus with Fibroids, cervix, bilateral tubes and ovaries.  DISPOSITION OF SPECIMEN:  PATHOLOGY  COUNTS:  YES  PLAN OF CARE: Transfer to PACU  Marie-Lyne LavoieMD11:28 AM

## 2022-10-16 NOTE — Anesthesia Preprocedure Evaluation (Signed)
Anesthesia Evaluation  Patient identified by MRN, date of birth, ID band Patient awake    Reviewed: Allergy & Precautions, H&P , NPO status , Patient's Chart, lab work & pertinent test results  Airway Mallampati: II  TM Distance: >3 FB Neck ROM: Full    Dental no notable dental hx.    Pulmonary neg pulmonary ROS   Pulmonary exam normal breath sounds clear to auscultation       Cardiovascular negative cardio ROS Normal cardiovascular exam Rhythm:Regular Rate:Normal     Neuro/Psych   Anxiety     negative neurological ROS  negative psych ROS   GI/Hepatic negative GI ROS, Neg liver ROS,,,  Endo/Other  negative endocrine ROS    Renal/GU negative Renal ROS  negative genitourinary   Musculoskeletal negative musculoskeletal ROS (+)    Abdominal   Peds negative pediatric ROS (+)  Hematology negative hematology ROS (+)   Anesthesia Other Findings Cancer  Reproductive/Obstetrics negative OB ROS                             Anesthesia Physical Anesthesia Plan  ASA: 3  Anesthesia Plan: General   Post-op Pain Management: Dilaudid IV   Induction: Intravenous  PONV Risk Score and Plan: 3 and Ondansetron, Dexamethasone, Midazolam and Treatment may vary due to age or medical condition  Airway Management Planned: Oral ETT  Additional Equipment:   Intra-op Plan:   Post-operative Plan: Extubation in OR  Informed Consent: I have reviewed the patients History and Physical, chart, labs and discussed the procedure including the risks, benefits and alternatives for the proposed anesthesia with the patient or authorized representative who has indicated his/her understanding and acceptance.     Dental advisory given  Plan Discussed with: CRNA  Anesthesia Plan Comments:        Anesthesia Quick Evaluation

## 2022-10-16 NOTE — Anesthesia Procedure Notes (Signed)
Procedure Name: Intubation Date/Time: 10/16/2022 8:27 AM  Performed by: Aimee Timmons D, CRNAPre-anesthesia Checklist: Patient identified, Emergency Drugs available, Suction available and Patient being monitored Patient Re-evaluated:Patient Re-evaluated prior to induction Oxygen Delivery Method: Circle system utilized Preoxygenation: Pre-oxygenation with 100% oxygen Induction Type: IV induction Ventilation: Mask ventilation without difficulty Laryngoscope Size: Mac and 3 Grade View: Grade I Tube type: Oral Number of attempts: 1 Airway Equipment and Method: Stylet and Oral airway Placement Confirmation: ETT inserted through vocal cords under direct vision, positive ETCO2 and breath sounds checked- equal and bilateral Secured at: 21 cm Tube secured with: Tape Dental Injury: Teeth and Oropharynx as per pre-operative assessment

## 2022-10-16 NOTE — Anesthesia Postprocedure Evaluation (Signed)
Anesthesia Post Note  Patient: Ann Buckley  Procedure(s) Performed: XI ROBOTIC ASSISTED TOTAL HYSTERECTOMY WITH BILATERAL SALPINGO OOPHORECTOMY (Bilateral: Abdomen)     Patient location during evaluation: PACU Anesthesia Type: General Level of consciousness: awake and alert Pain management: pain level controlled Vital Signs Assessment: post-procedure vital signs reviewed and stable Respiratory status: spontaneous breathing, nonlabored ventilation and respiratory function stable Cardiovascular status: blood pressure returned to baseline and stable Postop Assessment: no apparent nausea or vomiting Anesthetic complications: no   No notable events documented.  Last Vitals:  Vitals:   10/16/22 1145 10/16/22 1200  BP:  107/74  Pulse:    Resp:  16  Temp:  36.5 C  SpO2: 95%     Last Pain:  Vitals:   10/16/22 1200  TempSrc:   PainSc: Asleep                 Lynda Rainwater

## 2022-10-16 NOTE — Transfer of Care (Signed)
Immediate Anesthesia Transfer of Care Note  Patient: Ann Buckley  Procedure(s) Performed: XI ROBOTIC ASSISTED TOTAL HYSTERECTOMY WITH BILATERAL SALPINGO OOPHORECTOMY (Bilateral: Abdomen)  Patient Location: PACU  Anesthesia Type:General  Level of Consciousness: awake, alert , and oriented  Airway & Oxygen Therapy: Patient Spontanous Breathing and Patient connected to face mask oxygen  Post-op Assessment: Report given to RN and Post -op Vital signs reviewed and stable  Post vital signs: Reviewed and stable  Last Vitals:  Vitals Value Taken Time  BP    Temp    Pulse    Resp    SpO2      Last Pain:  Vitals:   10/16/22 0714  TempSrc: Oral  PainSc: 0-No pain      Patients Stated Pain Goal: 6 ([id number])  Complications: No notable events documented.

## 2022-10-17 ENCOUNTER — Encounter (HOSPITAL_BASED_OUTPATIENT_CLINIC_OR_DEPARTMENT_OTHER): Payer: Self-pay | Admitting: Obstetrics & Gynecology

## 2022-10-18 LAB — SURGICAL PATHOLOGY

## 2022-10-19 ENCOUNTER — Encounter: Payer: Self-pay | Admitting: Obstetrics & Gynecology

## 2022-10-22 ENCOUNTER — Encounter: Payer: Self-pay | Admitting: Obstetrics & Gynecology

## 2022-10-24 ENCOUNTER — Encounter: Payer: Self-pay | Admitting: Obstetrics & Gynecology

## 2022-10-24 DIAGNOSIS — R21 Rash and other nonspecific skin eruption: Secondary | ICD-10-CM

## 2022-10-25 ENCOUNTER — Other Ambulatory Visit: Payer: Self-pay | Admitting: Obstetrics & Gynecology

## 2022-10-25 ENCOUNTER — Other Ambulatory Visit: Payer: Self-pay

## 2022-10-25 ENCOUNTER — Telehealth: Payer: 59

## 2022-10-25 MED ORDER — PREDNISONE 10 MG (21) PO TBPK: ORAL_TABLET | ORAL | 0 refills | Status: DC

## 2022-10-25 NOTE — Telephone Encounter (Signed)
Rx pend. Please review and sign. Thanks.

## 2022-10-30 ENCOUNTER — Ambulatory Visit (INDEPENDENT_AMBULATORY_CARE_PROVIDER_SITE_OTHER): Payer: 59 | Admitting: Obstetrics & Gynecology

## 2022-10-30 ENCOUNTER — Encounter: Payer: Self-pay | Admitting: Obstetrics & Gynecology

## 2022-10-30 VITALS — BP 110/76 | HR 68

## 2022-10-30 DIAGNOSIS — Z170 Estrogen receptor positive status [ER+]: Secondary | ICD-10-CM

## 2022-10-30 DIAGNOSIS — E8941 Symptomatic postprocedural ovarian failure: Secondary | ICD-10-CM

## 2022-10-30 DIAGNOSIS — Z09 Encounter for follow-up examination after completed treatment for conditions other than malignant neoplasm: Secondary | ICD-10-CM | POA: Diagnosis not present

## 2022-10-30 DIAGNOSIS — Z1509 Genetic susceptibility to other malignant neoplasm: Secondary | ICD-10-CM

## 2022-10-30 DIAGNOSIS — C50411 Malignant neoplasm of upper-outer quadrant of right female breast: Secondary | ICD-10-CM

## 2022-10-30 DIAGNOSIS — Z1589 Genetic susceptibility to other disease: Secondary | ICD-10-CM

## 2022-10-30 NOTE — Progress Notes (Signed)
    Ann Buckley Sep 14, 1980 [id number]        42 y.o.  G0   RP: Postop XL robotic assisted total hysterectomy with bilateral salpingo oophorectomy on 10/16/22  HPI: XL robotic assisted total hysterectomy with bilateral salpingo oophorectomy on 10/16/22.  Patho benign.  Healing well.  Allergy to tape with skin rash resolved on Prednisone 10 day pack.  Incisions well closed.  Left lower abdominal wall discomfort intermittently.  Only taking Ibuprofen for pain control.  No vaginal bleeding.  Took a 15 min walk recently.  Urine/BMs normal.  No fever.   OB History  Gravida Para Term Preterm AB Living  0 0 0 0 0 0  SAB IAB Ectopic Multiple Live Births  0 0 0 0      Past medical history,surgical history, problem list, medications, allergies, family history and social history were all reviewed and documented in the EPIC chart.   Directed ROS with pertinent positives and negatives documented in the history of present illness/assessment and plan.  Exam:  Vitals:   10/30/22 1328  BP: 110/76  Pulse: 68  SpO2: 98%   General appearance:  Normal  Abdomen: Incisions well closed, no erythema, no induration, no discharge.  Abdomen soft, NT, not distended.  Gynecologic exam: Vulva normal.  Speculum:  Vaginal vault well closed.  No blood.  Normal secretions.   Assessment/Plan:  42 y.o. G0  1. Status post gynecological surgery, follow-up exam XL robotic assisted total hysterectomy with bilateral salpingo oophorectomy on 10/16/22.  Patho benign.  Healing well.  Allergy to tape with skin rash resolved on Prednisone 10 day pack.  Incisions well closed.  Left lower abdominal wall discomfort intermittently.  Only taking Ibuprofen for pain control.  No vaginal bleeding.  Took a 15 min walk recently.  Urine/BMs normal.  No fever. Postop exam normal.  Will f/u in 4-6 weeks.  Precautions reviewed.  No IC until 8 weeks postop.  2. Symptomatic postsurgical menopause Will discuss alternatives  to HRT if very symptomatic from menopausal menopause at next exam. Will consider Veozah or Anti-Depressants.  3. Malignant neoplasm of upper-outer quadrant of right breast in female, estrogen receptor positive  4. Monoallelic mutation of MITF gene  Other orders - Simethicone (GAS-X PO); Take by mouth. - Docusate Calcium (STOOL SOFTENER PO); Take by mouth. - IBUPROFEN PO; Take by mouth.   Genia Del MD, 1:41 PM 10/30/2022

## 2022-11-13 ENCOUNTER — Other Ambulatory Visit: Payer: Self-pay | Admitting: Hematology and Oncology

## 2022-11-13 DIAGNOSIS — Z853 Personal history of malignant neoplasm of breast: Secondary | ICD-10-CM

## 2022-11-15 ENCOUNTER — Ambulatory Visit
Admission: RE | Admit: 2022-11-15 | Discharge: 2022-11-15 | Disposition: A | Discharge status: Home / Self Care | Payer: 59 | Source: Ambulatory Visit | Attending: Hematology and Oncology | Admitting: Hematology and Oncology

## 2022-11-15 DIAGNOSIS — Z853 Personal history of malignant neoplasm of breast: Secondary | ICD-10-CM

## 2022-11-21 ENCOUNTER — Encounter: Payer: Self-pay | Admitting: Hematology and Oncology

## 2022-11-22 ENCOUNTER — Encounter: Payer: Self-pay | Admitting: *Deleted

## 2022-11-22 ENCOUNTER — Telehealth: Payer: Self-pay | Admitting: Hematology and Oncology

## 2022-11-22 NOTE — Progress Notes (Signed)
Received mychart message from pt with complaint of ongoing hot flashes post oophorectomy and hysterectomy and still on Tamoxifen.  Pt notified and office visit is needed to discuss possible discontinuation of Tamoxifen considering pt has undergone oophorectomy. Message sent to scheduling.

## 2022-11-22 NOTE — Telephone Encounter (Signed)
Scheduled appointment per scheduling message. Patient is aware of the made appointment. 

## 2022-11-27 ENCOUNTER — Encounter: Payer: Self-pay | Admitting: Obstetrics & Gynecology

## 2022-11-27 ENCOUNTER — Ambulatory Visit (INDEPENDENT_AMBULATORY_CARE_PROVIDER_SITE_OTHER): Payer: 59 | Admitting: Obstetrics & Gynecology

## 2022-11-27 VITALS — BP 118/80 | HR 82

## 2022-11-27 DIAGNOSIS — Z1589 Genetic susceptibility to other disease: Secondary | ICD-10-CM

## 2022-11-27 DIAGNOSIS — Z09 Encounter for follow-up examination after completed treatment for conditions other than malignant neoplasm: Secondary | ICD-10-CM

## 2022-11-27 DIAGNOSIS — Z170 Estrogen receptor positive status [ER+]: Secondary | ICD-10-CM

## 2022-11-27 DIAGNOSIS — C50411 Malignant neoplasm of upper-outer quadrant of right female breast: Secondary | ICD-10-CM

## 2022-11-27 DIAGNOSIS — Z1509 Genetic susceptibility to other malignant neoplasm: Secondary | ICD-10-CM

## 2022-11-27 NOTE — Progress Notes (Signed)
    Ann Buckley 1981-06-08 [id number]        42 y.o.  G0  RP: Postop  XL robotic assisted total hysterectomy with bilateral salpingo oophorectomy on 10/16/22  HPI: Very good healing.  No abdominopelvic pain. No vaginal bleeding or d/c.  No fever.  Urine/BMs normal.  Resumed work yesterday.  Progressively started back exercising.   OB History  Gravida Para Term Preterm AB Living  0 0 0 0 0 0  SAB IAB Ectopic Multiple Live Births  0 0 0 0      Past medical history,surgical history, problem list, medications, allergies, family history and social history were all reviewed and documented in the EPIC chart.   Directed ROS with pertinent positives and negatives documented in the history of present illness/assessment and plan.  Exam:  Vitals:   11/27/22 1536  BP: 118/80  Pulse: 82  SpO2: 99%   General appearance:  Normal  Abdomen: Incisions closed.  No sign of infection.  Gynecologic exam: Vulva normal.  Bimanual exam:  Vaginal vault well closed, NT.  No pelvic mass.   Assessment/Plan:  42 y.o. G0P0000   1. Status post gynecological surgery, follow-up exam Very good healing post Robotic TLH/BSO.  Patho benign.  No abdominopelvic pain. No vaginal bleeding or d/c.  No fever.  Urine/BMs normal.  Resumed work yesterday.  Progressively started back exercising. Incisions well healed.  Very good healing of the vaginal vault.  Will resume sexual activities at 8 weeks.  2. Malignant neoplasm of upper-outer quadrant of right breast in female, estrogen receptor positive (HCC)  3. Monoallelic mutation of MITF gene  Other orders - hydrOXYzine (ATARAX) 25 MG tablet; Take 25-50 mg by mouth 3 (three) times daily as needed.   Genia Del MD, 3:39 PM 11/27/2022

## 2022-12-05 NOTE — Progress Notes (Signed)
Patient Care Team: Georgianne Fick, MD as PCP - General (Internal Medicine) Griselda Miner, MD as Consulting Physician (General Surgery) Serena Croissant, MD as Consulting Physician (Hematology and Oncology) Dorothy Puffer, MD as Consulting Physician (Radiation Oncology) Olivia Mackie, NP as Nurse Practitioner (Gynecology)  DIAGNOSIS: No diagnosis found.  SUMMARY OF ONCOLOGIC HISTORY: Oncology History  Malignant neoplasm of upper outer quadrant of female breast (HCC)  12/09/2020 Initial Diagnosis   Screening mammogram showed right breast calcifications. Diagnostic mammogram and US showed 3 groups of calcifications in the right breast, largest 1.4cm, with two smaller measuring 0.1cm. 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 Testing was completed through the BRCAplus and CancerNext-Expanded + RNAinsight panels offered by [facility] laboratories. The report date is 12/28/2020.   The BRCAplus panel offered by [facility] and includes sequencing and deletion/duplication analysis for the following 8 genes: ATM, BRCA1, BRCA2, CDH1, CHEK2, PALB2, PTEN, and TP53. The CancerNext-Expanded + RNAinsight gene panel offered by [facility] and includes sequencing and rearrangement analysis for the following 77 genes: AIP, ALK, APC, ATM, AXIN2, BAP1, BARD1, BLM, BMPR1A, BRCA1, BRCA2, BRIP1, CDC73, CDH1, CDK4, CDKN1B, CDKN2A, CHEK2, CTNNA1, DICER1, FANCC, FH, FLCN, GALNT12, KIF1B, LZTR1, MAX, MEN1, MET, MLH1, MSH2, MSH3, MSH6, MUTYH, NBN, NF1, NF2,  NTHL1, PALB2, PHOX2B, PMS2, POT1, PRKAR1A, PTCH1, PTEN, RAD51C, RAD51D, RB1, RECQL, RET, SDHA, SDHAF2, SDHB, SDHC, SDHD, SMAD4, SMARCA4, SMARCB1, SMARCE1, STK11, SUFU, TMEM127, TP53, TSC1, TSC2, VHL and XRCC2 (sequencing and deletion/duplication); EGFR, EGLN1, HOXB13, KIT, MITF, PDGFRA, POLD1 and POLE (sequencing only); EPCAM and GREM1 (deletion/duplication only). RNA data is routinely analyzed for use in variant interpretation for all genes.   01/18/2021 Surgery   Right lumpectomy: Incidental focus of IDC grade 1, 0.2 cm, foci of DCIS intermediate grade with focal necrosis, margins negative, ER 60%, PR 80% T1 a N0 stage Ia   01/18/2021 Cancer Staging   Staging form: Breast, AJCC 8th Edition - Pathologic stage from 01/18/2021: Stage IA (pT1a, pN0, cM0, G1, ER+, PR+, HER2-) - Signed by Loa Socks, NP on 07/28/2021 Stage prefix: Initial diagnosis Histologic grading system: 3 grade system   04/03/2021 - 05/17/2021 Radiation Therapy   Site Technique Total Dose (Gy) Dose per Fx (Gy) Completed Fx Beam Energies  Breast, Right: Breast_Rt 3D 50.4/50.4 1.8 28/28 6X  Breast, Right: Breast_Rt_Bst 3D 10/10 2 5/5 6X     06/11/2021 -  Anti-estrogen oral therapy   Tamoxifen, started at half dose, increased to full dose 06/25/21     CHIEF COMPLIANT:  Follow-up breast cancer on Tamoxifen   INTERVAL HISTORY: Ann Buckley is a 42 y.o. with above-mentioned history of right breast cancer having undergone right lumpectomy, currently on radiation therapy. She presents to the clinic today for follow-up.    ALLERGIES:  is allergic to other, penicillins, and sulfamethoxazole-trimethoprim.  MEDICATIONS:  Current Outpatient Medications  Medication Sig Dispense Refill   ALPRAZolam (XANAX) 0.5 MG tablet Take 1 mg by mouth 2 (two) times daily as needed.     Cholecalciferol (VITAMIN D3) 50 MCG (2000 UT) TABS Take 1 capsule  by mouth daily.     fluocinolone (SYNALAR) 0.01 % external solution  SMARTSIG:Topical 2-3 Times Weekly     fluticasone (FLONASE) 50 MCG/ACT nasal spray Place into both nostrils as needed for allergies or rhinitis.     hydrOXYzine (ATARAX) 25 MG tablet Take 25-50 mg by mouth 3 (three) times daily as needed.     ketoconazole (NIZORAL) 2 % shampoo SMARTSIG:5 Milliliter(s) Topical 2-3 Times Daily     MAGNESIUM PO Take by mouth every evening.     Multiple Vitamin (MULTIVITAMIN PO) Take by mouth.     Probiotic Product (PROBIOTIC PO) Take by mouth.     tamoxifen (NOLVADEX) 20 MG tablet TAKE 1 TABLET(20 MG) BY MOUTH DAILY (Patient taking differently: Not taking 7 days before surgery on 10/16/22 and 7 days after per oncologist.) 90 tablet 3   triamcinolone ointment (KENALOG) 0.5 % Apply topically.     No current facility-administered medications for this visit.    PHYSICAL EXAMINATION: ECOG PERFORMANCE STATUS: {CHL ONC ECOG [phone]}  There were no vitals filed for this visit. There were no vitals filed for this visit.  BREAST:*** No palpable masses or nodules in either right or left breasts. No palpable axillary supraclavicular or infraclavicular adenopathy no breast tenderness or nipple discharge. (exam performed in the presence of a chaperone)  LABORATORY DATA:  I have reviewed the data as listed    Latest Ref Rng & Units 10/10/2022    8:56 AM 01/11/2021    9:19 AM 12/14/2020   12:18 PM  CMP  Glucose 70 - 99 mg/dL 90  84  960   BUN 6 - 20 mg/dL 15  9  10    Creatinine 0.44 - 1.00 mg/dL 4.54  0.98  1.19   Sodium 135 - 145 mmol/L 138  136  140   Potassium 3.5 - 5.1 mmol/L 3.9  4.1  3.9   Chloride 98 - 111 mmol/L 108  105  106   CO2 22 - 32 mmol/L 23  24  26    Calcium 8.9 - 10.3 mg/dL 9.1  9.6  9.5   Total Protein 6.5 - 8.1 g/dL  7.6  7.4   Total Bilirubin 0.3 - 1.2 mg/dL  1.1  0.6   Alkaline Phos 38 - 126 U/L  32  36   AST 15 - 41 U/L  19  16   ALT 0 - 44 U/L  15  10     Lab Results  Component Value Date   WBC 13.1 (H) 10/16/2022   HGB 13.1  10/16/2022   HCT 40.7 10/16/2022   MCV 100.5 (H) 10/16/2022   PLT 315 10/16/2022   NEUTROABS 4.5 12/14/2020    ASSESSMENT & PLAN:  No problem-specific Assessment & Plan notes found for this encounter.    No orders of the defined types were placed in this encounter.  The patient has a good understanding of the overall plan. she agrees with it. she will call with any problems that may develop before the next visit here. Total time spent: 30 mins including face to face time and time spent for planning, charting and co-ordination of care   Sherlyn Lick, CMA 12/05/22    I Janan Ridge am acting as a Neurosurgeon for [employer]  ***

## 2022-12-06 ENCOUNTER — Inpatient Hospital Stay: Payer: 59 | Attending: Hematology and Oncology | Admitting: Hematology and Oncology

## 2022-12-06 VITALS — BP 129/86 | HR 86 | Temp 98.6°F | Wt 156.1 lb

## 2022-12-06 DIAGNOSIS — Z923 Personal history of irradiation: Secondary | ICD-10-CM | POA: Diagnosis not present

## 2022-12-06 DIAGNOSIS — C50411 Malignant neoplasm of upper-outer quadrant of right female breast: Secondary | ICD-10-CM

## 2022-12-06 DIAGNOSIS — Z170 Estrogen receptor positive status [ER+]: Secondary | ICD-10-CM

## 2022-12-06 DIAGNOSIS — Z79810 Long term (current) use of selective estrogen receptor modulators (SERMs): Secondary | ICD-10-CM | POA: Diagnosis not present

## 2022-12-06 NOTE — Assessment & Plan Note (Addendum)
12/09/2020:Screening mammogram showed right breast calcifications. Diagnostic mammogram and US showed 3 groups of calcifications in the right breast, largest 1.4cm, with two smaller measuring 0.1cm. 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   Treatment plan: 1. Breast conserving surgery Right lumpectomy: Incidental focus of IDC grade 1, 0.2 cm, foci of DCIS intermediate grade with focal necrosis, margins negative, ER 60%, PR 80%, Ki-67 2%, HER2 negative T1 a N0 stage Ia 2. sentinel lymph node study 02/21/2021: 0/5 lymph nodes negative 3. Followed by adjuvant radiation therapy 04/04/2021-05/17/2021 4. Followed by antiestrogen therapy with tamoxifen 5-10 years started November 2022 ------------------------------------------------------------------------------------------------------------------------------------------ Tamoxifen toxicities: 1.  Insomnia: Encouraged her to take Tamoxifen at bedtime. 2. hot flashes: Intermittently they come and go. I discussed with the patient since she had a hysterectomy and bilateral salpingo-oophorectomy she is eligible to take anastrozole therapy.  We discussed the pros and cons of anastrozole and decided to maintain her on tamoxifen and consider switching to anastrozole.  She is very concerned about the loss of bone density and other side effects related to anastrozole.   10/16/2022: Hysterectomy with bilateral salpingo-oophorectomy   Breast cancer surveillance: 1.  Breast exam 03/23/2022: Benign 2. mammogram 11/10/2021: Benign breast density category C 3.  Breast MRI 04/27/2022: Benign breast density category C   Return to clinic in September for her annual follow-up.

## 2023-01-28 ENCOUNTER — Ambulatory Visit: Payer: 59 | Attending: General Surgery

## 2023-01-28 VITALS — Wt 160.5 lb

## 2023-01-28 DIAGNOSIS — Z483 Aftercare following surgery for neoplasm: Secondary | ICD-10-CM | POA: Insufficient documentation

## 2023-01-28 NOTE — Therapy (Signed)
OUTPATIENT PHYSICAL THERAPY SOZO SCREENING NOTE   Patient Name: Ann Buckley MRN: [id number] DOB:1980/12/09, 42 y.o., female Today's Date: 01/28/2023  PCP: Georgianne Fick, MD REFERRING PROVIDER: Griselda Miner, MD   PT End of Session - 01/28/23 4098     Visit Number 2   # unchanged due to screen only   PT Start Time 0800    PT Stop Time 0805    PT Time Calculation (min) 5 min    Activity Tolerance Patient tolerated treatment well    Behavior During Therapy [facility] for tasks assessed/performed             Past Medical History:  Diagnosis Date   Abnormal Pap smear of cervix 2001   Anxiety    Follows w/ PCP, Dr. Nicholos Johns.   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 Magdalene Molly, oncologist.   Family history of breast cancer    PCOS (polycystic ovarian syndrome)    Personal history of radiation therapy    right breast, 04/04/21 - 05/17/21   STD (sexually transmitted disease)    hsv1   Uterine fibroid    Wears contact lenses    Wears glasses    Past Surgical History:  Procedure Laterality Date   BREAST LUMPECTOMY Right 01/18/2021   BREAST LUMPECTOMY WITH RADIOACTIVE SEED LOCALIZATION Right 01/18/2021   Procedure: RIGHT BREAST LUMPECTOMY WITH RADIOACTIVE SEED LOCALIZATION X 2;  Surgeon: Griselda Miner, MD;  Location: [facility] OR;  Service: General;  Laterality: Right;   CERVICAL BIOPSY  W/ LOOP ELECTRODE EXCISION  Age 58 2001   Paps have been normal eversince    COLPOSCOPY     ENDOMETRIAL BIOPSY  09/28/2022   INTRAUTERINE DEVICE (IUD) INSERTION  07/2017   Mirena    ROBOTIC ASSISTED TOTAL HYSTERECTOMY WITH BILATERAL SALPINGO OOPHERECTOMY Bilateral 10/16/2022   Procedure: XI ROBOTIC ASSISTED TOTAL HYSTERECTOMY WITH BILATERAL SALPINGO OOPHORECTOMY;  Surgeon: Genia Del, MD;  Location: [facility];  Service: Gynecology;  Laterality: Bilateral;   SENTINEL NODE BIOPSY Right 02/21/2021   Procedure: RIGHT  SENTINEL NODE BIOPSY;  Surgeon: Griselda Miner, MD;  Location: [facility];  Service: General;  Laterality: Right;  60 MINUTES ROOM 5   Patient Active Problem List   Diagnosis Date Noted   Postoperative state 10/16/2022   Eczema 09/28/2022   Melasma 09/28/2021   Seborrheic dermatitis of scalp 09/28/2021   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 Diagnostic mammogram and US showed 3 groups of calcifications in the right breast, largest 1.4cm, with two smaller measuring 0.1cm. 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  She had a right lumpectomy performed on 12/29/20 and is pending a SLNB on 02/21/2021.  She also had a positive genetic test.She will have adjuvant radiation and anti estrogens   PRECAUTIONS: right UE Lymphedema risk, None  SUBJECTIVE: Pt returns for her 3 month L-Dex screen.   PAIN:  Are you having pain? No  SOZO SCREENING: Patient was assessed today using the SOZO machine to determine the lymphedema index score. This was compared to her baseline score. It was determined that she is  within the recommended range when compared to her baseline and no further action is needed at this time. She will continue SOZO screenings. These are done every 3 months for 2 years post operatively followed by every 6 months for 2 years, and then annually.   L-DEX FLOWSHEETS - 01/28/23 0800       L-DEX LYMPHEDEMA SCREENING   Measurement Type Unilateral    L-DEX MEASUREMENT EXTREMITY Upper Extremity    POSITION  Standing    DOMINANT SIDE Right    At Risk Side Right    BASELINE SCORE (UNILATERAL) -1.7     L-DEX SCORE (UNILATERAL) -1.7    VALUE CHANGE (UNILAT) 0            P: Begin 6 month L-Dex screens next.   Hermenia Bers, PTA 01/28/2023, 8:07 AM

## 2023-03-21 ENCOUNTER — Telehealth: Payer: Self-pay | Admitting: Hematology and Oncology

## 2023-03-21 NOTE — Telephone Encounter (Signed)
 Rescheduled appointment per patients request via incoming call. Patient is aware of the changes made to her upcoming appointment.

## 2023-03-28 ENCOUNTER — Ambulatory Visit: Payer: 59 | Admitting: Hematology and Oncology

## 2023-04-16 ENCOUNTER — Encounter: Payer: Self-pay | Admitting: Hematology and Oncology

## 2023-04-16 ENCOUNTER — Inpatient Hospital Stay: Payer: 59 | Attending: Hematology and Oncology | Admitting: Hematology and Oncology

## 2023-04-16 VITALS — BP 130/77 | HR 78 | Temp 97.5°F | Resp 18 | Ht 66.0 in | Wt 157.8 lb

## 2023-04-16 DIAGNOSIS — Z90710 Acquired absence of both cervix and uterus: Secondary | ICD-10-CM | POA: Diagnosis not present

## 2023-04-16 DIAGNOSIS — C50411 Malignant neoplasm of upper-outer quadrant of right female breast: Secondary | ICD-10-CM | POA: Insufficient documentation

## 2023-04-16 DIAGNOSIS — M2560 Stiffness of unspecified joint, not elsewhere classified: Secondary | ICD-10-CM | POA: Diagnosis not present

## 2023-04-16 DIAGNOSIS — R6882 Decreased libido: Secondary | ICD-10-CM | POA: Diagnosis not present

## 2023-04-16 DIAGNOSIS — Z170 Estrogen receptor positive status [ER+]: Secondary | ICD-10-CM | POA: Diagnosis not present

## 2023-04-16 DIAGNOSIS — Z79810 Long term (current) use of selective estrogen receptor modulators (SERMs): Secondary | ICD-10-CM | POA: Diagnosis not present

## 2023-04-16 DIAGNOSIS — N951 Menopausal and female climacteric states: Secondary | ICD-10-CM | POA: Diagnosis not present

## 2023-04-16 MED ORDER — TAMOXIFEN CITRATE 20 MG PO TABS: 20.0000 mg | ORAL_TABLET | Freq: Every day | ORAL | 3 refills | Status: DC

## 2023-04-16 NOTE — Telephone Encounter (Signed)
Per md orders entered for [employer]ater[employer]. Requisition and all supported documents faxed to [phone]. Faxed confirmation was received.  

## 2023-04-16 NOTE — Assessment & Plan Note (Addendum)
12/09/2020:Screening mammogram showed right breast calcifications. Diagnostic mammogram and US showed 3 groups of calcifications in the right breast, largest 1.4cm, with two smaller measuring 0.1cm. 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   Treatment plan: 1. Breast conserving surgery Right lumpectomy: Incidental focus of IDC grade 1, 0.2 cm, foci of DCIS intermediate grade with focal necrosis, margins negative, ER 60%, PR 80%, Ki-67 2%, HER2 negative T1 a N0 stage Ia 2. sentinel lymph node study 02/21/2021: 0/5 lymph nodes negative 3. Followed by adjuvant radiation therapy 04/04/2021-05/17/2021 4. Followed by antiestrogen therapy with tamoxifen 5-10 years started November 2022 switched to anastrozole 12/06/2022 5. 10/16/2022: Hysterectomy with bilateral salpingo-oophorectomy  ------------------------------------------------------------------------------------------------------------------------------------------ Tamoxifen toxicities: Hot flashes: Keeping her up at night Joint stiffness Loss of libido: Referral to pelvic physical therapy   Breast cancer surveillance: 1. mammogram 11/15/2022: Benign breast density category C 2.  Breast MRI 04/27/2022: Benign breast density category C  For next year I recommend switching her to contrast-enhanced mammograms and we can skip the MRIs.  [employer] testing for MRD

## 2023-04-16 NOTE — Addendum Note (Signed)
Addended by: Sherlyn Lick on: 04/16/2023 02:49 PM   Modules accepted: Orders

## 2023-04-16 NOTE — Progress Notes (Signed)
Patient Care Team: Georgianne Fick, MD as PCP - General (Internal Medicine) Griselda Miner, MD as Consulting Physician (General Surgery) Serena Croissant, MD as Consulting Physician (Hematology and Oncology) Dorothy Puffer, MD as Consulting Physician (Radiation Oncology) Olivia Mackie, NP as Nurse Practitioner (Gynecology)  DIAGNOSIS:  Encounter Diagnosis  Name Primary?   Malignant neoplasm of upper-outer quadrant of right breast in female, estrogen receptor positive (HCC) Yes    SUMMARY OF ONCOLOGIC HISTORY: Oncology History  Malignant neoplasm of upper outer quadrant of female breast (HCC)  12/09/2020 Initial Diagnosis   Screening mammogram showed right breast calcifications. Diagnostic mammogram and US showed 3 groups of calcifications in the right breast, largest 1.4cm, with two smaller measuring 0.1cm. 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 Testing was completed through the BRCAplus and CancerNext-Expanded + RNAinsight panels offered by [facility] laboratories. The report date is 12/28/2020.   The BRCAplus panel offered by [facility] and includes sequencing and deletion/duplication analysis for the following 8 genes: ATM, BRCA1, BRCA2, CDH1, CHEK2, PALB2, PTEN, and TP53. The CancerNext-Expanded + RNAinsight gene panel offered by [facility] and includes sequencing and rearrangement analysis for the following 77 genes: AIP, ALK, APC, ATM, AXIN2, BAP1, BARD1, BLM, BMPR1A, BRCA1, BRCA2, BRIP1, CDC73, CDH1, CDK4, CDKN1B,  CDKN2A, CHEK2, CTNNA1, DICER1, FANCC, FH, FLCN, GALNT12, KIF1B, LZTR1, MAX, MEN1, MET, MLH1, MSH2, MSH3, MSH6, MUTYH, NBN, NF1, NF2, NTHL1, PALB2, PHOX2B, PMS2, POT1, PRKAR1A, PTCH1, PTEN, RAD51C, RAD51D, RB1, RECQL, RET, SDHA, SDHAF2, SDHB, SDHC, SDHD, SMAD4, SMARCA4, SMARCB1, SMARCE1, STK11, SUFU, TMEM127, TP53, TSC1, TSC2, VHL and XRCC2 (sequencing and deletion/duplication); EGFR, EGLN1, HOXB13, KIT, MITF, PDGFRA, POLD1 and POLE (sequencing only); EPCAM and GREM1 (deletion/duplication only). RNA data is routinely analyzed for use in variant interpretation for all genes.   01/18/2021 Surgery   Right lumpectomy: Incidental focus of IDC grade 1, 0.2 cm, foci of DCIS intermediate grade with focal necrosis, margins negative, ER 60%, PR 80% T1 a N0 stage Ia   01/18/2021 Cancer Staging   Staging form: Breast, AJCC 8th Edition - Pathologic stage from 01/18/2021: Stage IA (pT1a, pN0, cM0, G1, ER+, PR+, HER2-) - Signed by Loa Socks, NP on 07/28/2021 Stage prefix: Initial diagnosis Histologic grading system: 3 grade system   04/03/2021 - 05/17/2021 Radiation Therapy   Site Technique Total Dose (Gy) Dose per Fx (Gy) Completed Fx Beam Energies  Breast, Right: Breast_Rt 3D 50.4/50.4 1.8 28/28 6X  Breast, Right: Breast_Rt_Bst 3D 10/10 2 5/5 6X     06/11/2021 -  Anti-estrogen oral therapy   Tamoxifen, started at half dose, increased to full dose 06/25/21     CHIEF COMPLIANT: Follow-up on tamoxifen  Discussed the use of AI scribe software for clinical note transcription with the patient, who gave verbal consent to proceed.  History of Present Illness   Ann Buckley, a patient with a history of breast cancer currently on tamoxifen, presents with menopausal symptoms following a hysterectomy performed in March. She reports hot flashes, which were initially manageable but have recently become more disruptive, particularly affecting her sleep. She also  experiences joint stiffness, which she speculates  might be related to the weather. Minnetta notes a decrease in libido, which she attributes to the tamoxifen suppressing some receptors in the brain. She also mentions difficulty losing weight despite a low-calorie diet and high exercise regimen. Camari reports a small exostosis in her mouth, which was evaluated by her dentist and found to be non-pathological. She is interested in exploring options for managing her menopausal symptoms, including supplements and pelvic floor therapy.         ALLERGIES:  is allergic to other, penicillins, and sulfamethoxazole-trimethoprim.  MEDICATIONS:  Current Outpatient Medications  Medication Sig Dispense Refill   ALPRAZolam (XANAX) 0.5 MG tablet Take 1 mg by mouth 2 (two) times daily as needed.     Cholecalciferol (VITAMIN D3) 50 MCG (2000 UT) TABS Take 1 capsule by mouth daily.     fluocinolone (SYNALAR) 0.01 % external solution SMARTSIG:Topical 2-3 Times Weekly     fluticasone (FLONASE) 50 MCG/ACT nasal spray Place into both nostrils as needed for allergies or rhinitis.     hydrOXYzine (ATARAX) 25 MG tablet Take 25-50 mg by mouth 3 (three) times daily as needed.     ketoconazole (NIZORAL) 2 % shampoo SMARTSIG:5 Milliliter(s) Topical 2-3 Times Daily     MAGNESIUM PO Take by mouth every evening.     Multiple Vitamin (MULTIVITAMIN PO) Take by mouth.     Probiotic Product (PROBIOTIC PO) Take by mouth.     triamcinolone ointment (KENALOG) 0.5 % Apply topically.     tamoxifen (NOLVADEX) 20 MG tablet Take 1 tablet (20 mg total) by mouth daily. Once daily 90 tablet 3   No current facility-administered medications for this visit.    PHYSICAL EXAMINATION: ECOG PERFORMANCE STATUS: 1 - Symptomatic but completely ambulatory  Vitals:   04/16/23 1330  BP: 130/77  Pulse: 78  Resp: 18  Temp: (!) 97.5 F (36.4 C)  SpO2: 100%   Filed Weights   04/16/23 1330  Weight: 157 lb 12.8 oz (71.6 kg)      LABORATORY DATA:  I have reviewed the data as listed     Latest Ref Rng & Units 10/10/2022    8:56 AM 01/11/2021    9:19 AM 12/14/2020   12:18 PM  CMP  Glucose 70 - 99 mg/dL 90  84  562   BUN 6 - 20 mg/dL 15  9  10    Creatinine 0.44 - 1.00 mg/dL 1.30  8.65  7.84   Sodium 135 - 145 mmol/L 138  136  140   Potassium 3.5 - 5.1 mmol/L 3.9  4.1  3.9   Chloride 98 - 111 mmol/L 108  105  106   CO2 22 - 32 mmol/L 23  24  26    Calcium 8.9 - 10.3 mg/dL 9.1  9.6  9.5   Total Protein 6.5 - 8.1 g/dL  7.6  7.4   Total Bilirubin 0.3 - 1.2 mg/dL  1.1  0.6   Alkaline Phos 38 - 126 U/L  32  36   AST 15 - 41 U/L  19  16   ALT 0 - 44 U/L  15  10     Lab Results  Component Value Date   WBC 13.1 (H) 10/16/2022   HGB 13.1 10/16/2022   HCT 40.7 10/16/2022   MCV 100.5 (H) 10/16/2022   PLT 315 10/16/2022   NEUTROABS 4.5 12/14/2020    ASSESSMENT & PLAN:  Malignant neoplasm of upper outer quadrant of female breast (HCC)  12/09/2020:Screening mammogram showed right breast calcifications. Diagnostic mammogram and US showed 3 groups of calcifications in the right breast, largest 1.4cm, with two smaller measuring 0.1cm. 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   Treatment plan: 1. Breast conserving surgery Right lumpectomy: Incidental focus of IDC grade 1, 0.2 cm, foci of DCIS intermediate grade with focal necrosis, margins negative, ER 60%, PR 80%, Ki-67 2%, HER2 negative T1 a N0 stage Ia 2. sentinel lymph node study 02/21/2021: 0/5 lymph nodes negative 3. Followed by adjuvant radiation therapy 04/04/2021-05/17/2021 4. Followed by antiestrogen therapy with tamoxifen 5-10 years started November 2022 switched to anastrozole 12/06/2022 5. 10/16/2022: Hysterectomy with bilateral salpingo-oophorectomy  ------------------------------------------------------------------------------------------------------------------------------------------ Tamoxifen toxicities: Hot flashes: Keeping her up at night Joint stiffness Loss of libido: Referral to pelvic physical  therapy   Breast cancer surveillance: 1. mammogram 11/15/2022: Benign breast density category C 2.  Breast MRI 04/27/2022: Benign breast density category C  For next year I recommend switching her to contrast-enhanced mammograms and we can skip the MRIs.  Signatera testing for MRD   -Plan for Breast Cancer Index test at 5-year mark.  Menopausal Symptoms Patient is experiencing hot flashes, joint stiffness, and decreased libido following oophorectomy. She is interested in non-prescription options for managing symptoms. -Consider referral to pelvic floor therapy for management of menopausal symptoms. -Consider Effexor for severe hot flashes if symptoms become unmanageable. -Consider lifestyle modifications such as light clothing, fan, and lighter comforters for managing hot flashes.    Follow-up -Plan for accelerated MRI in October 2024. -Plan for contrast-enhanced mammogram in April 2025.          Orders Placed This Encounter  Procedures   MM 2D DIAG BILAT WITH CONTRAST BCG ONLY    Standing Status:   Future    Standing Expiration Date:   04/15/2024    Order Specific Question:   Reason for Exam (SYMPTOM  OR DIAGNOSIS REQUIRED)    Answer:   ANnual mammograms High risk    Order Specific Question:   If indicated for the ordered procedure, I authorize the administration of contrast media per Radiology protocol    Answer:   Yes    Order Specific Question:   Does the patient have a contrast media/X-ray dye allergy?    Answer:   No    Order Specific Question:   Is the patient pregnant?    Answer:   No    Order Specific Question:   Preferred Imaging Location?    Answer:   [facility]    Order Specific Question:   Release to patient    Answer:   Immediate   MR BREAST W & WO CM SCREENING (GI)    Standing Status:   Future    Standing Expiration Date:   04/15/2024    Order Specific Question:   If indicated for the ordered procedure, I authorize the administration of contrast media  per Radiology protocol    Answer:   Yes    Order Specific Question:   Is the patient pregnant?    Answer:   No    Order Specific Question:   Is the patient's LMP greater than 28 days?    Answer:   Yes    Order Specific Question:   What is the patient's sedation requirement?    Answer:   No Sedation    Order Specific Question:   Does the patient have a pacemaker or implanted devices?    Answer:   No  Order Specific Question:   Preferred imaging location?    Answer:   [facility] (table limit-550lbs)    Order Specific Question:   Release to patient    Answer:   Immediate   Ambulatory referral to Physical Therapy    Referral Priority:   Routine    Referral Type:   Physical Medicine    Referral Reason:   Specialty Services Required    Requested Specialty:   Physical Therapy    Number of Visits Requested:   1   The patient has a good understanding of the overall plan. she agrees with it. she will call with any problems that may develop before the next visit here. Total time spent: 30 mins including face to face time and time spent for planning, charting and co-ordination of care   Tamsen Meek, MD 04/16/23

## 2023-05-06 ENCOUNTER — Ambulatory Visit
Admission: RE | Admit: 2023-05-06 | Discharge: 2023-05-06 | Disposition: A | Discharge status: Home / Self Care | Payer: No Typology Code available for payment source | Source: Ambulatory Visit | Attending: Hematology and Oncology

## 2023-05-06 ENCOUNTER — Telehealth: Payer: Self-pay

## 2023-05-06 DIAGNOSIS — C50411 Malignant neoplasm of upper-outer quadrant of right female breast: Secondary | ICD-10-CM

## 2023-05-06 MED ORDER — GADOPICLENOL 0.5 MMOL/ML IV SOLN
7.5000 mL | Freq: Once | INTRAVENOUS | Status: AC | PRN
  Administered 2023-05-06: 7.5 mL via INTRAVENOUS

## 2023-05-06 NOTE — Telephone Encounter (Signed)
-----   Message from Noreene Filbert sent at 05/06/2023  1:23 PM EDT ----- Call patient with good news MRI is negative. ----- Message ----- From: Interface, Rad Results In Sent: 05/06/2023  12:46 PM EDT To: Serena Croissant, MD

## 2023-05-06 NOTE — Telephone Encounter (Signed)
Per Np called pt. With good news MRI was negative. Pt verbalized and understanding.

## 2023-05-07 ENCOUNTER — Ambulatory Visit: Payer: 59

## 2023-05-12 LAB — SIGNATERA ONLY (NATERA MANAGED)
SIGNATERA MTM READOUT: 0 MTM/ml
SIGNATERA TEST RESULT: NEGATIVE

## 2023-05-13 ENCOUNTER — Encounter (HOSPITAL_COMMUNITY): Payer: Self-pay

## 2023-05-29 ENCOUNTER — Encounter: Payer: Self-pay | Admitting: Hematology and Oncology

## 2023-06-12 ENCOUNTER — Ambulatory Visit (INDEPENDENT_AMBULATORY_CARE_PROVIDER_SITE_OTHER): Payer: 59 | Admitting: Otolaryngology

## 2023-06-12 ENCOUNTER — Encounter (INDEPENDENT_AMBULATORY_CARE_PROVIDER_SITE_OTHER): Payer: Self-pay

## 2023-06-12 ENCOUNTER — Ambulatory Visit (INDEPENDENT_AMBULATORY_CARE_PROVIDER_SITE_OTHER): Payer: 59 | Admitting: Audiology

## 2023-06-12 VITALS — Ht 66.0 in | Wt 150.0 lb

## 2023-06-12 DIAGNOSIS — H6123 Impacted cerumen, bilateral: Secondary | ICD-10-CM

## 2023-06-12 DIAGNOSIS — H9042 Sensorineural hearing loss, unilateral, left ear, 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: Secondary | ICD-10-CM | POA: Diagnosis not present

## 2023-06-12 NOTE — Progress Notes (Signed)
  [address] [place], [place] [address] [phone]  Audiological Evaluation    Name: Ann Buckley     DOB:   1981/01/21      MRN:   [id number]                                                                                     Service Date: 06/12/2023        Patient was referred today for a hearing evaluation by Dr. Karle Barr.   Symptoms Yes Details  Hearing loss  [x]  Patient reported perceiving hearing loss where the left ear is worse than the right ear.  Tinnitus  []  Patient denied experiencing tinnitus.  Balance problems  []  Patient denied vertigo sensations.  Previous ear surgeries  []  Patient denied any previous ear surgeries.  Family history  []  Patient denied family history of hearing loss.  Amplification  []  Patient denied the use of hearing aids.    Otoscopy: Right ear: Non-occluding cerumen; notable landmarks visualized on the tympanic membrane. Left ear: Non-occluding cerumen; notable landmarks visualized on the tympanic membrane.    Tympanogram: Right ear: Normal external ear canal volume with normal middle ear pressure and tympanic membrane compliance (Type A). Left ear: Normal external ear canal volume with normal middle ear pressure and tympanic membrane compliance (Type A).    Hearing Evaluation: The audiogram was completed using conventional audiometric techniques under headphones with good reliability.   The hearing test results indicate: Right ear: Normal hearing sensitivity from [phone]. Left ear: Normal hearing sensitivity from [phone] Hz sloping to moderately-severe sensorineural hearing loss from 1500-8000 Hz.  Speech Audiometry: Right ear- Speech Reception Threshold (SRT) was obtained at 5 dBHL. Left ear- Speech Reception Threshold (SRT) was obtained at 20 dBHL masked.   Word Recognition Score Tested using NU-6 (MLV) Right ear: 100% was obtained at a presentation level of 45 dBHL which is deemed as excellent  understanding. Left ear: 72% was obtained at a presentation level of 60 dBHL with contralateral masking which is deemed as fair understanding.    Recommendations: Repeat audiogram when changes are perceived or per MD.   Ann Buckley, AUD, CCC-A 06/12/23

## 2023-06-14 DIAGNOSIS — H9042 Sensorineural hearing loss, unilateral, left ear, 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: Secondary | ICD-10-CM | POA: Insufficient documentation

## 2023-06-14 DIAGNOSIS — H6123 Impacted cerumen, bilateral: Secondary | ICD-10-CM | POA: Insufficient documentation

## 2023-06-14 NOTE — Progress Notes (Signed)
Patient ID: Ann Buckley, female   DOB: 08/17/1980, 42 y.o.   MRN: [id number]  Follow-up: Left ear hearing loss  HPI: The patient is a 42 year old female who returns today for her follow-up evaluation.  The patient was last seen 6 months ago.  At that time, she was noted to have an asymmetric left ear sensorineural hearing loss, secondary to a barotrauma.  The hearing amplification options were discussed.  The patient returns today complaining of occasional clogging sensation in her ears.  She is still having hearing difficulty on the left side.  She denies any significant otalgia or otorrhea.  Exam: General: Communicates without difficulty, well nourished, no acute distress. Head: Normocephalic, no evidence injury, no tenderness, facial buttresses intact without stepoff. Face/sinus: No tenderness to palpation and percussion. Facial movement is normal and symmetric. Eyes: PERRL, EOMI. No scleral icterus, conjunctivae clear. Neuro: CN II exam reveals vision grossly intact.  No nystagmus at any point of gaze. EAC: Bilateral cerumen accumulation.  Under the operating microscope, the cerumen is carefully removed with a combination of cerumen currette, alligator forceps, and suction catheters.  After the cerumen is removed, the TMs are noted to be normal.  Nose: External evaluation reveals normal support and skin without lesions.  Dorsum is intact.  Anterior rhinoscopy reveals pink mucosa over anterior aspect of inferior turbinates and intact septum.  No purulence noted. Oral:  Oral cavity and oropharynx are intact, symmetric, without erythema or edema.  Mucosa is moist without lesions. Neck: Full range of motion without pain.  There is no significant lymphadenopathy.  No masses palpable.  Thyroid bed within normal limits to palpation.  Parotid glands and submandibular glands equal bilaterally without mass.  Trachea is midline. Neuro:  CN 2-12 grossly intact. Gait normal.   AUDIOMETRIC TESTING: I  have read and reviewed the audiometric test, which shows asymmetric left ear high-frequency sensorineural hearing loss.   Assessment: 1.  Bilateral cerumen accumulation.  After the cerumen removal procedure, both tympanic membranes and middle ear spaces are noted to be normal. 2.  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  According to the patient, the hearing loss was a result of barotrauma to the left ear.  Plan: 1.  Otomicroscopy with bilateral cerumen removal. 2.  The physical exam findings and the hearing test results are reviewed with the patient. 3.  The patient is a candidate for hearing amplification.   4.  The patient will return for reevaluation in 1 year.

## 2023-06-27 NOTE — Therapy (Signed)
OUTPATIENT PHYSICAL THERAPY FEMALE PELVIC EVALUATION   Patient Name: Ann Buckley MRN: [id number] DOB:[birth year], 42 y.o., female Today's Date: 06/28/2023  END OF SESSION:  PT End of Session - 06/28/23 0925     Visit Number 1    Authorization Type [employer] PPO 2024  No Auth Required    PT Start Time 0845    PT Stop Time 0930    PT Time Calculation (min) 45 min    Activity Tolerance Patient tolerated treatment well    Behavior During Therapy WFL for tasks assessed/performed             Past Medical History:  Diagnosis Date   Abnormal Pap smear of cervix 2001   Anxiety    Follows w/ PCP, Dr. Nicholos Johns.   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 Magdalene Molly, oncologist.   Family history of breast cancer    PCOS (polycystic ovarian syndrome)    Personal history of radiation therapy    right breast, 04/04/21 - 05/17/21   STD (sexually transmitted disease)    hsv1   Uterine fibroid    Wears contact lenses    Wears glasses    Past Surgical History:  Procedure Laterality Date   BREAST LUMPECTOMY Right 01/18/2021   BREAST LUMPECTOMY WITH RADIOACTIVE SEED LOCALIZATION Right 01/18/2021   Procedure: RIGHT BREAST LUMPECTOMY WITH RADIOACTIVE SEED LOCALIZATION X 2;  Surgeon: Griselda Miner, MD;  Location: [facility] OR;  Service: General;  Laterality: Right;   CERVICAL BIOPSY  W/ LOOP ELECTRODE EXCISION  Age 68 2001   Paps have been normal eversince    COLPOSCOPY     ENDOMETRIAL BIOPSY  09/28/2022   INTRAUTERINE DEVICE (IUD) INSERTION  07/2017   Mirena    ROBOTIC ASSISTED TOTAL HYSTERECTOMY WITH BILATERAL SALPINGO OOPHERECTOMY Bilateral 10/16/2022   Procedure: XI ROBOTIC ASSISTED TOTAL HYSTERECTOMY WITH BILATERAL SALPINGO OOPHORECTOMY;  Surgeon: Genia Del, MD;  Location: [facility];  Service: Gynecology;  Laterality: Bilateral;   SENTINEL NODE BIOPSY Right 02/21/2021   Procedure: RIGHT SENTINEL NODE BIOPSY;   Surgeon: Griselda Miner, MD;  Location: [facility];  Service: General;  Laterality: Right;  60 MINUTES ROOM 5   Patient Active Problem List   Diagnosis Date Noted   Sensorineural hearing loss, unilateral, left ear, 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 06/14/2023   Impacted cerumen of both ears 06/14/2023   Postoperative state 10/16/2022   Eczema 09/28/2022   Melasma 09/28/2021   Seborrheic dermatitis of scalp 09/28/2021   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   Genetic testing 12/20/2020   Family history of breast cancer    Malignant neoplasm of upper outer quadrant of female breast (HCC) 12/09/2020    PCP: Georgianne Fick, MD PCP - General  REFERRING PROVIDER: Serena Croissant, MD Ref Provider  REFERRING DIAG: C50.411,Z17.0 (ICD-10-CM) - Malignant neoplasm of upper-outer quadrant of right breast in female, estrogen receptor positive (HCC)   THERAPY DIAG:  Other muscle spasm  Muscle weakness (generalized)  Rationale for Evaluation and Treatment: Rehabilitation  ONSET DATE: 09/2022  SUBJECTIVE:  SUBJECTIVE STATEMENT: Pt  reports that she had a hysterectomy 09/2022, d/t fibroids after tamoxifen for her breast cancer.  She reports that she is in menopause. No incontinence, no pain with sex. She would like to learn about what to watch out for after hysterectomy and prevent any issues. She does pilates, yoga and burn boot camp.  Goes to a lot of screenings, Dr appts. Breast cancer 2022.  Fluid intake: Yes: water    PAIN:  Are you having pain? Yes scar right breast- between breast ans right shoulder- when she hugs someone NPRS scale: 5/10 Pain location:  no pelvic pain  Pain type: aching and tight Pain description: intermittent   Aggravating factors:  hugging Relieving factors: rest  PRECAUTIONS: None  RED FLAGS: None   WEIGHT BEARING RESTRICTIONS: No  FALLS:  Has patient fallen in last 6 months? No  LIVING ENVIRONMENT: Lives with: lives with their family Lives in: House/apartment Stairs: No Has following equipment at home: None  OCCUPATION: part time in [employer]  PLOF: Independent  PATIENT GOALS: to learn how to prevent bladder prolapse  PERTINENT HISTORY:  Hysterectomy 09/2022 Lumpectomy 2022 Sexual abuse: No  BOWEL MOVEMENT: no issues  URINATION: no issues  INTERCOURSE: Pain with intercourse:  no, no dryness Ability to have vaginal penetration:  Yes:   Climax: yes Marinoff Scale: 0/3  PREGNANCY: Vaginal deliveries 0 Tearing No C-section deliveries 0 Currently pregnant No  PROLAPSE: None   OBJECTIVE:  Note: Objective measures were completed at Evaluation unless otherwise noted.  COGNITION: Overall cognitive status: Within functional limits for tasks assessed     SENSATION: Light touch: Appears intact Proprioception: Appears intact  MUSCLE LENGTH: Hamstrings: Right 90 deg; Left 90 deg  LUMBAR SPECIAL TESTS:  Single leg stance test: Negative    POSTURE: No Significant postural limitations  PELVIC ALIGNMENT: even  LUMBARAROM/PROM: within functional limitations   LOWER EXTREMITY ROM: within functional limitations, grossly at least 4/5 overall  LOWER EXTREMITY MMT: within functional limitations  PALPATION:   General  Abdominal scars not tender                  Some tenderness and restrictionsnoticed right breast scar                External Perineal Exam no tenderness                             Internal Pelvic Floor no tenderness, good coordination and strength, able to contract, relax, bulge  Patient confirms identification and approves PT to assess internal pelvic floor and treatment Yes  PELVIC MMT:   MMT eval  Vaginal 4/5  Internal Anal Sphincter   External Anal Sphincter    Puborectalis   Diastasis Recti no  (Blank rows = not tested)        TONE: good  PROLAPSE: No   TODAY'S TREATMENT:  DATE: 06/28/23   EVAL se below   PATIENT EDUCATION:  Education details/ therapeutic activities: relevant anatomy, pressure management strategies to reduce risk of bladder prolapse, transverse abdominis breath vs breath holding education on scar cupping  Person educated: Patient Education method: Explanation, Demonstration, Tactile cues, and Verbal cues Education comprehension: verbalized understanding and returned demonstration  HOME EXERCISE PROGRAM: N/A  ASSESSMENT:  CLINICAL IMPRESSION: Patient is a 42 y.o. F who was seen today for physical therapy evaluation and treatment for pelvic floor after hysterectomy. She demonstrates good pelvic floor strength, tone, coordination and awareness. Has some tenderness right breast scar that responded well to cupping, pt educated on cupping at home. Abdominal scars not tender. Discussed optimal pressure management with exercise to prevent bladder prolapse and to return to PT PRN. Pt not interested in PT at this time, will let us know if any symptoms arise in the future.    OBJECTIVE IMPAIRMENTS: increased fascial restrictions.   ACTIVITY LIMITATIONS:  hugging  PARTICIPATION LIMITATIONS:  none  PERSONAL FACTORS: Fitness are also affecting patient's functional outcome.   REHAB POTENTIAL: Excellent  CLINICAL DECISION MAKING: Stable/uncomplicated  EVALUATION COMPLEXITY: Low   GOALS: N/A at this time  PLAN:eval only today, pt will return to PT if needed in the future  PT FREQUENCY:  none  PT DURATION: other: eval only  PLANNED INTERVENTIONS: none  PLAN FOR NEXT SESSION: none   Lynnetta Tom, PT 06/28/23 11:23 AM

## 2023-06-28 ENCOUNTER — Encounter: Payer: Self-pay | Admitting: Physical Therapy

## 2023-06-28 ENCOUNTER — Other Ambulatory Visit: Payer: Self-pay

## 2023-06-28 ENCOUNTER — Ambulatory Visit: Payer: 59 | Attending: Hematology and Oncology | Admitting: Physical Therapy

## 2023-06-28 DIAGNOSIS — M62838 Other muscle spasm: Secondary | ICD-10-CM | POA: Diagnosis present

## 2023-06-28 DIAGNOSIS — C50411 Malignant neoplasm of upper-outer quadrant of right female breast: Secondary | ICD-10-CM | POA: Diagnosis not present

## 2023-06-28 DIAGNOSIS — M6281 Muscle weakness (generalized): Secondary | ICD-10-CM | POA: Diagnosis present

## 2023-06-28 DIAGNOSIS — Z170 Estrogen receptor positive status [ER+]: Secondary | ICD-10-CM | POA: Diagnosis not present

## 2023-07-02 ENCOUNTER — Encounter: Payer: 59 | Admitting: Physical Therapy

## 2023-07-09 ENCOUNTER — Encounter: Payer: 59 | Admitting: Physical Therapy

## 2023-07-10 ENCOUNTER — Other Ambulatory Visit: Payer: Self-pay | Admitting: Medical Genetics

## 2023-07-15 ENCOUNTER — Ambulatory Visit: Payer: 59 | Admitting: Obstetrics and Gynecology

## 2023-07-20 ENCOUNTER — Encounter: Payer: Self-pay | Admitting: Hematology and Oncology

## 2023-07-23 ENCOUNTER — Other Ambulatory Visit: Payer: Self-pay | Admitting: *Deleted

## 2023-07-23 MED ORDER — TAMOXIFEN CITRATE 20 MG PO TABS: 10.0000 mg | ORAL_TABLET | Freq: Two times a day (BID) | ORAL | 3 refills | Status: DC

## 2023-07-25 ENCOUNTER — Other Ambulatory Visit: Payer: Self-pay | Admitting: *Deleted

## 2023-07-25 MED ORDER — TAMOXIFEN CITRATE 10 MG PO TABS: 10.0000 mg | ORAL_TABLET | Freq: Two times a day (BID) | ORAL | 3 refills | Status: DC

## 2023-07-26 ENCOUNTER — Ambulatory Visit: Payer: 59 | Admitting: Obstetrics and Gynecology

## 2023-08-01 ENCOUNTER — Ambulatory Visit (INDEPENDENT_AMBULATORY_CARE_PROVIDER_SITE_OTHER): Payer: 59 | Admitting: Obstetrics and Gynecology

## 2023-08-01 ENCOUNTER — Encounter: Payer: Self-pay | Admitting: Obstetrics and Gynecology

## 2023-08-01 ENCOUNTER — Other Ambulatory Visit: Payer: Self-pay | Admitting: Urology

## 2023-08-01 VITALS — BP 110/68 | HR 72 | Ht 65.75 in | Wt 157.0 lb

## 2023-08-01 DIAGNOSIS — Z1331 Encounter for screening for depression: Secondary | ICD-10-CM | POA: Diagnosis not present

## 2023-08-01 DIAGNOSIS — E041 Nontoxic single thyroid nodule: Secondary | ICD-10-CM

## 2023-08-01 DIAGNOSIS — Z1211 Encounter for screening for malignant neoplasm of colon: Secondary | ICD-10-CM

## 2023-08-01 DIAGNOSIS — Z01419 Encounter for gynecological examination (general) (routine) without abnormal findings: Secondary | ICD-10-CM

## 2023-08-01 DIAGNOSIS — Z1289 Encounter for screening for malignant neoplasm of other sites: Secondary | ICD-10-CM

## 2023-08-01 NOTE — Progress Notes (Signed)
 43 y.o. y.o. female here for annual exam. Patient's last menstrual period was 09/26/2022.   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 UTERUS, CERVIX, BILATERAL FALLOPIAN TUBES AND OVARIES, FIBROIDS, RESECTION: Cervix   - Benign squamous mucosa; negative for dysplasia   - Endocervical mucosa with slight inflammation and nabothian cysts Endometrium   - Benign inactive endometrium; negative for hyperplasia Myometrium   - Benign leiomyomata Adnexa   - Benign, right fimbriated fallopian tube with paratubal cyst   - Benign, left fimbriated fallopian tube   - Bilateral ovaries with hemorrhagic follicular cysts - Negative for malignancy   GROSS DESCRIPTION:  Specimen: Received fresh labeled cervix, uterus, bilateral fallopian tubes and ovaries, fibroid Specimen integrity: A focally disrupted uterus and cervix with attached bilateral adnexa, and additional partially morcellated portions of tan-pink fibrous tissue. Size and shape: A symmetrical uterus measuring 9.8 cm fundus to cervix by 6.1 cm f to cornu by 4.5 cm anterior to posterior, and additional aggregate of fibrous tissue measures 16.0 x 14.0 x 6.0 cm Weight: The uterus and cervix have a weight of 142 g, and the additional tissue has a weight of 353 g. Serosa: Tan-pink, hyperemic, with several subserosal bulging nodules ranging from 1.0 to 3.8 cm in greatest dimension.  2 disrupted, cauterized areas are noted on the  posterior and the fundus of the uterus. Cervix: Ectocervix is tan-pink, hyperemic, with moderate flaking of the outer surface.  The cervix measures 3.5 x 3.0 cm with a 0.6 cm cervical os.  The endocervical canal measures 2.5 cm in length, and is tan-pink and corrugated.  Cervical stroma is tan-pink and fibrous. Endometrium: Triangular endometrial cavity measures 4.2 x 3.7 cm. Endometrium is tan-red, hyperemic, with a 1.3 cm bulging submucosal nodule identified.  The endometrium measures 0.1 cm in thickness. Myometrium: Tan-pink and trabeculated, with multiple tan-white, whorled, bulging intramural nodules identified ranging from 0.4 to 1.7 cm in greatest dimension.  Sectioning the separate fibrous tissue reveals tan-white to pink.  Whorled fibrous cut surfaces.  1 portion of tissue displays focally hemorrhagic cut surfaces. Right adnexa: The right fimbriated fallopian tube measures 7.5 cm in length and up to 0.6 cm in diameter.  The serosal surface is red-purple, hyperemic, with mild adhesions.  Sectioning reveals a tan-red cut surface with a pinpoint lumen.  The adjacent ovary measures 6.5 x 4.3 x 2.5 cm.  External surface is tan-pink to purple, with a 4.7 cm collapsed cystic structure.  The ovary displays tan-pink fibrous cut surface, with multiple serous fluid-filled cysts.  The collapsed cystic structure displays a red-brown hemorrhagic lining. Left adnexa: The left fimbriated fallopian tube measures 7.0 cm in length and up to 0.6 cm in diameter.  The serosal surface is red-purple and dusky.  Sectioning reveals a tan-red cut surface with a pinpoint lumen.  The adjacent left ovary measures 4.7 x 3.0 x 1.9 cm.  External surface is tan-pink and partially flattened.  Sectioning the ovary reveals tan-pink fibrous cut surface, with multiple smooth-walled cysts filled with serous and hemorrhagic fluid.  The largest cyst measures 1.1 cm in   Body mass index is 25.53 kg/m.     08/01/2023     9:50 AM 08/08/2021    9:44 AM  Depression screen PHQ 2/9  Decreased Interest 0 0  Down, Depressed, Hopeless 0 0  PHQ - 2 Score 0 0    Blood pressure 110/68, pulse 72, height 5' 5.75 (1.67 m), weight 157 lb (71.2 kg), last menstrual period 09/26/2022.     Component Value Date/Time   DIAGPAP  06/28/2021 1542    - Negative for intraepithelial lesion or malignancy (NILM)   DIAGPAP  06/10/2018 0000    NEGATIVE FOR INTRAEPITHELIAL LESIONS OR MALIGNANCY.   ADEQPAP  06/28/2021 1542    Satisfactory for evaluation; transformation zone component ABSENT.   ADEQPAP  06/10/2018 0000    Satisfactory for evaluation  endocervical/transformation zone component PRESENT.    GYN HISTORY:    Component Value Date/Time   DIAGPAP  06/28/2021 1542    - Negative for intraepithelial lesion or malignancy (NILM)   DIAGPAP  06/10/2018 0000    NEGATIVE FOR INTRAEPITHELIAL LESIONS OR MALIGNANCY.   ADEQPAP  06/28/2021 1542    Satisfactory for evaluation; transformation zone component ABSENT.   ADEQPAP  06/10/2018 0000    Satisfactory for evaluation  endocervical/transformation zone component PRESENT.    OB History  Gravida Para Term Preterm AB Living  0 0 0 0 0 0  SAB IAB Ectopic Multiple Live Births  0 0 0 0     Past Medical History:  Diagnosis Date   Abnormal Pap smear of cervix 2001   Anxiety    Follows w/ PCP, Dr. Verdia.   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   Family history of breast cancer    PCOS (polycystic ovarian syndrome)    Personal history of radiation therapy    right breast, 04/04/21 - 05/17/21   STD (sexually transmitted disease)    hsv1   Uterine fibroid    Wears contact lenses    Wears glasses     Past Surgical History:  Procedure Laterality Date   BREAST LUMPECTOMY Right 01/18/2021   BREAST LUMPECTOMY WITH RADIOACTIVE SEED LOCALIZATION Right 01/18/2021   Procedure: RIGHT BREAST LUMPECTOMY WITH  RADIOACTIVE SEED LOCALIZATION X 2;  Surgeon: Curvin Deward MOULD, MD;  Location: [facility];  Service: General;  Laterality: Right;   CERVICAL BIOPSY  W/ LOOP ELECTRODE EXCISION  Age 40 2001   Paps have been normal eversince    COLPOSCOPY     ENDOMETRIAL BIOPSY  09/28/2022   INTRAUTERINE DEVICE (IUD) INSERTION  07/2017   Mirena     ROBOTIC ASSISTED TOTAL HYSTERECTOMY WITH BILATERAL SALPINGO OOPHERECTOMY Bilateral 10/16/2022   Procedure: XI ROBOTIC ASSISTED TOTAL HYSTERECTOMY WITH BILATERAL SALPINGO OOPHORECTOMY;  Surgeon: Lavoie, Marie-Lyne, MD;  Location: [facility] [place];  Service: Gynecology;  Laterality: Bilateral;   SENTINEL NODE BIOPSY Right 02/21/2021   Procedure: RIGHT SENTINEL NODE BIOPSY;  Surgeon: Curvin Deward MOULD, MD;  Location: [facility];  Service: General;  Laterality: Right;  60 MINUTES ROOM 5    Current Outpatient Medications on File Prior to Visit  Medication Sig Dispense Refill   ALPRAZolam (XANAX) 0.5 MG tablet Take 1 mg by mouth 2 (two) times daily as needed.     Cholecalciferol (VITAMIN D3) 50 MCG (2000 UT) TABS Take 1 capsule by mouth daily.     fluocinolone (SYNALAR) 0.01 % external solution SMARTSIG:Topical 2-3 Times  Weekly     fluticasone (FLONASE) 50 MCG/ACT nasal spray Place into both nostrils as needed for allergies or rhinitis.     hydrOXYzine (ATARAX) 25 MG tablet Take 25 mg by mouth 3 (three) times daily as needed.     ketoconazole (NIZORAL) 2 % shampoo SMARTSIG:5 Milliliter(s) Topical 2-3 Times Daily     MAGNESIUM PO Take by mouth every evening.     Multiple Vitamin (MULTIVITAMIN PO) Take by mouth.     Probiotic Product (PROBIOTIC PO) Take by mouth.     tamoxifen  (NOLVADEX ) 10 MG tablet Take 1 tablet (10 mg total) by mouth 2 (two) times daily. 60 tablet 3   triamcinolone ointment (KENALOG) 0.5 % Apply topically.     UNABLE TO FIND daily. Med Name: Modere Ova supplement     No current facility-administered medications on file prior to visit.     Social History   Socioeconomic History   Marital status: Married    Spouse name: Not on file   Number of children: Not on file   Years of education: Not on file   Highest education level: Not on file  Occupational History   Not on file  Tobacco Use   Smoking status: Never   Smokeless tobacco: Never  Vaping Use   Vaping status: Never Used  Substance and Sexual Activity   Alcohol use: Yes    Alcohol/week: 3.0 - 5.0 standard drinks of alcohol    Types: 3 - 5 Glasses of wine per week   Drug use: Yes    Types: Marijuana    Comment: occ   Sexual activity: Yes    Partners: Male    Birth control/protection: Other-see comments, Surgical    Comment: husband vasectomy, hysterectomy  Other Topics Concern   Not on file  Social History Narrative   Not on file   Social Drivers of Health   Financial Resource Strain: Low Risk  (08/08/2021)   Overall Financial Resource Strain (CARDIA)    Difficulty of Paying Living Expenses: Not hard at all  Food Insecurity: No Food Insecurity (08/08/2021)   Hunger Vital Sign    Worried About Running Out of Food in the Last Year: Never true    Ran Out of Food in the Last Year: Never true  Transportation Needs: No Transportation Needs (08/08/2021)   PRAPARE - Administrator, Civil Service (Medical): No    Lack of Transportation (Non-Medical): No  Physical Activity: Sufficiently Active (08/08/2021)   Exercise Vital Sign    Days of Exercise per Week: 6 days    Minutes of Exercise per Session: 60 min  Stress: No Stress Concern Present (08/08/2021)   [employer] of Occupational Health - Occupational Stress Questionnaire    Feeling of Stress : Only a little  Social Connections: Moderately Isolated (08/08/2021)   Social Connection and Isolation Panel [NHANES]    Frequency of Communication with Friends and Family: More than three times a week    Frequency of Social Gatherings with Friends and Family: Twice a week    Attends Religious  Services: Never    Database Administrator or Organizations: No    Attends Banker Meetings: Never    Marital Status: Married  Catering Manager Violence: Not At Risk (08/08/2021)   Humiliation, Afraid, Rape, and Kick questionnaire    Fear of Current or Ex-Partner: No    Emotionally Abused: No    Physically Abused: No    Sexually Abused: No  Family History  Problem Relation Age of Onset   Diabetes Mother    Heart attack Paternal Grandfather    Hypertension Father    Breast cancer Paternal Grandmother        dx 56s   Diabetes Maternal Grandmother    Diabetes Maternal Grandfather      Allergies  Allergen Reactions   Other Hives    Chlorhexidine  skin prep   Penicillins Rash    Tolerates ancef    Sulfamethoxazole-Trimethoprim Rash      Patient's last menstrual period was Patient's last menstrual period was 09/26/2022.Ann Buckley            Review of Systems Alls systems reviewed and are negative.     Physical Exam Constitutional:      Appearance: Normal appearance.  Genitourinary:     Vulva normal.     No lesions in the vagina.     Right Labia: No rash, lesions or skin changes.    Left Labia: No lesions, skin changes or rash.    Vaginal cuff intact.    No vaginal discharge or tenderness.     No vaginal prolapse present.    No vaginal atrophy present.     Right Adnexa: absent.    Left Adnexa: absent.    Cervix is absent.     Uterus is absent.  Breasts:    Right: Normal.     Left: Normal.  HENT:     Head: Normocephalic.     Comments: Possible right thyroid  nodule Neck:     Thyroid : No thyroid  mass, thyromegaly or thyroid  tenderness.  Cardiovascular:     Rate and Rhythm: Normal rate and regular rhythm.     Heart sounds: Normal heart sounds, S1 normal and S2 normal.  Pulmonary:     Effort: Pulmonary effort is normal.     Breath sounds: Normal breath sounds and air entry.     Comments: Right breast with surgical scar-healed Abdominal:     General:  There is no distension.     Palpations: Abdomen is soft. There is no mass.     Tenderness: There is no abdominal tenderness. There is no guarding or rebound.  Musculoskeletal:        General: Normal range of motion.     Cervical back: Full passive range of motion without pain, normal range of motion and neck supple. No tenderness.     Right lower leg: No edema.     Left lower leg: No edema.  Neurological:     Mental Status: She is alert.  Skin:    General: Skin is warm.  Psychiatric:        Mood and Affect: Mood normal.        Behavior: Behavior normal.        Thought Content: Thought content normal.  Vitals and nursing note reviewed. Exam conducted with a chaperone present.       A:         Well Woman GYN exam                             P:        Pap smear up-to-date h/o LEEP in 2001 normal after. To repeat at 3 years from last one Encouraged annual mammogram screening Colon cancer screening referral placed today DXA not indicated but discussed running early due to surgical menopause Labs and immunizations to do with PMD Referral for thyroid  ultrasound placed to  evaluate for possible thyroid  nodule Encouraged healthy lifestyle practices Encouraged Vit D and Calcium   No follow-ups on file.  Ann Buckley

## 2023-08-05 ENCOUNTER — Ambulatory Visit: Payer: 59 | Attending: General Surgery

## 2023-08-05 VITALS — Wt 162.4 lb

## 2023-08-05 DIAGNOSIS — Z483 Aftercare following surgery for neoplasm: Secondary | ICD-10-CM | POA: Insufficient documentation

## 2023-08-05 NOTE — Therapy (Signed)
 OUTPATIENT PHYSICAL THERAPY SOZO SCREENING NOTE   Patient Name: Ann Buckley MRN: [id number] DOB:01/29/1981, 43 y.o., female Today's Date: 08/05/2023  PCP: Verdia Lombard, MD REFERRING PROVIDER: Curvin Deward MOULD, MD   PT End of Session - 08/05/23 0820     Visit Number 1   # unchanged due to screen only   PT Start Time 0818    PT Stop Time 0823    PT Time Calculation (min) 5 min    Activity Tolerance Patient tolerated treatment well    Behavior During Therapy [facility] for tasks assessed/performed             Past Medical History:  Diagnosis Date   Abnormal Pap smear of cervix 2001   Anxiety    Follows w/ PCP, Dr. Verdia.   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   Family history of breast cancer    PCOS (polycystic ovarian syndrome)    Personal history of radiation therapy    right breast, 04/04/21 - 05/17/21   STD (sexually transmitted disease)    hsv1   Uterine fibroid    Wears contact lenses    Wears glasses    Past Surgical History:  Procedure Laterality Date   BREAST LUMPECTOMY Right 01/18/2021   BREAST LUMPECTOMY WITH RADIOACTIVE SEED LOCALIZATION Right 01/18/2021   Procedure: RIGHT BREAST LUMPECTOMY WITH RADIOACTIVE SEED LOCALIZATION X 2;  Surgeon: Curvin Deward MOULD, MD;  Location: [facility] OR;  Service: General;  Laterality: Right;   CERVICAL BIOPSY  W/ LOOP ELECTRODE EXCISION  Age 45 2001   Paps have been normal eversince    COLPOSCOPY     ENDOMETRIAL BIOPSY  09/28/2022   INTRAUTERINE DEVICE (IUD) INSERTION  07/2017   Mirena     ROBOTIC ASSISTED TOTAL HYSTERECTOMY WITH BILATERAL SALPINGO OOPHERECTOMY Bilateral 10/16/2022   Procedure: XI ROBOTIC ASSISTED TOTAL HYSTERECTOMY WITH BILATERAL SALPINGO OOPHORECTOMY;  Surgeon: Lavoie, Marie-Lyne, MD;  Location: [facility] [place];  Service: Gynecology;  Laterality: Bilateral;   SENTINEL NODE BIOPSY Right 02/21/2021   Procedure: RIGHT  SENTINEL NODE BIOPSY;  Surgeon: Curvin Deward MOULD, MD;  Location: [facility];  Service: General;  Laterality: Right;  60 MINUTES ROOM 5   Patient Active Problem List   Diagnosis Date Noted   Sensorineural hearing loss, unilateral, left ear, 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 Diagnostic mammogram and US  showed 3 groups of calcifications in the right breast, largest 1.4cm, with two smaller measuring 0.1cm. 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  She had a right lumpectomy performed on 12/29/20 and is pending a SLNB on 02/21/2021.  She also had a positive genetic test.She will have adjuvant radiation and anti estrogens; hysterectomy Mar 2024  PRECAUTIONS: right UE Lymphedema risk, None  SUBJECTIVE: Pt returns for her first 6 month L-Dex screen.   PAIN:  Are you having pain? No  SOZO SCREENING:  Patient was assessed today using the SOZO machine to determine the lymphedema index score. This was compared to her baseline score. It was determined that she is within the recommended range when compared to her baseline and no further action is needed at this time. She will continue SOZO screenings. These are done every 3 months for 2 years post operatively followed by every 6 months for 2 years, and then annually.   L-DEX FLOWSHEETS - 08/05/23 0800       L-DEX LYMPHEDEMA SCREENING    Measurement Type Unilateral    L-DEX MEASUREMENT EXTREMITY Upper Extremity    POSITION  Standing    DOMINANT SIDE Right    At Risk Side Right    BASELINE SCORE (UNILATERAL) -1.7    L-DEX SCORE (UNILATERAL) -1.3    VALUE CHANGE (UNILAT) 0.4            P: Cont 6 month L-Dex screens next.   Aden Berwyn Caldron, PTA 08/05/2023, 8:23 AM

## 2023-08-06 ENCOUNTER — Encounter: Payer: Self-pay | Admitting: Obstetrics and Gynecology

## 2023-08-06 NOTE — Telephone Encounter (Signed)
 08/01/23 AEX reviewed. Routing to Dr. Karma Greaser to advise.

## 2023-08-09 ENCOUNTER — Ambulatory Visit
Admission: RE | Admit: 2023-08-09 | Discharge: 2023-08-09 | Disposition: A | Discharge status: Home / Self Care | Payer: 59 | Source: Ambulatory Visit | Attending: Obstetrics and Gynecology | Admitting: Obstetrics and Gynecology

## 2023-08-09 ENCOUNTER — Ambulatory Visit
Admission: RE | Admit: 2023-08-09 | Discharge: 2023-08-09 | Disposition: A | Discharge status: Home / Self Care | Payer: 59 | Source: Ambulatory Visit | Attending: Urology | Admitting: Urology

## 2023-08-09 DIAGNOSIS — Z1289 Encounter for screening for malignant neoplasm of other sites: Secondary | ICD-10-CM

## 2023-08-09 DIAGNOSIS — Z01419 Encounter for gynecological examination (general) (routine) without abnormal findings: Secondary | ICD-10-CM

## 2023-08-09 DIAGNOSIS — E041 Nontoxic single thyroid nodule: Secondary | ICD-10-CM

## 2023-08-12 ENCOUNTER — Encounter: Payer: Self-pay | Admitting: Obstetrics and Gynecology

## 2023-09-18 ENCOUNTER — Encounter: Payer: Self-pay | Admitting: Gastroenterology

## 2023-10-24 ENCOUNTER — Ambulatory Visit
Admission: RE | Admit: 2023-10-24 | Discharge: 2023-10-24 | Disposition: A | Discharge status: Home / Self Care | Source: Ambulatory Visit | Attending: Hematology and Oncology | Admitting: Hematology and Oncology

## 2023-10-24 ENCOUNTER — Other Ambulatory Visit: Payer: Self-pay | Admitting: Hematology and Oncology

## 2023-10-24 DIAGNOSIS — Z170 Estrogen receptor positive status [ER+]: Secondary | ICD-10-CM

## 2023-10-24 MED ORDER — IOPAMIDOL (ISOVUE-370) INJECTION 76%
100.0000 mL | Freq: Once | INTRAVENOUS | Status: AC | PRN
  Administered 2023-10-24: 100 mL via INTRAVENOUS

## 2023-10-31 ENCOUNTER — Encounter: Payer: Self-pay | Admitting: Hematology and Oncology

## 2023-11-11 LAB — SIGNATERA
SIGNATERA MTM READOUT: 0 MTM/ml
SIGNATERA TEST RESULT: NEGATIVE

## 2023-11-13 ENCOUNTER — Encounter: Payer: Self-pay | Admitting: Adult Health

## 2023-11-13 ENCOUNTER — Inpatient Hospital Stay: Attending: Adult Health | Admitting: Adult Health

## 2023-11-13 DIAGNOSIS — Z170 Estrogen receptor positive status [ER+]: Secondary | ICD-10-CM | POA: Diagnosis not present

## 2023-11-13 DIAGNOSIS — C50411 Malignant neoplasm of upper-outer quadrant of right female breast: Secondary | ICD-10-CM | POA: Diagnosis not present

## 2023-11-13 DIAGNOSIS — E2839 Other primary ovarian failure: Secondary | ICD-10-CM

## 2023-11-13 DIAGNOSIS — Z13820 Encounter for screening for osteoporosis: Secondary | ICD-10-CM | POA: Diagnosis not present

## 2023-11-13 NOTE — Progress Notes (Signed)
 [facility] Cancer Follow up:    Ann Grime, MD [address] [place] [address]   DIAGNOSIS:  Cancer Staging  Malignant neoplasm of upper outer quadrant of female breast [facility]) Staging form: Breast, AJCC 8th Edition - Clinical stage from 12/14/2020: Stage 0 (cTis (DCIS), cN0, cM0, G2, ER+, PR+, HER2: Not Assessed) - Signed by Cameron Cea, MD on 12/14/2020 Stage prefix: Initial diagnosis Histologic grading system: 3 grade system - Pathologic stage from 01/18/2021: Stage IA (pT1a, pN0, cM0, G1, ER+, PR+, HER2-) - Signed by Percival Brace, NP on 07/28/2021 Stage prefix: Initial diagnosis Histologic grading system: 3 grade system  I connected with Ann Buckley on 11/17/23 at  9:40 AM EDT by telephone and verified that I am speaking with the correct person using two identifiers.  I discussed the limitations, risks, security and privacy concerns of performing an evaluation and management service by telephone and the availability of in person appointments.  I also discussed with the patient that there may be a patient responsible charge related to this service. The patient expressed understanding and agreed to proceed.  Patient location: home Provider location: [facility] office  SUMMARY OF ONCOLOGIC HISTORY: Oncology History  Malignant neoplasm of upper outer quadrant of female breast (HCC)  12/09/2020 Initial Diagnosis   Screening mammogram showed right breast calcifications. Diagnostic mammogram and US  showed 3 groups of calcifications in the right breast, largest 1.4cm, with two smaller measuring 0.1cm. 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 Testing was completed through the BRCAplus and CancerNext-Expanded + RNAinsight panels offered by [facility] laboratories. The report date is 12/28/2020.   The BRCAplus panel offered by [facility] and includes sequencing and deletion/duplication analysis for the following 8 genes: ATM, BRCA1, BRCA2, CDH1, CHEK2, PALB2, PTEN, and TP53. The CancerNext-Expanded + RNAinsight gene panel offered by [facility] and includes sequencing and rearrangement analysis for the following 77 genes: AIP, ALK, APC, ATM, AXIN2, BAP1, BARD1, BLM, BMPR1A, BRCA1, BRCA2, BRIP1, CDC73, CDH1, CDK4, CDKN1B, CDKN2A, CHEK2, CTNNA1, DICER1, FANCC, FH, FLCN, GALNT12, KIF1B, LZTR1, MAX, MEN1, MET, MLH1, MSH2, MSH3, MSH6, MUTYH, NBN, NF1, NF2, NTHL1, PALB2, PHOX2B, PMS2, POT1, PRKAR1A, PTCH1, PTEN, RAD51C, RAD51D, RB1, RECQL, RET, SDHA, SDHAF2, SDHB, SDHC, SDHD, SMAD4, SMARCA4, SMARCB1, SMARCE1, STK11, SUFU, TMEM127, TP53, TSC1, TSC2, VHL and XRCC2 (sequencing and deletion/duplication); EGFR, EGLN1, HOXB13, KIT, MITF, PDGFRA, POLD1 and POLE (sequencing only); EPCAM and GREM1 (deletion/duplication only). RNA data is routinely analyzed for use in variant interpretation for all genes.   01/18/2021 Surgery   Right lumpectomy: Incidental focus of IDC grade 1, 0.2 cm, foci of DCIS intermediate grade with focal necrosis, margins negative, ER 60%, PR 80% T1 a N0 stage Ia   01/18/2021 Cancer Staging   Staging form: Breast, AJCC 8th Edition - Pathologic stage from 01/18/2021: Stage IA (pT1a, pN0, cM0, G1, ER+, PR+, HER2-) - Signed by Percival Brace, NP on 07/28/2021 Stage prefix: Initial diagnosis Histologic grading system: 3 grade system   04/03/2021 - 05/17/2021 Radiation Therapy   Site Technique Total Dose (Gy)  Dose per Fx (Gy) Completed Fx Beam Energies  Breast,  Right: Breast_Rt 3D 50.4/50.4 1.8 28/28 6X  Breast, Right: Breast_Rt_Bst 3D 10/10 2 5/5 6X     06/11/2021 -  Anti-estrogen oral therapy   Tamoxifen , started at half dose, increased to full dose 06/25/21     CURRENT THERAPY: tamoxifen   INTERVAL HISTORY:  Discussed the use of AI scribe software for clinical note transcription with the patient, who gave verbal consent to proceed.  Ann Buckley 43 y.o. female a 43 year old woman with a history of breast cancer, presents with menopausal symptoms following her oophorectomy, radiation, and initiation of tamoxifen . She reports significant sleep disruption, body aches, and hot flashes. The patient notes that the severity of these symptoms seems to fluctuate, with body aches currently being the most prominent issue. She has been proactive in seeking ways to manage these symptoms naturally and has consulted with a functional medicine doctor. The patient also reports a history of fibroids, which she believes increased in size and number after discontinuing her IUD and starting tamoxifen . This led to a hysterectomy and oophorectomy one year ago. The patient is concerned about her bone health and reports a feeling of bone deterioration due to the aches. She is also interested in the potential of reducing her tamoxifen  dosage and incorporating food-based estrogens into her diet to manage her symptoms.   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  MEDICAL HISTORY: Past Medical History:  Diagnosis Date   Abnormal Pap smear of cervix 2001   Anxiety    Follows w/ PCP, Dr. Gloria Buckley.   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 Marjo Sievert, oncologist.   Family history of breast cancer    PCOS (polycystic ovarian syndrome)    Personal history of radiation therapy    right breast, 04/04/21 - 05/17/21   STD (sexually transmitted disease)    hsv1   Uterine fibroid    Wears contact lenses    Wears glasses     SURGICAL HISTORY: Past Surgical History:  Procedure Laterality Date   BREAST LUMPECTOMY Right 01/18/2021   BREAST LUMPECTOMY WITH RADIOACTIVE SEED LOCALIZATION Right 01/18/2021   Procedure: RIGHT BREAST LUMPECTOMY WITH RADIOACTIVE SEED LOCALIZATION X 2;  Surgeon: Caralyn Chandler, MD;  Location: [facility] OR;  Service: General;  Laterality: Right;   CERVICAL BIOPSY  W/ LOOP ELECTRODE EXCISION  Age 48 2001   Paps have been normal eversince    COLPOSCOPY     ENDOMETRIAL BIOPSY  09/28/2022   INTRAUTERINE DEVICE (IUD) INSERTION  07/2017   Mirena     ROBOTIC ASSISTED TOTAL HYSTERECTOMY WITH BILATERAL SALPINGO OOPHERECTOMY Bilateral 10/16/2022   Procedure: XI ROBOTIC ASSISTED TOTAL HYSTERECTOMY WITH BILATERAL SALPINGO OOPHORECTOMY;  Surgeon: Lavoie, Marie-Lyne, MD;  Location: [facility];  Service: Gynecology;  Laterality: Bilateral;   SENTINEL NODE BIOPSY Right 02/21/2021   Procedure: RIGHT SENTINEL NODE BIOPSY;  Surgeon: Caralyn Chandler, MD;  Location: [facility];  Service: General;  Laterality: Right;  60 MINUTES ROOM 5    SOCIAL HISTORY: Social History   Socioeconomic History   Marital status: Married    Spouse name: Not on file   Number of children: Not on file   Years of education: Not on file   Highest education level: Not on file  Occupational History   Not on file  Tobacco Use   Smoking status: Never    Smokeless tobacco: Never  Vaping Use   Vaping status: Never Used  Substance and Sexual Activity   Alcohol use: Yes    Alcohol/week: 3.0 - 5.0 standard drinks of alcohol    Types: 3 - 5 Glasses of wine per week   Drug use: Yes    Types: Marijuana    Comment: occ   Sexual activity: Yes    Partners: Male    Birth control/protection: Other-see comments, Surgical    Comment: husband vasectomy, hysterectomy  Other Topics Concern   Not on file  Social History Narrative   Not on file   Social Drivers of Health   Financial Resource Strain: Low Risk  (08/08/2021)   Overall Financial Resource Strain (CARDIA)    Difficulty of Paying Living Expenses: Not hard at all  Food Insecurity: No Food Insecurity (08/08/2021)   Hunger Vital Sign    Worried About Running Out of Food in the Last Year: Never true    Ran Out of Food in the Last Year: Never true  Transportation Needs: No Transportation Needs (08/08/2021)   PRAPARE - Administrator, Civil Service (Medical): No    Lack of Transportation (Non-Medical): No  Physical Activity: Sufficiently Active (08/08/2021)   Exercise Vital Sign    Days of Exercise per Week: 6 days    Minutes of Exercise per Session: 60 min  Stress: No Stress Concern Present (08/08/2021)   [employer] of Occupational Health - Occupational Stress Questionnaire    Feeling of Stress : Only a little  Social Connections: Moderately Isolated (08/08/2021)   Social Connection and Isolation Panel [NHANES]    Frequency of Communication with Friends and Family: More than three times a week    Frequency of Social Gatherings with Friends and Family: Twice a week    Attends Religious Services: Never    Database administrator or Organizations: No    Attends Banker Meetings: Never    Marital Status: Married  Catering manager Violence: Not At Risk (08/08/2021)   Humiliation, Afraid, Rape, and Kick questionnaire    Fear of Current or Ex-Partner: No     Emotionally Abused: No    Physically Abused: No    Sexually Abused: No    FAMILY HISTORY: Family History  Problem Relation Age of Onset   Diabetes Mother    Heart attack Paternal Grandfather    Hypertension Father    Breast cancer Paternal Grandmother        dx 78s   Diabetes Maternal Grandmother    Diabetes Maternal Grandfather     Review of Systems  Constitutional:  Positive for fatigue. Negative for appetite change, chills, fever and unexpected weight change.  HENT:   Negative for hearing loss, lump/mass and trouble swallowing.   Eyes:  Negative for eye problems and icterus.  Respiratory:  Negative for chest tightness, cough and shortness of breath.   Cardiovascular:  Negative for chest pain, leg swelling and palpitations.  Gastrointestinal:  Negative for abdominal distention, abdominal pain, constipation, diarrhea, nausea  and vomiting.  Endocrine: Positive for hot flashes.  Genitourinary:  Negative for difficulty urinating.   Musculoskeletal:  Positive for arthralgias.  Skin:  Negative for itching and rash.  Neurological:  Negative for dizziness, extremity weakness, headaches and numbness.  Hematological:  Negative for adenopathy. Does not bruise/bleed easily.  Psychiatric/Behavioral:  Negative for depression. The patient is not nervous/anxious.       PHYSICAL EXAMINATION Patient sounds well is in no apparent distress, mood and behavior are normal.     ASSESSMENT and THERAPY PLAN:   Malignant neoplasm of upper outer quadrant of female breast (HCC) 12/09/2020:Screening mammogram showed right breast calcifications. Diagnostic mammogram and US  showed 3 groups of calcifications in the right breast, largest 1.4cm, with two smaller measuring 0.1cm. 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   Treatment plan: 1. Breast conserving surgery Right lumpectomy: Incidental focus of IDC grade 1, 0.2 cm, foci of DCIS intermediate grade with focal necrosis, margins  negative, ER 60%, PR 80%, Ki-67 2%, HER2 negative T1 a N0 stage Ia 2. sentinel lymph node study 02/21/2021: 0/5 lymph nodes negative 3. Followed by adjuvant radiation therapy 04/04/2021-05/17/2021 4. Followed by antiestrogen therapy with tamoxifen  5-10 years started November 2022 switched to anastrozole 12/06/2022 5. 10/16/2022: Hysterectomy with bilateral salpingo-oophorectomy  ------------------------------------------------------------------------------------------------------------------------------------------ Breast cancer surveillance: 1. Contrast enhanced mammogram 10/24/2023: Benign breast density category C 2.  Breast MRI 05/06/2023: Benign breast density category C 3. Signatera for MRD  Breast cancer DCIS with small IDC component treated with lumpectomy and radiation. On tamoxifen  since November 2022. Considering dose reduction due to menopausal symptoms and bone density concerns. Current estradiol level consistent with post-oophorectomy status. Discussed tamoxifen  reduction to manage symptoms and weight, supported by literature. - Reduce tamoxifen  to 10 mg daily or 10 mg every other day based on symptom management. - Order lab test to check estrogen levels before appointment with Dr. Gudina in September.  Menopausal symptoms post-oophorectomy Significant symptoms including sleep disruption, bone and body aches, and hot flashes due to low estrogen and tamoxifen . Not interested in hormone replacement therapy. Discussed safe use of natural phytoestrogens like soy milk and flaxseed. - Incorporate soy milk and flaxseed into diet for phytoestrogen benefits. - Continue current supplement regimen including vitamin D3 and magnesium.  Fibroids Fibroids increased post-IUD discontinuation and tamoxifen  initiation. Underwent hysterectomy and oophorectomy in March 2024.  Follow-up Plans to monitor treatment efficacy and manage symptoms. - Order bone density scan at [facility] location for baseline  assessment. - Schedule lab test before appointment with Dr. Gudina in September. - Bring any additional lab results from functional specialist to the September appointment.        The patient was provided an opportunity to ask questions and all were answered. The patient agreed with the plan and demonstrated an understanding of the instructions.   The patient was advised to call back or seek an in-person evaluation if the symptoms worsen or if the condition fails to improve as anticipated.   I provided 15 minutes of non face-to-face telephone visit time during this encounter, and > 50% was spent counseling as documented under my assessment & plan.    Alwin Baars, NP 11/17/23 9:29 PM Medical Oncology and Hematology [facility] [address] [place], [place] [address] Tel. [phone]    Fax. [phone]  *Total Encounter Time as defined by the Centers for Medicare and Medicaid Services includes, in addition to the face-to-face time of a patient visit (documented in the note above) non-face-to-face time:  obtaining and reviewing outside history, ordering and reviewing medications, tests or procedures, care coordination (communications with other health care professionals or caregivers) and documentation in the medical record.

## 2023-11-14 ENCOUNTER — Telehealth: Payer: Self-pay | Admitting: Hematology and Oncology

## 2023-11-14 NOTE — Telephone Encounter (Signed)
 Confirmed with pt about added appoinment.

## 2023-11-15 ENCOUNTER — Ambulatory Visit: Payer: 59 | Admitting: Gastroenterology

## 2023-11-17 NOTE — Assessment & Plan Note (Signed)
 12/09/2020:Screening mammogram showed right breast calcifications. Diagnostic mammogram and US  showed 3 groups of calcifications in the right breast, largest 1.4cm, with two smaller measuring 0.1cm. 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   Treatment plan: 1. Breast conserving surgery Right lumpectomy: Incidental focus of IDC grade 1, 0.2 cm, foci of DCIS intermediate grade with focal necrosis, margins negative, ER 60%, PR 80%, Ki-67 2%, HER2 negative T1 a N0 stage Ia 2. sentinel lymph node study 02/21/2021: 0/5 lymph nodes negative 3. Followed by adjuvant radiation therapy 04/04/2021-05/17/2021 4. Followed by antiestrogen therapy with tamoxifen  5-10 years started November 2022 switched to anastrozole 12/06/2022 5. 10/16/2022: Hysterectomy with bilateral salpingo-oophorectomy  ------------------------------------------------------------------------------------------------------------------------------------------ Breast cancer surveillance: 1. Contrast enhanced mammogram 10/24/2023: Benign breast density category C 2.  Breast MRI 05/06/2023: Benign breast density category C 3. Signatera for MRD  Breast cancer DCIS with small IDC component treated with lumpectomy and radiation. On tamoxifen  since November 2022. Considering dose reduction due to menopausal symptoms and bone density concerns. Current estradiol level consistent with post-oophorectomy status. Discussed tamoxifen  reduction to manage symptoms and weight, supported by literature. - Reduce tamoxifen  to 10 mg daily or 10 mg every other day based on symptom management. - Order lab test to check estrogen levels before appointment with Dr. Gudina in September.  Menopausal symptoms post-oophorectomy Significant symptoms including sleep disruption, bone and body aches, and hot flashes due to low estrogen and tamoxifen . Not interested in hormone replacement therapy. Discussed safe use of natural phytoestrogens like soy milk and  flaxseed. - Incorporate soy milk and flaxseed into diet for phytoestrogen benefits. - Continue current supplement regimen including vitamin D3 and magnesium.  Fibroids Fibroids increased post-IUD discontinuation and tamoxifen  initiation. Underwent hysterectomy and oophorectomy in March 2024.  Follow-up Plans to monitor treatment efficacy and manage symptoms. - Order bone density scan at [facility] location for baseline assessment. - Schedule lab test before appointment with Dr. Gudina in September. - Bring any additional lab results from functional specialist to the September appointment.

## 2023-11-21 ENCOUNTER — Other Ambulatory Visit: Payer: Self-pay | Admitting: Hematology and Oncology

## 2023-11-21 NOTE — Telephone Encounter (Signed)
 Received refill request for Tamoxifen  10 mg p.o BID.  RN placed call to pt for clarification.  Pt states she is taking 10 mg once a day.  RN reviewed with MD and verbal orders received and placed for pt to take Tamoxifen  10 mg p.o once a day.  Prescription sent to pharmacy on file.

## 2023-11-25 ENCOUNTER — Ambulatory Visit (HOSPITAL_BASED_OUTPATIENT_CLINIC_OR_DEPARTMENT_OTHER)
Admission: RE | Admit: 2023-11-25 | Discharge: 2023-11-25 | Disposition: A | Discharge status: Home / Self Care | Source: Ambulatory Visit | Attending: Adult Health | Admitting: Adult Health

## 2023-11-25 DIAGNOSIS — Z170 Estrogen receptor positive status [ER+]: Secondary | ICD-10-CM | POA: Insufficient documentation

## 2023-11-25 DIAGNOSIS — E2839 Other primary ovarian failure: Secondary | ICD-10-CM | POA: Diagnosis present

## 2023-11-25 DIAGNOSIS — Z13820 Encounter for screening for osteoporosis: Secondary | ICD-10-CM | POA: Diagnosis present

## 2023-11-25 DIAGNOSIS — C50411 Malignant neoplasm of upper-outer quadrant of right female breast: Secondary | ICD-10-CM | POA: Diagnosis present

## 2023-11-26 ENCOUNTER — Telehealth: Payer: Self-pay | Admitting: *Deleted

## 2023-11-26 NOTE — Telephone Encounter (Signed)
-----   Message from Conway Dennis sent at 11/26/2023 12:38 PM EDT ----- Please call and give patient the good news that her bone density is normal. ----- Message ----- From: Interface, Rad Results In Sent: 11/25/2023   4:17 PM EDT To: Percival Brace, NP

## 2023-11-26 NOTE — Telephone Encounter (Signed)
 Per Lillard Anes, NP, called pt to make aware of message below. Pt verbalized understanding

## 2023-12-17 NOTE — Progress Notes (Unsigned)
 Chief Complaint:screening colonoscopy Primary GI Doctor:***  HPI:  Patient is a  43  year old female patient with past medical history of Breast Ca, anxiety, PCOS*****who was referred to me by Reinaldo Caras, MD on 08/01/23 for a complaint of screening colonoscopy .    Interval History  Patient admits/denies GERD Patient admits/denies dysphagia Patient admits/denies nausea, vomiting, or weight loss  Patient admits/denies altered bowel habits Patient admits/denies abdominal pain Patient admits/denies rectal bleeding   Denies/Admits alcohol Denies/Admits smoking Denies/Admits NSAID use. Denies/Admits they are on blood thinners.  Patients last colonoscopy Patients last EGD  Patient's family history includes  Wt Readings from Last 3 Encounters:  08/05/23 162 lb 6 oz (73.7 kg)  08/01/23 157 lb (71.2 kg)  06/12/23 150 lb (68 kg)      Past Medical History:  Diagnosis Date   Abnormal Pap smear of cervix 2001   Anxiety    Follows w/ PCP, Dr. Gloria Lares.   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 Marjo Sievert, oncologist.   Family history of breast cancer    PCOS (polycystic ovarian syndrome)    Personal history of radiation therapy    right breast, 04/04/21 - 05/17/21   STD (sexually transmitted disease)    hsv1   Uterine fibroid    Wears contact lenses    Wears glasses     Past Surgical History:  Procedure Laterality Date   BREAST LUMPECTOMY Right 01/18/2021   BREAST LUMPECTOMY WITH RADIOACTIVE SEED LOCALIZATION Right 01/18/2021   Procedure: RIGHT BREAST LUMPECTOMY WITH RADIOACTIVE SEED LOCALIZATION X 2;  Surgeon: Caralyn Chandler, MD;  Location: [facility];  Service: General;  Laterality: Right;   CERVICAL BIOPSY  W/ LOOP ELECTRODE EXCISION  Age 35 2001   Paps have been normal eversince    COLPOSCOPY     ENDOMETRIAL BIOPSY  09/28/2022   INTRAUTERINE DEVICE (IUD) INSERTION  07/2017   Mirena     ROBOTIC ASSISTED TOTAL  HYSTERECTOMY WITH BILATERAL SALPINGO OOPHERECTOMY Bilateral 10/16/2022   Procedure: XI ROBOTIC ASSISTED TOTAL HYSTERECTOMY WITH BILATERAL SALPINGO OOPHORECTOMY;  Surgeon: Lavoie, Marie-Lyne, MD;  Location: [facility];  Service: Gynecology;  Laterality: Bilateral;   SENTINEL NODE BIOPSY Right 02/21/2021   Procedure: RIGHT SENTINEL NODE BIOPSY;  Surgeon: Caralyn Chandler, MD;  Location: [facility];  Service: General;  Laterality: Right;  60 MINUTES ROOM 5    Current Outpatient Medications  Medication Sig Dispense Refill   ALPRAZolam (XANAX) 0.5 MG tablet Take 1 mg by mouth 2 (two) times daily as needed.     Cholecalciferol (VITAMIN D3) 50 MCG (2000 UT) TABS Take 1 capsule by mouth daily.     fluocinolone (SYNALAR) 0.01 % external solution SMARTSIG:Topical 2-3 Times Weekly     fluticasone (FLONASE) 50 MCG/ACT nasal spray Place into both nostrils as needed for allergies or rhinitis.     hydrOXYzine (ATARAX) 25 MG tablet Take 25 mg by mouth 3 (three) times daily as needed.     ketoconazole (NIZORAL) 2 % shampoo SMARTSIG:5 Milliliter(s) Topical 2-3 Times Daily     MAGNESIUM PO Take by mouth every evening.     Multiple Vitamin (MULTIVITAMIN PO) Take by mouth.     Probiotic Product (PROBIOTIC PO) Take by mouth.     tamoxifen  (NOLVADEX ) 10 MG tablet Take 1 tablet (10 mg total) by mouth daily. 90 tablet 3   triamcinolone ointment (KENALOG) 0.5 % Apply topically.     UNABLE TO FIND  daily. Med Name: Modere Ova supplement     No current facility-administered medications for this visit.    Allergies as of 12/18/2023 - Review Complete 08/01/2023  Allergen Reaction Noted   Other Hives 02/14/2021   Penicillins Rash 05/04/2014   Sulfamethoxazole-trimethoprim Rash 07/28/2017    Family History  Problem Relation Age of Onset   Diabetes Mother    Heart attack Paternal Grandfather    Hypertension Father    Breast cancer Paternal Grandmother        dx 59s   Diabetes Maternal  Grandmother    Diabetes Maternal Grandfather     Review of Systems:    Constitutional: No weight loss, fever, chills, weakness or fatigue HEENT: Eyes: No change in vision               Ears, Nose, Throat:  No change in hearing or congestion Skin: No rash or itching Cardiovascular: No chest pain, chest pressure or palpitations   Respiratory: No SOB or cough Gastrointestinal: See HPI and otherwise negative Genitourinary: No dysuria or change in urinary frequency Neurological: No headache, dizziness or syncope Musculoskeletal: No new muscle or joint pain Hematologic: No bleeding or bruising Psychiatric: No history of depression or anxiety    Physical Exam:  Vital signs: LMP 09/26/2022   Constitutional:   Pleasant *** female appears to be in NAD, Well developed, Well nourished, alert and cooperative Head:  Normocephalic and atraumatic. Eyes:   PEERL, EOMI. No icterus. Conjunctiva pink. Ears:  Normal auditory acuity. Neck:  Supple Throat: Oral cavity and pharynx without inflammation, swelling or lesion.  Respiratory: Respirations even and unlabored. Lungs clear to auscultation bilaterally.   No wheezes, crackles, or rhonchi.  Cardiovascular: Normal S1, S2. Regular rate and rhythm. No peripheral edema, cyanosis or pallor.  Gastrointestinal:  Soft, nondistended, nontender. No rebound or guarding. Normal bowel sounds. No appreciable masses or hepatomegaly. Rectal:  Not performed.  Anoscopy: Msk:  Symmetrical without gross deformities. Without edema, no deformity or joint abnormality.  Neurologic:  Alert and  oriented x4;  grossly normal neurologically.  Skin:   Dry and intact without significant lesions or rashes. Psychiatric: Oriented to person, place and time. Demonstrates good judgement and reason without abnormal affect or behaviors.  RELEVANT LABS AND IMAGING: CBC    Latest Ref Rng & Units 10/16/2022    1:25 PM 10/10/2022    8:56 AM 01/11/2021    9:19 AM  CBC  WBC 4.0 - 10.5  K/uL 13.1  5.1  5.3   Hemoglobin 12.0 - 15.0 g/dL 16.1  09.6  04.5   Hematocrit 36.0 - 46.0 % 40.7  39.4  41.4   Platelets 150 - 400 K/uL 315  333  322      CMP     Latest Ref Rng & Units 10/10/2022    8:56 AM 01/11/2021    9:19 AM 12/14/2020   12:18 PM  CMP  Glucose 70 - 99 mg/dL 90  84  409   BUN 6 - 20 mg/dL 15  9  10    Creatinine 0.44 - 1.00 mg/dL 8.11  9.14  7.82   Sodium 135 - 145 mmol/L 138  136  140   Potassium 3.5 - 5.1 mmol/L 3.9  4.1  3.9   Chloride 98 - 111 mmol/L 108  105  106   CO2 22 - 32 mmol/L 23  24  26    Calcium 8.9 - 10.3 mg/dL 9.1  9.6  9.5   Total Protein 6.5 -  8.1 g/dL  7.6  7.4   Total Bilirubin 0.3 - 1.2 mg/dL  1.1  0.6   Alkaline Phos 38 - 126 U/L  32  36   AST 15 - 41 U/L  19  16   ALT 0 - 44 U/L  15  10      No results found for: "TSH"   Assessment: 1. ***  Plan: 1. ***   Thank you for the courtesy of this consult. Please call me with any questions or concerns.   Nikelle Malatesta, FNP-C [facility] 12/17/2023, 6:19 PM  Cc: Reinaldo Caras, MD

## 2023-12-18 ENCOUNTER — Encounter: Payer: Self-pay | Admitting: Gastroenterology

## 2023-12-18 ENCOUNTER — Ambulatory Visit (INDEPENDENT_AMBULATORY_CARE_PROVIDER_SITE_OTHER): Admitting: Gastroenterology

## 2023-12-18 VITALS — BP 102/60 | HR 82 | Ht 66.0 in | Wt 151.0 lb

## 2023-12-18 DIAGNOSIS — K625 Hemorrhage of anus and rectum: Secondary | ICD-10-CM

## 2023-12-18 DIAGNOSIS — K649 Unspecified hemorrhoids: Secondary | ICD-10-CM

## 2023-12-18 DIAGNOSIS — K219 Gastro-esophageal reflux disease without esophagitis: Secondary | ICD-10-CM | POA: Diagnosis not present

## 2023-12-18 DIAGNOSIS — Z853 Personal history of malignant neoplasm of breast: Secondary | ICD-10-CM

## 2023-12-18 MED ORDER — NA SULFATE-K SULFATE-MG SULF 17.5-3.13-1.6 GM/177ML PO SOLN: 1.0000 | Freq: Once | ORAL | 0 refills | Status: AC

## 2023-12-18 NOTE — Progress Notes (Signed)
 I agree with the assessment and plan as outlined by Ms. May.

## 2023-12-18 NOTE — Patient Instructions (Addendum)
 OTC Pepcid prn heartburn GERD diet, no late meals 3-4 hours before lying down Elevate head of bed  Recommend High fiber diet No straining or pushing during bowel movement  You have been scheduled for an endoscopy and colonoscopy. Please follow the written instructions given to you at your visit today.  If you use inhalers (even only as needed), please bring them with you on the day of your procedure.  DO NOT TAKE 7 DAYS PRIOR TO TEST- Trulicity (dulaglutide) Ozempic, Wegovy (semaglutide) Mounjaro (tirzepatide) Bydureon Bcise (exanatide extended release)  DO NOT TAKE 1 DAY PRIOR TO YOUR TEST Rybelsus (semaglutide) Adlyxin (lixisenatide) Victoza (liraglutide) Byetta (exanatide) ___________________________________________________________________________ Due to recent changes in healthcare laws, you may see the results of your imaging and laboratory studies on MyChart before your provider has had a chance to review them.  We understand that in some cases there may be results that are confusing or concerning to you. Not all laboratory results come back in the same time frame and the provider may be waiting for multiple results in order to interpret others.  Please give us  48 hours in order for your provider to thoroughly review all the results before contacting the office for clarification of your results.  _______________________________________________________  If your blood pressure at your visit was 140/90 or greater, please contact your primary care physician to follow up on this.  _______________________________________________________  If you are age 8 or older, your body mass index should be between 23-30. Your Body mass index is 24.37 kg/m. If this is out of the aforementioned range listed, please consider follow up with your Primary Care Provider.  If you are age 35 or younger, your body mass index should be between 19-25. Your Body mass index is 24.37 kg/m. If this is out of  the aformentioned range listed, please consider follow up with your Primary Care Provider.   ________________________________________________________  The [facility] providers would like to encourage you to use MYCHART to communicate with providers for non-urgent requests or questions.  Due to long hold times on the telephone, sending your provider a message by [facility] may be a faster and more efficient way to get a response.  Please allow 48 business hours for a response.  Please remember that this is for non-urgent requests.  _______________________________________________________ Thank you for trusting me with your gastrointestinal care. Deanna May, RNP

## 2024-01-31 ENCOUNTER — Encounter: Admitting: Internal Medicine

## 2024-02-03 ENCOUNTER — Ambulatory Visit: Payer: 59

## 2024-02-07 ENCOUNTER — Encounter: Admitting: Internal Medicine

## 2024-02-17 ENCOUNTER — Ambulatory Visit: Attending: General Surgery

## 2024-02-17 VITALS — Wt 153.1 lb

## 2024-02-17 DIAGNOSIS — Z483 Aftercare following surgery for neoplasm: Secondary | ICD-10-CM | POA: Insufficient documentation

## 2024-02-17 NOTE — Therapy (Signed)
 OUTPATIENT PHYSICAL THERAPY SOZO SCREENING NOTE   Patient Name: Ann Buckley MRN: [id number] DOB:[birth year], 43 y.o., female Today's Date: 02/17/2024  PCP: Verdia Lombard, MD REFERRING PROVIDER: Curvin Deward MOULD, MD   PT End of Session - 02/17/24 9147     Visit Number 1   # unchanged due to screen only   PT Start Time 0850    PT Stop Time 0854    PT Time Calculation (min) 4 min    Activity Tolerance Patient tolerated treatment well    Behavior During Therapy [facility] for tasks assessed/performed          Past Medical History:  Diagnosis Date   Abnormal Pap smear of cervix 2001   Anxiety    Follows w/ PCP, Dr. Verdia.   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   Family history of breast cancer    PCOS (polycystic ovarian syndrome)    Personal history of radiation therapy    right breast, 04/04/21 - 05/17/21   STD (sexually transmitted disease)    hsv1   Uterine fibroid    Wears contact lenses    Wears glasses    Past Surgical History:  Procedure Laterality Date   BREAST LUMPECTOMY Right 01/18/2021   BREAST LUMPECTOMY WITH RADIOACTIVE SEED LOCALIZATION Right 01/18/2021   Procedure: RIGHT BREAST LUMPECTOMY WITH RADIOACTIVE SEED LOCALIZATION X 2;  Surgeon: Curvin Deward MOULD, MD;  Location: [facility] OR;  Service: General;  Laterality: Right;   CERVICAL BIOPSY  W/ LOOP ELECTRODE EXCISION  Age 26 2001   Paps have been normal eversince    COLPOSCOPY     ENDOMETRIAL BIOPSY  09/28/2022   INTRAUTERINE DEVICE (IUD) INSERTION  07/2017   Mirena     ROBOTIC ASSISTED TOTAL HYSTERECTOMY WITH BILATERAL SALPINGO OOPHERECTOMY Bilateral 10/16/2022   Procedure: XI ROBOTIC ASSISTED TOTAL HYSTERECTOMY WITH BILATERAL SALPINGO OOPHORECTOMY;  Surgeon: Lavoie, Marie-Lyne, MD;  Location: [facility];  Service: Gynecology;  Laterality: Bilateral;   SENTINEL NODE BIOPSY Right 02/21/2021   Procedure: RIGHT SENTINEL  NODE BIOPSY;  Surgeon: Curvin Deward MOULD, MD;  Location: [facility];  Service: General;  Laterality: Right;  60 MINUTES ROOM 5   Patient Active Problem List   Diagnosis Date Noted   Sensorineural hearing loss, unilateral, left ear, 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 Diagnostic mammogram and US  showed 3 groups of calcifications in the right breast, largest 1.4cm, with two smaller measuring 0.1cm. 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  She had a right lumpectomy performed on 12/29/20 and is pending a SLNB on 02/21/2021.  She also had a positive genetic test.She will have adjuvant radiation and anti estrogens; hysterectomy Mar 2024  PRECAUTIONS: right UE Lymphedema risk, None  SUBJECTIVE: Pt returns for her 6 month L-Dex screen.   PAIN:  Are you having pain? No  SOZO SCREENING: Patient was assessed today  using the SOZO machine to determine the lymphedema index score. This was compared to her baseline score. It was determined that she is within the recommended range when compared to her baseline and no further action is needed at this time. She will continue SOZO screenings. These are done every 3 months for 2 years post operatively followed by every 6 months for 2 years, and then annually.   L-DEX FLOWSHEETS - 02/17/24 0800       L-DEX LYMPHEDEMA SCREENING   Measurement  Type Unilateral    L-DEX MEASUREMENT EXTREMITY Upper Extremity    POSITION  Standing    DOMINANT SIDE Right    At Risk Side Right    BASELINE SCORE (UNILATERAL) -1.7    L-DEX SCORE (UNILATERAL) 0.3    VALUE CHANGE (UNILAT) 2         P: Cont 6 month L-Dex screens next.   Aden Berwyn Caldron, PTA 02/17/2024, 8:53 AM

## 2024-03-10 ENCOUNTER — Encounter: Payer: Self-pay | Admitting: Internal Medicine

## 2024-03-10 ENCOUNTER — Ambulatory Visit (AMBULATORY_SURGERY_CENTER)

## 2024-03-10 VITALS — Ht 66.0 in | Wt 150.0 lb

## 2024-03-10 DIAGNOSIS — K219 Gastro-esophageal reflux disease without esophagitis: Secondary | ICD-10-CM

## 2024-03-10 DIAGNOSIS — K625 Hemorrhage of anus and rectum: Secondary | ICD-10-CM

## 2024-03-10 NOTE — Progress Notes (Signed)

## 2024-03-20 ENCOUNTER — Encounter: Payer: Self-pay | Admitting: Internal Medicine

## 2024-03-27 ENCOUNTER — Ambulatory Visit: Admitting: Internal Medicine

## 2024-03-27 ENCOUNTER — Other Ambulatory Visit: Payer: Self-pay | Admitting: Internal Medicine

## 2024-03-27 ENCOUNTER — Encounter: Payer: Self-pay | Admitting: Internal Medicine

## 2024-03-27 VITALS — BP 125/84 | HR 77 | Temp 98.1°F | Resp 12

## 2024-03-27 DIAGNOSIS — K644 Residual hemorrhoidal skin tags: Secondary | ICD-10-CM | POA: Diagnosis not present

## 2024-03-27 DIAGNOSIS — K625 Hemorrhage of anus and rectum: Secondary | ICD-10-CM | POA: Diagnosis not present

## 2024-03-27 DIAGNOSIS — K2950 Unspecified chronic gastritis without bleeding: Secondary | ICD-10-CM | POA: Diagnosis not present

## 2024-03-27 DIAGNOSIS — K648 Other hemorrhoids: Secondary | ICD-10-CM | POA: Diagnosis not present

## 2024-03-27 DIAGNOSIS — K5730 Diverticulosis of large intestine without perforation or abscess without bleeding: Secondary | ICD-10-CM | POA: Diagnosis present

## 2024-03-27 DIAGNOSIS — K209 Esophagitis, unspecified without bleeding: Secondary | ICD-10-CM | POA: Diagnosis not present

## 2024-03-27 DIAGNOSIS — K2100 Gastro-esophageal reflux disease with esophagitis, without bleeding: Secondary | ICD-10-CM

## 2024-03-27 DIAGNOSIS — K219 Gastro-esophageal reflux disease without esophagitis: Secondary | ICD-10-CM

## 2024-03-27 MED ORDER — HYDROCORTISONE (PERIANAL) 2.5 % EX CREA: 1.0000 | TOPICAL_CREAM | Freq: Two times a day (BID) | CUTANEOUS | 0 refills | Status: DC

## 2024-03-27 MED ORDER — SODIUM CHLORIDE 0.9 % IV SOLN: 500.0000 mL | Freq: Once | INTRAVENOUS | Status: DC

## 2024-03-27 NOTE — Progress Notes (Signed)
 Sedate, gd SR, tolerated procedure well, VSS, report to RN

## 2024-03-27 NOTE — Progress Notes (Signed)
 Pt's states no medical or surgical changes since previsit or office visit.

## 2024-03-27 NOTE — Op Note (Signed)
 [facility] Patient Name: Ann Buckley Procedure Date: 03/27/2024 10:19 AM MRN: [id number] Endoscopist: Rosario Estefana Kidney , , [id number] Age: 43 Referring MD:  Date of Birth: Jan 07, 1981 Gender: Female Account #: [id number] Procedure:                Colonoscopy Indications:              Rectal bleeding Medicines:                Monitored Anesthesia Care Procedure:                Pre-Anesthesia Assessment:                           - Prior to the procedure, a History and Physical                            was performed, and patient medications and                            allergies were reviewed. The patient's tolerance of                            previous anesthesia was also reviewed. The risks                            and benefits of the procedure and the sedation                            options and risks were discussed with the patient.                            All questions were answered, and informed consent                            was obtained. Prior Anticoagulants: The patient has                            taken no anticoagulant or antiplatelet agents. ASA                            Grade Assessment: II - A patient with mild systemic                            disease. After reviewing the risks and benefits,                            the patient was deemed in satisfactory condition to                            undergo the procedure.                           After obtaining informed consent, the colonoscope  was passed under direct vision. Throughout the                            procedure, the patient's blood pressure, pulse, and                            oxygen saturations were monitored continuously. The                            CF [id number] #[id number] was introduced through the anus                            and advanced to the the terminal ileum. The                            colonoscopy was performed  without difficulty. The                            patient tolerated the procedure well. The quality                            of the bowel preparation was excellent. The                            terminal ileum, ileocecal valve, appendiceal                            orifice, and rectum were photographed. Scope In: 10:35:04 AM Scope Out: 10:47:54 AM Scope Withdrawal Time: 0 hours 10 minutes 7 seconds  Total Procedure Duration: 0 hours 12 minutes 50 seconds  Findings:                 The terminal ileum appeared normal.                           Multiple diverticula were found in the sigmoid                            colon.                           Non-bleeding external and internal hemorrhoids were                            found during retroflexion. Complications:            No immediate complications. Estimated Blood Loss:     Estimated blood loss: none. Impression:               - The examined portion of the ileum was normal.                           - Diverticulosis in the sigmoid colon.                           - Non-bleeding external and internal hemorrhoids.                           -  No specimens collected. Recommendation:           - Discharge patient to home (with escort).                           - Anusol  HC cream BID for 7 days. If this is not                            effective, then will get set up for hemorrhoidal                            banding.                           - The findings and recommendations were discussed                            with the patient. Dr Estefana Federico Rosario Estefana Federico,  03/27/2024 10:54:03 AM

## 2024-03-27 NOTE — Progress Notes (Signed)
 Called to room to assist during endoscopic procedure.  Patient ID and intended procedure confirmed with present staff. Received instructions for my participation in the procedure from the performing physician.

## 2024-03-27 NOTE — Patient Instructions (Signed)
 YOU HAD AN ENDOSCOPIC PROCEDURE TODAY AT THE [facility]:   Refer to the procedure report that was given to you for any specific questions about what was found during the examination.  If the procedure report does not answer your questions, please call your gastroenterologist to clarify.  If you requested that your care partner not be given the details of your procedure findings, then the procedure report has been included in a sealed envelope for you to review at your convenience later.  YOU SHOULD EXPECT: Some feelings of bloating in the abdomen. Passage of more gas than usual.  Walking can help get rid of the air that was put into your GI tract during the procedure and reduce the bloating. If you had a lower endoscopy (such as a colonoscopy or flexible sigmoidoscopy) you may notice spotting of blood in your stool or on the toilet paper. If you underwent a bowel prep for your procedure, you may not have a normal bowel movement for a few days.  Please Note:  You might notice some irritation and congestion in your nose or some drainage.  This is from the oxygen used during your procedure.  There is no need for concern and it should clear up in a day or so.  SYMPTOMS TO REPORT IMMEDIATELY:  Following lower endoscopy (colonoscopy or flexible sigmoidoscopy):  Excessive amounts of blood in the stool  Significant tenderness or worsening of abdominal pains  Swelling of the abdomen that is new, acute  Fever of 100F or higher  Following upper endoscopy (EGD)  Vomiting of blood or coffee ground material  New chest pain or pain under the shoulder blades  Painful or persistently difficult swallowing  New shortness of breath  Fever of 100F or higher  Black, tarry-looking stools  Resume previous diet Anusol  HC cream twice a day for 7 days.  If this is not effective, then will get set up for hemorrhoidal banding Await pathology results   For urgent or emergent issues, a gastroenterologist  can be reached at any hour by calling [phone]. Do not use MyChart messaging for urgent concerns.    DIET:  We do recommend a small meal at first, but then you may proceed to your regular diet.  Drink plenty of fluids but you should avoid alcoholic beverages for 24 hours.  ACTIVITY:  You should plan to take it easy for the rest of today and you should NOT DRIVE or use heavy machinery until tomorrow (because of the sedation medicines used during the test).    FOLLOW UP: Our staff will call the number listed on your records the next business day following your procedure.  We will call around 7:15- 8:00 am to check on you and address any questions or concerns that you may have regarding the information given to you following your procedure. If we do not reach you, we will leave a message.     If any biopsies were taken you will be contacted by phone or by letter within the next 1-3 weeks.  Please call us  at [phone] if you have not heard about the biopsies in 3 weeks.    SIGNATURES/CONFIDENTIALITY: You and/or your care partner have signed paperwork which will be entered into your electronic medical record.  These signatures attest to the fact that that the information above on your After Visit Summary has been reviewed and is understood.  Full responsibility of the confidentiality of this discharge information lies with you and/or your care-partner.

## 2024-03-27 NOTE — Progress Notes (Signed)
 GASTROENTEROLOGY PROCEDURE H&P NOTE   Primary Care Physician: Verdia Lombard, MD    Reason for Procedure:   GERD, rectal bleeding  Plan:    EGD/colonoscopy  Patient is appropriate for endoscopic procedure(s) in the ambulatory ([facility]) setting.  The nature of the procedure, as well as the risks, benefits, and alternatives were carefully and thoroughly reviewed with the patient. Ample time for discussion and questions allowed. The patient understood, was satisfied, and agreed to proceed.     HPI: Ann Buckley is a 43 y.o. female who presents for EGD/colonoscopy for evaluation of GERD, rectal bleeding.  Patient was most recently seen in the Gastroenterology Clinic on 12/18/23.  No interval change in medical history since that appointment. Please refer to that note for full details regarding GI history and clinical presentation.   Past Medical History:  Diagnosis Date   Abnormal Pap smear of cervix 2001   Anxiety    Follows w/ PCP, Dr. Verdia.   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   Family history of breast cancer    PCOS (polycystic ovarian syndrome)    Personal history of radiation therapy    right breast, 04/04/21 - 05/17/21   STD (sexually transmitted disease)    hsv1   Uterine fibroid    Wears contact lenses    Wears glasses     Past Surgical History:  Procedure Laterality Date   BREAST LUMPECTOMY Right 01/18/2021   BREAST LUMPECTOMY WITH RADIOACTIVE SEED LOCALIZATION Right 01/18/2021   Procedure: RIGHT BREAST LUMPECTOMY WITH RADIOACTIVE SEED LOCALIZATION X 2;  Surgeon: Curvin Deward MOULD, MD;  Location: [facility] OR;  Service: General;  Laterality: Right;   CERVICAL BIOPSY  W/ LOOP ELECTRODE EXCISION  Age 61 2001   Paps have been normal eversince    COLPOSCOPY     ENDOMETRIAL BIOPSY  09/28/2022   HYSTERECTOMY, VAGINAL, WITH SALPINGO-OOPHORECTOMY Bilateral 09/2022   INTRAUTERINE DEVICE  (IUD) INSERTION  07/2017   Mirena     ROBOTIC ASSISTED TOTAL HYSTERECTOMY WITH BILATERAL SALPINGO OOPHERECTOMY Bilateral 10/16/2022   Procedure: XI ROBOTIC ASSISTED TOTAL HYSTERECTOMY WITH BILATERAL SALPINGO OOPHORECTOMY;  Surgeon: Lavoie, Marie-Lyne, MD;  Location: [facility];  Service: Gynecology;  Laterality: Bilateral;   SENTINEL NODE BIOPSY Right 02/21/2021   Procedure: RIGHT SENTINEL NODE BIOPSY;  Surgeon: Curvin Deward MOULD, MD;  Location: [facility];  Service: General;  Laterality: Right;  60 MINUTES ROOM 5    Prior to Admission medications   Medication Sig Start Date End Date Taking? Authorizing Provider  ALPRAZolam (XANAX) 0.5 MG tablet Take 1 mg by mouth 2 (two) times daily as needed.    [provider]  Biotin 89999 MCG TABS Take 1 tablet by mouth daily.    [provider]  Cholecalciferol (VITAMIN D3) 50 MCG (2000 UT) TABS Take 1 capsule by mouth daily.    [provider]  fluocinolone (SYNALAR) 0.01 % external solution Apply topically daily as needed. 01/09/22   [provider]  fluticasone (FLONASE) 50 MCG/ACT nasal spray Place into both nostrils as needed for allergies or rhinitis.    [provider]  ketoconazole (NIZORAL) 2 % shampoo Apply 1 Application topically 2 (two) times a week. 01/09/22   [provider]  MAGNESIUM GLYCINATE PO Take by mouth. 1330mg  per capsule  2 capsules daily    [provider]  metFORMIN (GLUCOPHAGE) 500 MG tablet Take 500 mg by mouth 4 (four) times daily.  [provider]  Multiple Vitamin (MULTIVITAMIN PO) Take by mouth.    [provider]  OVER THE COUNTER MEDICATION Calcium: 2 x 1200mg  daily    [provider]  OVER THE COUNTER MEDICATION Berberine : 2 x 500mg  daily Patient not taking: Reported on 03/10/2024    [provider]  OVER THE COUNTER MEDICATION B12 2000 mcg daily    [provider]  Probiotic Product  (PROBIOTIC PO) Take by mouth.    [provider]  tamoxifen  (NOLVADEX ) 10 MG tablet Take 1 tablet (10 mg total) by mouth daily. 11/21/23   Gudena, Vinay, MD  triamcinolone ointment (KENALOG) 0.5 % Apply 1 Application topically daily as needed. 01/30/22   [provider]    Current Outpatient Medications  Medication Sig Dispense Refill   ALPRAZolam (XANAX) 0.5 MG tablet Take 1 mg by mouth 2 (two) times daily as needed.     Biotin 89999 MCG TABS Take 1 tablet by mouth daily.     Cholecalciferol (VITAMIN D3) 50 MCG (2000 UT) TABS Take 1 capsule by mouth daily.     fluocinolone (SYNALAR) 0.01 % external solution Apply topically daily as needed.     fluticasone (FLONASE) 50 MCG/ACT nasal spray Place into both nostrils as needed for allergies or rhinitis.     ketoconazole (NIZORAL) 2 % shampoo Apply 1 Application topically 2 (two) times a week.     MAGNESIUM GLYCINATE PO Take by mouth. 1330mg  per capsule  2 capsules daily     metFORMIN (GLUCOPHAGE) 500 MG tablet Take 500 mg by mouth 4 (four) times daily.     Multiple Vitamin (MULTIVITAMIN PO) Take by mouth.     OVER THE COUNTER MEDICATION Calcium: 2 x 1200mg  daily     OVER THE COUNTER MEDICATION Berberine : 2 x 500mg  daily (Patient not taking: Reported on 03/10/2024)     OVER THE COUNTER MEDICATION B12 2000 mcg daily     Probiotic Product (PROBIOTIC PO) Take by mouth.     tamoxifen  (NOLVADEX ) 10 MG tablet Take 1 tablet (10 mg total) by mouth daily. 90 tablet 3   triamcinolone ointment (KENALOG) 0.5 % Apply 1 Application topically daily as needed.     Current Facility-Administered Medications  Medication Dose Route Frequency Provider Last Rate Last Admin   0.9 %  sodium chloride  infusion  500 mL Intravenous Once Federico Rosario BROCKS, MD        Allergies as of 03/27/2024 - Review Complete 03/27/2024  Allergen Reaction Noted   Other Hives 02/14/2021   Bacitracin Rash 03/10/2024   Penicillins Rash 05/04/2014    Sulfamethoxazole-trimethoprim Rash 07/28/2017   Tape Rash 03/27/2024    Family History  Problem Relation Age of Onset   Diabetes Mother    Hypertension Father    CAD Father    Diabetes Maternal Grandmother    Diabetes Maternal Grandfather    Breast cancer Paternal Grandmother        dx 23s   Heart attack Paternal Grandfather    Colon cancer Neg Hx    Esophageal cancer Neg Hx    Colon polyps Neg Hx    Rectal cancer Neg Hx    Stomach cancer Neg Hx     Social History   Socioeconomic History   Marital status: Married    Spouse name: Not on file   Number of children: 2   Years of education: Not on file   Highest education level: Not on file  Occupational History  Not on file  Tobacco Use   Smoking status: Never   Smokeless tobacco: Never  Vaping Use   Vaping status: Never Used  Substance and Sexual Activity   Alcohol use: Not Currently    Comment: 4 times a week   Drug use: Yes    Types: Marijuana    Comment: occ   Sexual activity: Yes    Partners: Male    Birth control/protection: Surgical    Comment: husband vasectomy, hysterectomy  Other Topics Concern   Not on file  Social History Narrative   Not on file   Social Drivers of Health   Financial Resource Strain: Low Risk  (08/08/2021)   Overall Financial Resource Strain (CARDIA)    Difficulty of Paying Living Expenses: Not hard at all  Food Insecurity: No Food Insecurity (08/08/2021)   Hunger Vital Sign    Worried About Running Out of Food in the Last Year: Never true    Ran Out of Food in the Last Year: Never true  Transportation Needs: No Transportation Needs (08/08/2021)   PRAPARE - Administrator, Civil Service (Medical): No    Lack of Transportation (Non-Medical): No  Physical Activity: Sufficiently Active (08/08/2021)   Exercise Vital Sign    Days of Exercise per Week: 6 days    Minutes of Exercise per Session: 60 min  Stress: No Stress Concern Present (08/08/2021)   [employer] of  Occupational Health - Occupational Stress Questionnaire    Feeling of Stress : Only a little  Social Connections: Moderately Isolated (08/08/2021)   Social Connection and Isolation Panel    Frequency of Communication with Friends and Family: More than three times a week    Frequency of Social Gatherings with Friends and Family: Twice a week    Attends Religious Services: Never    Database administrator or Organizations: No    Attends Banker Meetings: Never    Marital Status: Married  Catering manager Violence: Not At Risk (08/08/2021)   Humiliation, Afraid, Rape, and Kick questionnaire    Fear of Current or Ex-Partner: No    Emotionally Abused: No    Physically Abused: No    Sexually Abused: No    Physical Exam: Vital signs in last 24 hours: BP 117/77   Pulse 70   Temp 98.1 F (36.7 C) (Temporal)   LMP 09/26/2022   SpO2 99%  GEN: NAD EYE: Sclerae anicteric ENT: MMM CV: Non-tachycardic Pulm: No increased WOB GI: Soft NEURO:  Alert & Oriented   Estefana Kidney, MD [facility]   03/27/2024 9:44 AM

## 2024-03-27 NOTE — Op Note (Signed)
  [facility] Patient Name: Ann Buckley Procedure Date: 03/27/2024 10:19 AM MRN: [id number] Endoscopist: Rosario Estefana Kidney , , [id number] Age: 43 Referring MD:  Date of Birth: 1981-04-24 Gender: Female Account #: [id number] Procedure:                Upper GI endoscopy Indications:              Heartburn Medicines:                Monitored Anesthesia Care Procedure:                Pre-Anesthesia Assessment:                           - Prior to the procedure, a History and Physical                            was performed, and patient medications and                            allergies were reviewed. The patient's tolerance of                            previous anesthesia was also reviewed. The risks                            and benefits of the procedure and the sedation                            options and risks were discussed with the patient.                            All questions were answered, and informed consent                            was obtained. Prior Anticoagulants: The patient has                            taken no anticoagulant or antiplatelet agents. ASA                            Grade Assessment: II - A patient with mild systemic                            disease. After reviewing the risks and benefits,                            the patient was deemed in satisfactory condition to                            undergo the procedure.                           After obtaining informed consent, the endoscope was  passed under direct vision. Throughout the                            procedure, the patient's blood pressure, pulse, and                            oxygen saturations were monitored continuously. The                            Olympus Scope [id number] was introduced through the                            mouth, and advanced to the second part of duodenum.                            The upper GI  endoscopy was accomplished without                            difficulty. The patient tolerated the procedure                            well. Scope In: Scope Out: Findings:                 Salmon-colored mucosa was present. The maximum                            longitudinal extent of these esophageal mucosal                            changes was 1 cm in length. Mucosa was biopsied                            with a cold forceps for histology. One specimen                            bottle was sent to pathology.                           Localized mildly erythematous mucosa without                            bleeding was found in the gastric antrum. Biopsies                            were taken with a cold forceps for histology.                           The examined duodenum was normal. Complications:            No immediate complications. Estimated Blood Loss:     Estimated blood loss was minimal. Impression:               - Salmon-colored mucosa. Biopsied.                           -  Erythematous mucosa in the antrum. Biopsied.                           - Normal examined duodenum. Recommendation:           - Await pathology results.                           - Perform a colonoscopy today. Dr Estefana Federico Rosario Estefana Federico,  03/27/2024 10:51:27 AM

## 2024-03-30 ENCOUNTER — Telehealth: Payer: Self-pay

## 2024-03-30 NOTE — Telephone Encounter (Signed)
 Attempted f/u call. No answer, left VM.

## 2024-03-31 ENCOUNTER — Ambulatory Visit: Payer: Self-pay | Admitting: Internal Medicine

## 2024-03-31 LAB — SURGICAL PATHOLOGY

## 2024-04-09 ENCOUNTER — Encounter: Payer: Self-pay | Admitting: Adult Health

## 2024-04-14 ENCOUNTER — Other Ambulatory Visit: Payer: Self-pay

## 2024-04-14 ENCOUNTER — Other Ambulatory Visit: Payer: Self-pay | Admitting: Adult Health

## 2024-04-14 DIAGNOSIS — E2839 Other primary ovarian failure: Secondary | ICD-10-CM

## 2024-04-14 DIAGNOSIS — Z170 Estrogen receptor positive status [ER+]: Secondary | ICD-10-CM

## 2024-04-16 ENCOUNTER — Inpatient Hospital Stay

## 2024-04-16 ENCOUNTER — Inpatient Hospital Stay: Payer: 59 | Attending: Hematology and Oncology | Admitting: Hematology and Oncology

## 2024-04-16 VITALS — BP 120/70 | HR 73 | Temp 98.0°F | Resp 16 | Wt 153.3 lb

## 2024-04-16 DIAGNOSIS — N952 Postmenopausal atrophic vaginitis: Secondary | ICD-10-CM | POA: Insufficient documentation

## 2024-04-16 DIAGNOSIS — Z170 Estrogen receptor positive status [ER+]: Secondary | ICD-10-CM | POA: Insufficient documentation

## 2024-04-16 DIAGNOSIS — C50411 Malignant neoplasm of upper-outer quadrant of right female breast: Secondary | ICD-10-CM | POA: Diagnosis present

## 2024-04-16 DIAGNOSIS — Z79810 Long term (current) use of selective estrogen receptor modulators (SERMs): Secondary | ICD-10-CM | POA: Diagnosis not present

## 2024-04-16 DIAGNOSIS — N951 Menopausal and female climacteric states: Secondary | ICD-10-CM | POA: Insufficient documentation

## 2024-04-16 DIAGNOSIS — Z79899 Other long term (current) drug therapy: Secondary | ICD-10-CM | POA: Insufficient documentation

## 2024-04-16 DIAGNOSIS — F32A Depression, unspecified: Secondary | ICD-10-CM | POA: Insufficient documentation

## 2024-04-16 DIAGNOSIS — F419 Anxiety disorder, unspecified: Secondary | ICD-10-CM | POA: Insufficient documentation

## 2024-04-16 DIAGNOSIS — Z923 Personal history of irradiation: Secondary | ICD-10-CM | POA: Diagnosis not present

## 2024-04-16 DIAGNOSIS — E2839 Other primary ovarian failure: Secondary | ICD-10-CM

## 2024-04-16 MED ORDER — VENLAFAXINE HCL 37.5 MG PO TABS: 37.5000 mg | ORAL_TABLET | Freq: Two times a day (BID) | ORAL | 3 refills | Status: AC

## 2024-04-16 MED ORDER — ESTRADIOL 0.1 MG/GM VA CREA: 1.0000 | TOPICAL_CREAM | VAGINAL | 3 refills | Status: AC

## 2024-04-16 NOTE — Assessment & Plan Note (Signed)
 12/09/2020:Screening mammogram showed right breast calcifications. Diagnostic mammogram and US  showed 3 groups of calcifications in the right breast, largest 1.4cm, with two smaller measuring 0.1cm. 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   Treatment plan: 1. Breast conserving surgery Right lumpectomy: Incidental focus of IDC grade 1, 0.2 cm, foci of DCIS intermediate grade with focal necrosis, margins negative, ER 60%, PR 80%, Ki-67 2%, HER2 negative T1 a N0 stage Ia 2. sentinel lymph node study 02/21/2021: 0/5 lymph nodes negative 3. Followed by adjuvant radiation therapy 04/04/2021-05/17/2021 4. Followed by antiestrogen therapy with tamoxifen  5-10 years started November 2022   5. 10/16/2022: Hysterectomy with bilateral salpingo-oophorectomy  ------------------------------------------------------------------------------------------------------------------------------------------ Tamoxifen  toxicities: Hot flashes: Keeping her up at night Joint stiffness Loss of libido: Referral to pelvic physical therapy   Breast cancer surveillance: 1.  Contrast-enhanced mammogram 10/24/2023: Benign breast density category C 2.  Breast MRI 04/27/2022: Benign breast density category C 3.  Bone density 11/25/2023: T-score -0.2: Normal   Since we are doing contrast-enhanced mammograms, we are no longer doing breast MRIs.   -Plan for Breast Cancer Index test at 5-year mark.   Menopausal Symptoms Return to clinic in 1 year for follow-up

## 2024-04-16 NOTE — Progress Notes (Signed)
 Patient Care Team: Verdia Lombard, MD as PCP - General (Internal Medicine) Curvin Deward MOULD, MD as Consulting Physician (General Surgery) Odean Potts, MD as Consulting Physician (Hematology and Oncology) Dewey Rush, MD as Consulting Physician (Radiation Oncology) Prentiss Annabella LABOR, NP as Nurse Practitioner (Gynecology)  DIAGNOSIS:  Encounter Diagnosis  Name Primary?   Malignant neoplasm of upper-outer quadrant of right breast in female, estrogen receptor positive (HCC) Yes    SUMMARY OF ONCOLOGIC HISTORY: Oncology History  Malignant neoplasm of upper outer quadrant of female breast (HCC)  12/09/2020 Initial Diagnosis   Screening mammogram showed right breast calcifications. Diagnostic mammogram and US  showed 3 groups of calcifications in the right breast, largest 1.4cm, with two smaller measuring 0.1cm. 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 Testing was completed through the BRCAplus and CancerNext-Expanded + RNAinsight panels offered by [facility] laboratories. The report date is 12/28/2020.   The BRCAplus panel offered by [facility] and includes sequencing and deletion/duplication analysis for the following 8 genes: ATM, BRCA1, BRCA2, CDH1, CHEK2, PALB2, PTEN, and TP53. The CancerNext-Expanded + RNAinsight gene panel offered by [facility] and includes sequencing and rearrangement analysis for the following 77 genes: AIP, ALK, APC, ATM, AXIN2, BAP1, BARD1, BLM, BMPR1A, BRCA1, BRCA2, BRIP1, CDC73, CDH1, CDK4, CDKN1B,  CDKN2A, CHEK2, CTNNA1, DICER1, FANCC, FH, FLCN, GALNT12, KIF1B, LZTR1, MAX, MEN1, MET, MLH1, MSH2, MSH3, MSH6, MUTYH, NBN, NF1, NF2, NTHL1, PALB2, PHOX2B, PMS2, POT1, PRKAR1A, PTCH1, PTEN, RAD51C, RAD51D, RB1, RECQL, RET, SDHA, SDHAF2, SDHB, SDHC, SDHD, SMAD4, SMARCA4, SMARCB1, SMARCE1, STK11, SUFU, TMEM127, TP53, TSC1, TSC2, VHL and XRCC2 (sequencing and deletion/duplication); EGFR, EGLN1, HOXB13, KIT, MITF, PDGFRA, POLD1 and POLE (sequencing only); EPCAM and GREM1 (deletion/duplication only). RNA data is routinely analyzed for use in variant interpretation for all genes.   01/18/2021 Surgery   Right lumpectomy: Incidental focus of IDC grade 1, 0.2 cm, foci of DCIS intermediate grade with focal necrosis, margins negative, ER 60%, PR 80% T1 a N0 stage Ia   01/18/2021 Cancer Staging   Staging form: Breast, AJCC 8th Edition - Pathologic stage from 01/18/2021: Stage IA (pT1a, pN0, cM0, G1, ER+, PR+, HER2-) - Signed by Crawford Morna Pickle, NP on 07/28/2021 Stage prefix: Initial diagnosis Histologic grading system: 3 grade system   04/03/2021 - 05/17/2021 Radiation Therapy   Site Technique Total Dose (Gy) Dose per Fx (Gy) Completed Fx Beam Energies  Breast, Right: Breast_Rt 3D 50.4/50.4 1.8 28/28 6X  Breast, Right: Breast_Rt_Bst 3D 10/10 2 5/5 6X     06/11/2021 -  Anti-estrogen oral therapy   Tamoxifen , started at half dose, increased to full dose 06/25/21     CHIEF COMPLIANT:   HISTORY OF PRESENT ILLNESS: Discussed the use of AI scribe software for clinical note transcription with the patient, who gave verbal consent to proceed.  History of Present Illness Ann Buckley is a 43 year old female with a history of breast cancer and menopause who presents with depression and anxiety.  She experiences significant menopausal symptoms, including hot flashes and sleeplessness, managed with Valerian root. Despite an oophorectomy, these symptoms  persist. She takes tamoxifen , initially at  20 mg daily, now reduced to 10 mg daily, which she believes has improved her symptoms.  She has a recent onset of depression and anxiety, feeling overwhelmed and experiencing difficulty with daily activities. These symptoms affect her marriage and daily functioning. She is currently on a break from work and seeking a remote full-time job.  She had COVID-19 in April, contributing to fatigue, mental fog, and forgetfulness, which are distressing given her previous mental acuity. She expresses concern about her long-term health, particularly regarding bone and heart health, due to estrogen depletion.     ALLERGIES:  is allergic to other, bacitracin, penicillins, sulfamethoxazole-trimethoprim, and tape.  MEDICATIONS:  Current Outpatient Medications  Medication Sig Dispense Refill   ALPRAZolam (XANAX) 0.5 MG tablet Take 1 mg by mouth 2 (two) times daily as needed.     Biotin 89999 MCG TABS Take 1 tablet by mouth daily.     Cholecalciferol (VITAMIN D3) 50 MCG (2000 UT) TABS Take 1 capsule by mouth daily.     fluocinolone (SYNALAR) 0.01 % external solution Apply topically daily as needed.     fluticasone (FLONASE) 50 MCG/ACT nasal spray Place into both nostrils as needed for allergies or rhinitis.     ketoconazole (NIZORAL) 2 % shampoo Apply 1 Application topically 2 (two) times a week.     MAGNESIUM GLYCINATE PO Take by mouth. 1330mg  per capsule  2 capsules daily     metFORMIN (GLUCOPHAGE) 500 MG tablet Take 500 mg by mouth 4 (four) times daily.     Multiple Vitamin (MULTIVITAMIN PO) Take by mouth.     OVER THE COUNTER MEDICATION Calcium: 2 x 1200mg  daily     OVER THE COUNTER MEDICATION Berberine : 2 x 500mg  daily (Patient not taking: No sig reported)     OVER THE COUNTER MEDICATION B12 2000 mcg daily     Probiotic Product (PROBIOTIC PO) Take by mouth.     tamoxifen  (NOLVADEX ) 10 MG tablet Take 1 tablet (10 mg total) by mouth daily. 90 tablet 3   triamcinolone ointment (KENALOG) 0.5 % Apply 1  Application topically daily as needed.     No current facility-administered medications for this visit.    PHYSICAL EXAMINATION: ECOG PERFORMANCE STATUS: 1 - Symptomatic but completely ambulatory  Vitals:   04/16/24 0809  BP: 120/70  Pulse: 73  Resp: 16  Temp: 98 F (36.7 C)  SpO2: 100%   Filed Weights   04/16/24 0809  Weight: 153 lb 4.8 oz (69.5 kg)    Physical Exam   (exam performed in the presence of a chaperone)  LABORATORY DATA:  I have reviewed the data as listed    Latest Ref Rng & Units 10/10/2022    8:56 AM 01/11/2021    9:19 AM 12/14/2020   12:18 PM  CMP  Glucose 70 - 99 mg/dL 90  84  869   BUN 6 - 20 mg/dL 15  9  10    Creatinine 0.44 - 1.00 mg/dL 9.25  9.22  9.23   Sodium 135 - 145 mmol/L 138  136  140   Potassium 3.5 - 5.1 mmol/L 3.9  4.1  3.9   Chloride 98 - 111 mmol/L 108  105  106   CO2 22 - 32 mmol/L 23  24  26    Calcium 8.9 - 10.3 mg/dL 9.1  9.6  9.5   Total Protein 6.5 - 8.1 g/dL  7.6  7.4   Total Bilirubin 0.3 - 1.2  mg/dL  1.1  0.6   Alkaline Phos 38 - 126 U/L  32  36   AST 15 - 41 U/L  19  16   ALT 0 - 44 U/L  15  10     Lab Results  Component Value Date   WBC 13.1 (H) 10/16/2022   HGB 13.1 10/16/2022   HCT 40.7 10/16/2022   MCV 100.5 (H) 10/16/2022   PLT 315 10/16/2022   NEUTROABS 4.5 12/14/2020    ASSESSMENT & PLAN:  Malignant neoplasm of upper outer quadrant of female breast (HCC) 12/09/2020:Screening mammogram showed right breast calcifications. Diagnostic mammogram and US  showed 3 groups of calcifications in the right breast, largest 1.4cm, with two smaller measuring 0.1cm. 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   Treatment plan: 1. Breast conserving surgery Right lumpectomy: Incidental focus of IDC grade 1, 0.2 cm, foci of DCIS intermediate grade with focal necrosis, margins negative, ER 60%, PR 80%, Ki-67 2%, HER2 negative T1 a N0 stage Ia 2. sentinel lymph node study 02/21/2021: 0/5 lymph nodes negative 3.  Followed by adjuvant radiation therapy 04/04/2021-05/17/2021 4. Followed by antiestrogen therapy with tamoxifen  5-10 years started November 2022   5. 10/16/2022: Hysterectomy with bilateral salpingo-oophorectomy  ------------------------------------------------------------------------------------------------------------------------------------------ Tamoxifen  toxicities: Hot flashes: Keeping her up at night Joint stiffness Loss of libido: Referral to pelvic physical therapy   Breast cancer surveillance: 1.  Contrast-enhanced mammogram 10/24/2023: Benign breast density category C 2.  Breast MRI 04/27/2022: Benign breast density category C 3.  Bone density 11/25/2023: T-score -0.2: Normal   Since we are doing contrast-enhanced mammograms, we are no longer doing breast MRIs.   -Plan for Breast Cancer Index test at 5-year mark.   Menopausal Symptoms Hot flashes: Encouraged to decrease tamoxifen  10 mg every other day Severe emotional distress with anxiety and depression: Affecting her personal life and marriage.  I sent a prescription for Effexor .  She is under marriage counseling. Vaginal dryness: I sent a prescription for Estrace  cream.  To apply it 3 times a week a pea-sized amount.  I did not recommend estrogen patches.  Telephone visit in 2 months to assess her mental health symptoms. If necessary we can increase Effexor  at that time. I placed a new order for contrast-enhanced mammogram.   Assessment & Plan ER-positive right breast cancer, status post treatment Three years post-treatment, on tamoxifen  10 mg daily. Discussed reducing to 10 mg every other day due to initial DCIS diagnosis. TAMO-1 trial supports lower doses. Awaiting breast cancer index test at year five for discontinuation decision. - Continue tamoxifen  10 mg every other day. - Plan for breast cancer index test at year five to assess the need for continued tamoxifen .  Menopausal symptoms secondary to cancer  therapy Experiences hot flashes, sleep disturbances, and weight gain. Valerian root provides relief. Effexor  recommended for hot flashes, minimal side effects. - Prescribe Effexor  37.5 mg daily for menopausal symptoms and mood stabilization. - Continue Valerian root as needed for sleep and hot flashes.  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 Effexor  recommended for mood stabilization. In marital therapy. - Prescribe Effexor  37.5 mg daily for depression and anxiety. - Continue marital therapy and consider additional counseling if needed.  Vaginal atrophy with dryness due to menopause Experiences vaginal atrophy and dryness. Vaginal estrogen cream recommended for symptom relief. - Prescribe vaginal estrogen cream, apply a pea-sized amount twice a week.  Cardiac risk assessment Family history  of CAD, right bundle branch block on EKG. Discussed cardiac risk from tamoxifen  and estrogen depletion. Coronary calcium score test suggested. - Discuss coronary calcium score test with primary care physician for further cardiac risk assessment.      No orders of the defined types were placed in this encounter.  The patient has a good understanding of the overall plan. she agrees with it. she will call with any problems that may develop before the next visit here. Total time spent: 30 mins including face to face time and time spent for planning, charting and co-ordination of care   Viinay K Baran Kuhrt, MD 04/16/24

## 2024-04-17 LAB — FOLLICLE STIMULATING HORMONE: FSH: 58.6 m[IU]/mL

## 2024-04-17 LAB — ESTRADIOL: Estradiol: <5 pg/mL

## 2024-04-17 LAB — LUTEINIZING HORMONE: LH: 33.5 m[IU]/mL

## 2024-04-22 ENCOUNTER — Other Ambulatory Visit: Payer: Self-pay | Admitting: *Deleted

## 2024-04-22 DIAGNOSIS — Z170 Estrogen receptor positive status [ER+]: Secondary | ICD-10-CM

## 2024-04-22 NOTE — Progress Notes (Signed)
 [employer] renewal orders placed per MD request.

## 2024-04-23 ENCOUNTER — Other Ambulatory Visit: Payer: Self-pay

## 2024-04-23 DIAGNOSIS — Z170 Estrogen receptor positive status [ER+]: Secondary | ICD-10-CM

## 2024-04-23 DIAGNOSIS — E2839 Other primary ovarian failure: Secondary | ICD-10-CM

## 2024-04-24 LAB — MISC LABCORP TEST (SEND OUT): Labcorp test code: 4564

## 2024-04-29 ENCOUNTER — Other Ambulatory Visit: Payer: Self-pay | Admitting: Internal Medicine

## 2024-04-30 ENCOUNTER — Other Ambulatory Visit: Payer: Self-pay

## 2024-04-30 ENCOUNTER — Encounter: Payer: Self-pay | Admitting: Internal Medicine

## 2024-04-30 MED ORDER — HYDROCORTISONE (PERIANAL) 2.5 % EX CREA: 1.0000 | TOPICAL_CREAM | Freq: Two times a day (BID) | CUTANEOUS | 1 refills | Status: AC

## 2024-05-24 LAB — SIGNATERA ONLY (NATERA MANAGED)
SIGNATERA MTM READOUT: 0 MTM/ml
SIGNATERA TEST RESULT: NEGATIVE

## 2024-06-16 ENCOUNTER — Inpatient Hospital Stay: Attending: Hematology and Oncology | Admitting: Hematology and Oncology

## 2024-06-16 DIAGNOSIS — Z170 Estrogen receptor positive status [ER+]: Secondary | ICD-10-CM

## 2024-06-16 DIAGNOSIS — C50411 Malignant neoplasm of upper-outer quadrant of right female breast: Secondary | ICD-10-CM | POA: Diagnosis not present

## 2024-06-16 NOTE — Assessment & Plan Note (Signed)
 12/09/2020:Screening mammogram showed right breast calcifications. Diagnostic mammogram and US  showed 3 groups of calcifications in the right breast, largest 1.4cm, with two smaller measuring 0.1cm. 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   Treatment plan: 1. Breast conserving surgery Right lumpectomy: Incidental focus of IDC grade 1, 0.2 cm, foci of DCIS intermediate grade with focal necrosis, margins negative, ER 60%, PR 80%, Ki-67 2%, HER2 negative T1 a N0 stage Ia 2. sentinel lymph node study 02/21/2021: 0/5 lymph nodes negative 3. Followed by adjuvant radiation therapy 04/04/2021-05/17/2021 4. Followed by antiestrogen therapy with tamoxifen  5-10 years started November 2022   5. 10/16/2022: Hysterectomy with bilateral salpingo-oophorectomy  ------------------------------------------------------------------------------------------------------------------------------------------ Tamoxifen  toxicities: Hot flashes: Keeping her up at night Joint stiffness Loss of libido: Referral to pelvic physical therapy   Breast cancer surveillance: 1.  Contrast-enhanced mammogram 10/24/2023: Benign breast density category C 2.  Breast MRI 04/27/2022: Benign breast density category C 3.  Bone density 11/25/2023: T-score -0.2: Normal   Since we are doing contrast-enhanced mammograms, we are no longer doing breast MRIs.   -Plan for Breast Cancer Index test at 5-year mark.   Menopausal Symptoms Hot flashes: Encouraged to decrease tamoxifen  10 mg every other day Severe emotional distress with anxiety and depression: Affecting her personal life and marriage.  I sent a prescription for Effexor .  She is under marriage counseling. Vaginal dryness: I sent a prescription for Estrace  cream.  To apply it 3 times a week a pea-sized amount.  I did not recommend estrogen patches.   Telephone visit in 2 months to assess her mental health symptoms. If necessary we can increase Effexor  at that time. I placed a  new order for contrast-enhanced mammogram.

## 2024-06-16 NOTE — Progress Notes (Signed)
 HEMATOLOGY-ONCOLOGY TELEPHONE VISIT PROGRESS NOTE  I connected with our patient on 06/16/24 at 10:00 AM EST by telephone and verified that I am speaking with the correct person using two identifiers.  I discussed the limitations, risks, security and privacy concerns of performing an evaluation and management service by telephone and the availability of in person appointments.  I also discussed with the patient that there may be a patient responsible charge related to this service. The patient expressed understanding and agreed to proceed.   History of Present Illness: Follow-up to review Effexor  related health changes  History of Present Illness Ann Buckley is a 43 year old female who presents for follow-up regarding her hot flashes and energy levels along with anxiety and depression symptoms.  Her hot flashes have markedly improved on the Effexor . After titrating to two pills daily, she has had no noted side effects and reports complete resolution of hot flashes with high satisfaction.  She receives weekly vitamin B12 injections with improved energy and no reported adverse effects.    Oncology History  Malignant neoplasm of upper outer quadrant of female breast (HCC)  12/09/2020 Initial Diagnosis   Screening mammogram showed right breast calcifications. Diagnostic mammogram and US  showed 3 groups of calcifications in the right breast, largest 1.4cm, with two smaller measuring 0.1cm. 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  Testing was completed through the BRCAplus and CancerNext-Expanded + RNAinsight panels offered by [facility] laboratories. The report date is 12/28/2020.   The BRCAplus panel offered by [facility] and includes sequencing and deletion/duplication analysis for the following 8 genes: ATM, BRCA1, BRCA2, CDH1, CHEK2, PALB2, PTEN, and TP53. The CancerNext-Expanded + RNAinsight gene panel offered by [facility] and includes sequencing and rearrangement analysis for the following 77 genes: AIP, ALK, APC, ATM, AXIN2, BAP1, BARD1, BLM, BMPR1A, BRCA1, BRCA2, BRIP1, CDC73, CDH1, CDK4, CDKN1B, CDKN2A, CHEK2, CTNNA1, DICER1, FANCC, FH, FLCN, GALNT12, KIF1B, LZTR1, MAX, MEN1, MET, MLH1, MSH2, MSH3, MSH6, MUTYH, NBN, NF1, NF2, NTHL1, PALB2, PHOX2B, PMS2, POT1, PRKAR1A, PTCH1, PTEN, RAD51C, RAD51D, RB1, RECQL, RET, SDHA, SDHAF2, SDHB, SDHC, SDHD, SMAD4, SMARCA4, SMARCB1, SMARCE1, STK11, SUFU, TMEM127, TP53, TSC1, TSC2, VHL and XRCC2 (sequencing and deletion/duplication); EGFR, EGLN1, HOXB13, KIT, MITF, PDGFRA, POLD1 and POLE (sequencing only); EPCAM and GREM1 (deletion/duplication only). RNA data is routinely analyzed for use in variant interpretation for all genes.   01/18/2021 Surgery   Right lumpectomy: Incidental focus of IDC grade 1, 0.2 cm, foci of DCIS intermediate grade with focal necrosis, margins negative, ER 60%, PR 80% T1 a N0 stage Ia   01/18/2021 Cancer Staging   Staging form: Breast, AJCC 8th Edition - Pathologic stage from 01/18/2021: Stage IA (pT1a, pN0, cM0, G1, ER+, PR+, HER2-) - Signed by Crawford Morna Pickle, NP on 07/28/2021 Stage prefix: Initial diagnosis Histologic grading system: 3 grade system   04/03/2021 - 05/17/2021 Radiation Therapy   Site Technique Total Dose (Gy) Dose per Fx (Gy) Completed Fx Beam Energies  Breast, Right: Breast_Rt 3D 50.4/50.4 1.8 28/28 6X  Breast, Right: Breast_Rt_Bst 3D 10/10 2 5/5 6X  06/11/2021 -  Anti-estrogen oral therapy   Tamoxifen , started at  half dose, increased to full dose 06/25/21     REVIEW OF SYSTEMS:   Constitutional: Denies fevers, chills or abnormal weight loss All other systems were reviewed with the patient and are negative. Observations/Objective:     Assessment Plan:  Malignant neoplasm of upper outer quadrant of female breast (HCC) 12/09/2020:Screening mammogram showed right breast calcifications. Diagnostic mammogram and US  showed 3 groups of calcifications in the right breast, largest 1.4cm, with two smaller measuring 0.1cm. 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   Treatment plan: 1. Breast conserving surgery Right lumpectomy: Incidental focus of IDC grade 1, 0.2 cm, foci of DCIS intermediate grade with focal necrosis, margins negative, ER 60%, PR 80%, Ki-67 2%, HER2 negative T1 a N0 stage Ia 2. sentinel lymph node study 02/21/2021: 0/5 lymph nodes negative 3. Followed by adjuvant radiation therapy 04/04/2021-05/17/2021 4. Followed by antiestrogen therapy with tamoxifen  5-10 years started November 2022   5. 10/16/2022: Hysterectomy with bilateral salpingo-oophorectomy  ------------------------------------------------------------------------------------------------------------------------------------------   Breast cancer surveillance: 1.  Contrast-enhanced mammogram 10/24/2023: Benign breast density category C 2.  Breast MRI 04/27/2022: Benign breast density category C 3.  Bone density 11/25/2023: T-score -0.2: Normal   Since we are doing contrast-enhanced mammograms, we are no longer doing breast MRIs.   -Plan for Breast Cancer Index test at 5-year mark.   Menopausal Symptoms Hot flashes: Encouraged to decrease tamoxifen  10 mg every other day.  Hot flashes resolved with the Effexor  Severe emotional distress with anxiety and depression: Affecting her personal life and marriage. Effexor  has helped tremendously and she no longer has medical issues or anxiety or depression anymore. Vaginal dryness:  I sent a prescription for Estrace  cream.  To apply it 3 times a week a pea-sized amount.  I did not recommend estrogen patches. Low energy levels: Patient received B12 injections from her functional doctor and she feels remarkably well.   I placed a new order for contrast-enhanced mammogram. Return to clinic in 1 year for follow-up    I discussed the assessment and treatment plan with the patient. The patient was provided an opportunity to ask questions and all were answered. The patient agreed with the plan and demonstrated an understanding of the instructions. The patient was advised to call back or seek an in-person evaluation if the symptoms worsen or if the condition fails to improve as anticipated.   I provided 20 minutes of non-face-to-face time during this encounter.  This includes time for charting and coordination of care   Naomi MARLA Chad, MD

## 2024-07-07 ENCOUNTER — Other Ambulatory Visit: Payer: Self-pay | Admitting: Urology

## 2024-07-07 DIAGNOSIS — Z1289 Encounter for screening for malignant neoplasm of other sites: Secondary | ICD-10-CM

## 2024-08-03 ENCOUNTER — Ambulatory Visit

## 2024-08-04 ENCOUNTER — Encounter: Payer: Self-pay | Admitting: Nurse Practitioner

## 2024-08-04 ENCOUNTER — Ambulatory Visit: Admitting: Nurse Practitioner

## 2024-08-04 ENCOUNTER — Other Ambulatory Visit (HOSPITAL_COMMUNITY)
Admission: RE | Admit: 2024-08-04 | Discharge: 2024-08-04 | Disposition: A | Discharge status: Home / Self Care | Source: Ambulatory Visit | Attending: Nurse Practitioner | Admitting: Nurse Practitioner

## 2024-08-04 VITALS — BP 112/70 | HR 81 | Ht 65.75 in | Wt 158.0 lb

## 2024-08-04 DIAGNOSIS — Z01419 Encounter for gynecological examination (general) (routine) without abnormal findings: Secondary | ICD-10-CM | POA: Insufficient documentation

## 2024-08-04 DIAGNOSIS — N898 Other specified noninflammatory disorders of vagina: Secondary | ICD-10-CM

## 2024-08-04 DIAGNOSIS — Z1272 Encounter for screening for malignant neoplasm of vagina: Secondary | ICD-10-CM | POA: Diagnosis present

## 2024-08-04 DIAGNOSIS — C50411 Malignant neoplasm of upper-outer quadrant of right female breast: Secondary | ICD-10-CM | POA: Diagnosis not present

## 2024-08-04 DIAGNOSIS — E8941 Symptomatic postprocedural ovarian failure: Secondary | ICD-10-CM | POA: Diagnosis not present

## 2024-08-04 DIAGNOSIS — Z1331 Encounter for screening for depression: Secondary | ICD-10-CM | POA: Diagnosis not present

## 2024-08-04 DIAGNOSIS — Z170 Estrogen receptor positive status [ER+]: Secondary | ICD-10-CM | POA: Diagnosis not present

## 2024-08-04 NOTE — Progress Notes (Signed)
 "  Ann Buckley 1980/10/24 [id number]   History:  44 y.o. G0 presents for annual exam. Robotic assisted total hysterectomy with bilateral salpingo oophorectomy on 10/16/22 for pelvic pain and fibroids. 2001 LEEP, subsequent paps normal. 12/2020 right DCIS hormone reception positive breast cancer managed with lumpectomy, radiation and Tamoxifen  (started 05/2021) and taking every other day. Vaginal estrogen for dryness. Effexor  for depression, also helping with hot flashes. Thyroid  ultrasound 07/2023 showed multiple small nodules, no follow up recommended.   Gynecologic History Patient's last menstrual period was 09/26/2022.   Contraception/Family planning: vasectomy Sexually active: Yes  Health Maintenance Last Pap: 06/28/2021. Results were: Normal Last mammogram: 10/24/2023. Results were: Normal Last colonoscopy: 03/27/2024. Results were: Normal Last Dexa: 11/25/2023. Results were: Normal     08/04/2024    3:15 PM  Depression screen PHQ 2/9  Decreased Interest 0  Down, Depressed, Hopeless 0  PHQ - 2 Score 0     Past medical history, past surgical history, family history and social history were all reviewed and documented in the EPIC chart. 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 20 yo stepson.   ROS:  A ROS was performed and pertinent positives and negatives are included.  Exam:  Vitals:   08/04/24 1511  BP: 112/70  Pulse: 81  SpO2: 99%  Weight: 158 lb (71.7 kg)  Height: 5' 5.75 (1.67 m)     Body mass index is 25.7 kg/m.  General appearance:  Normal Thyroid :  Symmetrical, small bilateral nodules Respiratory  Auscultation:  Clear without wheezing or rhonchi Cardiovascular  Auscultation:  Regular rate, without rubs, murmurs or gallops  Edema/varicosities:  Not grossly evident Abdominal  Soft,nontender, without masses, guarding or rebound.  Liver/spleen:  No organomegaly noted  Hernia:  None appreciated  Skin  Inspection:  Grossly normal Breasts:  Examined lying and sitting.   Right: Without masses, retractions, nipple discharge or axillary adenopathy.   Left: Without masses, retractions, nipple discharge or axillary adenopathy. Pelvic: External genitalia:  no lesions              Urethra:  normal appearing urethra with no masses, tenderness or lesions              Bartholins and Skenes: normal                 Vagina: normal appearing vagina with normal color and discharge, no lesions              Cervix: absent Bimanual Exam:  Uterus:  absent              Adnexa: no mass, fullness, tenderness              Rectovaginal: Deferred              Anus:  normal, no lesions  Ann Buckley, CMA present as chaperone.    Assessment/Plan:  44 y.o. G0 for annual exam.   Well female exam with routine gynecological exam - Education provided on SBEs, importance of preventative screenings, current guidelines, high calcium diet, regular exercise, and multivitamin daily.  Labs with PCP.   Depression screening - PHQ - 0  Malignant neoplasm of upper-outer quadrant of right breast in female, estrogen receptor positive (HCC) - 12/2020 right DCIS hormone reception positive breast cancer managed with lumpectomy, radiation and Tamoxifen  (started 05/2021), taking every other day. UTD on breast screenings.   Vaginal dryness - using vaginal estrogen twice weekly. Prescribed by oncology. Does not need refills.   Screening  for vaginal cancer - Plan: Cytology - PAP( [facility]). 2001 LEEP - no dysplasia. If normal can stop screenings per guidelines.   Symptomatic postsurgical menopause - Effexor  for depression and hot flashes. Good management.   Return in about 1 year (around 08/04/2025) for Annual.      [name] DNP, 3:25 PM 08/04/2024 "

## 2024-08-05 LAB — CYTOLOGY - PAP
Comment: NEGATIVE
Diagnosis: NEGATIVE
High risk HPV: NEGATIVE

## 2024-08-06 ENCOUNTER — Ambulatory Visit: Payer: Self-pay | Admitting: Nurse Practitioner

## 2024-08-26 ENCOUNTER — Encounter (HOSPITAL_BASED_OUTPATIENT_CLINIC_OR_DEPARTMENT_OTHER): Payer: Self-pay | Admitting: Cardiology

## 2024-08-26 ENCOUNTER — Encounter (HOSPITAL_BASED_OUTPATIENT_CLINIC_OR_DEPARTMENT_OTHER): Payer: Self-pay

## 2024-08-26 ENCOUNTER — Ambulatory Visit (INDEPENDENT_AMBULATORY_CARE_PROVIDER_SITE_OTHER): Admitting: Cardiology

## 2024-08-26 VITALS — BP 108/74 | HR 80 | Ht 66.0 in | Wt 159.8 lb

## 2024-08-26 DIAGNOSIS — C50411 Malignant neoplasm of upper-outer quadrant of right female breast: Secondary | ICD-10-CM

## 2024-08-26 DIAGNOSIS — Z170 Estrogen receptor positive status [ER+]: Secondary | ICD-10-CM

## 2024-08-26 DIAGNOSIS — Z8249 Family history of ischemic heart disease and other diseases of the circulatory system: Secondary | ICD-10-CM

## 2024-08-26 DIAGNOSIS — Z7189 Other specified counseling: Secondary | ICD-10-CM

## 2024-08-26 NOTE — Patient Instructions (Signed)
 Medication Instructions:  No changes *If you need a refill on your cardiac medications before your next appointment, please call your pharmacy*  Lab Work: Today: [facility]  If you have labs (blood work) drawn today and your tests are completely normal, you will receive your results only by: MyChart Message (if you have MyChart) OR A paper copy in the mail If you have any lab test that is abnormal or we need to change your treatment, we will call you to review the results.  Testing/Procedures: none  Follow-Up: As needed

## 2025-04-19 ENCOUNTER — Other Ambulatory Visit

## 2025-04-19 ENCOUNTER — Ambulatory Visit: Admitting: Hematology and Oncology
